# Patient Record
Sex: Male | Born: 1970 | Race: White | Hispanic: No | Marital: Married | Smoking: Current every day smoker
Health system: Southern US, Community
[De-identification: ages and names within clinical notes are randomized; demographics above are authoritative.]

## PROBLEM LIST (undated history)

## (undated) DIAGNOSIS — L309 Dermatitis, unspecified: Secondary | ICD-10-CM

## (undated) DIAGNOSIS — T1491XA Suicide attempt, initial encounter: Secondary | ICD-10-CM

## (undated) DIAGNOSIS — K859 Acute pancreatitis without necrosis or infection, unspecified: Secondary | ICD-10-CM

## (undated) DIAGNOSIS — S82201B Unspecified fracture of shaft of right tibia, initial encounter for open fracture type I or II: Secondary | ICD-10-CM

## (undated) DIAGNOSIS — Z720 Tobacco use: Secondary | ICD-10-CM

## (undated) DIAGNOSIS — Y249XXA Unspecified firearm discharge, undetermined intent, initial encounter: Secondary | ICD-10-CM

## (undated) DIAGNOSIS — Z89519 Acquired absence of unspecified leg below knee: Secondary | ICD-10-CM

## (undated) DIAGNOSIS — F419 Anxiety disorder, unspecified: Secondary | ICD-10-CM

## (undated) DIAGNOSIS — E119 Type 2 diabetes mellitus without complications: Secondary | ICD-10-CM

## (undated) DIAGNOSIS — W3400XA Accidental discharge from unspecified firearms or gun, initial encounter: Secondary | ICD-10-CM

## (undated) DIAGNOSIS — I10 Essential (primary) hypertension: Secondary | ICD-10-CM

## (undated) DIAGNOSIS — S81041A Puncture wound with foreign body, right knee, initial encounter: Secondary | ICD-10-CM

## (undated) DIAGNOSIS — T79A29A Traumatic compartment syndrome of unspecified lower extremity, initial encounter: Secondary | ICD-10-CM

## (undated) HISTORY — PX: BACK SURGERY: SHX140

## (undated) HISTORY — PX: BELOW KNEE LEG AMPUTATION: SUR23

## (undated) HISTORY — PX: OTHER SURGICAL HISTORY: SHX169

---

## 2005-10-11 HISTORY — PX: SPINAL CORD STIMULATOR INSERTION: SHX5378

## 2014-03-30 ENCOUNTER — Emergency Department (HOSPITAL_COMMUNITY)
Admission: EM | Admit: 2014-03-30 | Discharge: 2014-03-30 | Disposition: A | Discharge status: Home / Self Care | Payer: Medicare Other | Attending: Emergency Medicine | Admitting: Emergency Medicine

## 2014-03-30 ENCOUNTER — Encounter (HOSPITAL_COMMUNITY): Payer: Self-pay | Admitting: Emergency Medicine

## 2014-03-30 ENCOUNTER — Emergency Department (HOSPITAL_COMMUNITY): Payer: Medicare Other

## 2014-03-30 DIAGNOSIS — E119 Type 2 diabetes mellitus without complications: Secondary | ICD-10-CM | POA: Insufficient documentation

## 2014-03-30 DIAGNOSIS — F17200 Nicotine dependence, unspecified, uncomplicated: Secondary | ICD-10-CM | POA: Diagnosis not present

## 2014-03-30 DIAGNOSIS — R599 Enlarged lymph nodes, unspecified: Secondary | ICD-10-CM | POA: Diagnosis not present

## 2014-03-30 DIAGNOSIS — Z79899 Other long term (current) drug therapy: Secondary | ICD-10-CM | POA: Insufficient documentation

## 2014-03-30 DIAGNOSIS — I10 Essential (primary) hypertension: Secondary | ICD-10-CM | POA: Diagnosis not present

## 2014-03-30 DIAGNOSIS — Z87828 Personal history of other (healed) physical injury and trauma: Secondary | ICD-10-CM | POA: Diagnosis not present

## 2014-03-30 DIAGNOSIS — M545 Low back pain, unspecified: Secondary | ICD-10-CM | POA: Diagnosis present

## 2014-03-30 DIAGNOSIS — M5430 Sciatica, unspecified side: Secondary | ICD-10-CM | POA: Insufficient documentation

## 2014-03-30 HISTORY — DX: Type 2 diabetes mellitus without complications: E11.9

## 2014-03-30 HISTORY — DX: Acquired absence of unspecified leg below knee: Z89.519

## 2014-03-30 HISTORY — DX: Unspecified firearm discharge, undetermined intent, initial encounter: Y24.9XXA

## 2014-03-30 HISTORY — DX: Accidental discharge from unspecified firearms or gun, initial encounter: W34.00XA

## 2014-03-30 HISTORY — DX: Essential (primary) hypertension: I10

## 2014-03-30 LAB — I-STAT CHEM 8, ED
BUN: 21 mg/dL (ref 6–23)
Calcium, Ion: 1.22 mmol/L (ref 1.12–1.23)
Chloride: 103 mEq/L (ref 96–112)
Creatinine, Ser: 0.9 mg/dL (ref 0.50–1.35)
Glucose, Bld: 156 mg/dL — ABNORMAL HIGH (ref 70–99)
HCT: 43 % (ref 39.0–52.0)
HEMOGLOBIN: 14.6 g/dL (ref 13.0–17.0)
Potassium: 3.5 mEq/L — ABNORMAL LOW (ref 3.7–5.3)
SODIUM: 140 meq/L (ref 137–147)
TCO2: 19 mmol/L (ref 0–100)

## 2014-03-30 LAB — URINALYSIS, ROUTINE W REFLEX MICROSCOPIC
Bilirubin Urine: NEGATIVE
Glucose, UA: NEGATIVE mg/dL
Hgb urine dipstick: NEGATIVE
Ketones, ur: NEGATIVE mg/dL
Leukocytes, UA: NEGATIVE
Nitrite: NEGATIVE
PH: 5.5 (ref 5.0–8.0)
Protein, ur: NEGATIVE mg/dL
Specific Gravity, Urine: 1.027 (ref 1.005–1.030)
UROBILINOGEN UA: 0.2 mg/dL (ref 0.0–1.0)

## 2014-03-30 MED ORDER — ONDANSETRON HCL 4 MG/2ML IJ SOLN
4.0000 mg | Freq: Once | INTRAMUSCULAR | Status: AC
  Administered 2014-03-30: 4 mg via INTRAVENOUS
  Filled 2014-03-30: qty 2

## 2014-03-30 MED ORDER — MORPHINE SULFATE 4 MG/ML IJ SOLN
6.0000 mg | Freq: Once | INTRAMUSCULAR | Status: AC
  Administered 2014-03-30: 6 mg via INTRAVENOUS
  Filled 2014-03-30: qty 2

## 2014-03-30 MED ORDER — HYDROCODONE-ACETAMINOPHEN 5-325 MG PO TABS: 1.0000 | ORAL_TABLET | ORAL | Status: DC | PRN

## 2014-03-30 MED ORDER — SODIUM CHLORIDE 0.9 % IV BOLUS (SEPSIS)
1000.0000 mL | Freq: Once | INTRAVENOUS | Status: AC
  Administered 2014-03-30: 1000 mL via INTRAVENOUS

## 2014-03-30 NOTE — ED Notes (Signed)
Dr. Mesner at bedside   

## 2014-03-30 NOTE — Discharge Instructions (Signed)
°Emergency Department Resource Guide °1) Find a Doctor and Pay Out of Pocket °Although you won't have to find out who is covered by your insurance plan, it is a good idea to ask around and get recommendations. You will then need to call the office and see if the doctor you have chosen will accept you as a new patient and what types of options they offer for patients who are self-pay. Some doctors offer discounts or will set up payment plans for their patients who do not have insurance, but you will need to ask so you aren't surprised when you get to your appointment. ° °2) Contact Your Local Health Department °Not all health departments have doctors that can see patients for sick visits, but many do, so it is worth a call to see if yours does. If you don't know where your local health department is, you can check in your phone book. The CDC also has a tool to help you locate your state's health department, and many state websites also have listings of all of their local health departments. ° °3) Find a Walk-in Clinic °If your illness is not likely to be very severe or complicated, you may want to try a walk in clinic. These are popping up all over the country in pharmacies, drugstores, and shopping centers. They're usually staffed by nurse practitioners or physician assistants that have been trained to treat common illnesses and complaints. They're usually fairly quick and inexpensive. However, if you have serious medical issues or chronic medical problems, these are probably not your best option. ° °No Primary Care Doctor: °- Call [facility] at  832-8000 - they can help you locate a primary care doctor that  accepts your insurance, provides certain services, etc. °- Physician Referral Service- 1-800-533-3463 ° °Chronic Pain Problems: °Organization         Address  Phone   Notes  ° [facility]  (336) 297-2271 Patients need to be referred by their primary care doctor.  ° °Medication  Assistance: °Organization         Address  Phone   Notes  °[place] [facility] [address] °[place], [place] [address] (336) 641-8030 --Must be a resident of [place] °-- Must have NO insurance coverage whatsoever (no Medicaid/ Medicare, etc.) °-- The pt. MUST have a primary care doctor that directs their care regularly and follows them in the community °  °[facility]  (866) 331-1348   °[employer]  (888) 892-1162   ° °Agencies that provide inexpensive medical care: °Organization         Address  Phone   Notes  °[facility]  (336) 832-8035   °[facility]    (336) 832-7272   °[facility] [address] °[place], [place] [address] (336) 832-4777   °[facility] [address], °[place] (336) 271-4999   °[facility]    (336) 373-0678   °[facility]    (336) 272-1050   °[facility] ° [address], [place] Phone:  (336) 832-4444, Fax:  (336) 832-4440 Hours of Operation:  9 am - 6 pm, M-F.  Also accepts Medicaid/Medicare and self-pay.  °[facility] ° [address], [place] Phone: (336) 832-3150, Fax: (336) 832-3151. Hours of Operation:  8:30 am - 5:30 pm, M-F.  Also accepts Medicaid and self-pay.  °[facility] [place] [address], [place] Phone: (336) 878-6027   °[facility] [address], [place], [place] (336)723-1848, Ext. 123 Mondays & Thursdays: 7-9 AM.  First 15 patients are seen on a first come, first serve basis. °  ° °Medicaid-accepting [place] Providers: ° °Organization         Address  Phone   Notes  °[facility] [address], [place] (336) 641-2100 Also accepts self-pay patients.  °[facility] [address], [place] ° (336) 856-9996   °[facility] [address] [place] [address], [place]  (336) 288-8857   °[facility] [address] [place] [address], [place] (336) 299-7000   °Veita Bland [address], [place]  ° (336) 373-1557 Only accepts [place] Access Medicaid patients after they have their name applied to their card.  ° °Self-Pay (no insurance) in [place]: ° °Organization         Address  Phone   Notes  °Sickle Cell Patients, [facility] [address], [place] (336) 832-1970   °[facility] [address], [place] (336) 832-4400   °[facility] ° [address], [place] (336) 992-4800   °[facility]/Dr. Osei-Bonsu ° [address] [place] [address], [place] or [address], [place] (336) 841-8500 Phone number for both [place] and [place] locations is the same.  °[facility] [address], [place] (336) 299-0000   °[facility] [address] [place] [address], [place] or [address] (336) 852-7530 °(336) 878-2260   °[facility] [address], [place] (336) 350-1642, phone; (336) 294-5005, fax Sees patients 1st and 3rd Saturday of every month.  Must not qualify for public or private insurance (i.e. Medicaid, Medicare, [place] Health Choice, Veterans' Benefits) • Household income should be no more than 200% of the poverty level •The clinic cannot treat you if you are pregnant or think you are pregnant • Sexually transmitted diseases are not treated at the clinic.  ° ° °Dental Care: °Organization         Address  Phone  Notes  °[place] [facility] [address], [place] (336) 641-6152 Accepts children up to age 21 who are enrolled in Medicaid or [place] Health Choice; pregnant women with a Medicaid card; and children who have applied for Medicaid or [place] Health Choice, but were declined, whose parents can pay a reduced fee at time of service.  °[place]  [facility] [place]  [address], [place] (336) 641-7733 Accepts children up to age 21 who are enrolled in Medicaid or [place] Health Choice; pregnant women with a Medicaid card; and children who have applied for Medicaid or [place] Health Choice, but were declined, whose parents can pay a reduced fee at time of service.  °[facility] ° [address],  (336) 641-4533 Patients are seen by appointment only. Walk-ins are not accepted. [facility] will see patients 18 years of age and older. °Monday - Tuesday (8am-5pm) °Most Wednesdays (8:30-5pm) °$30 per visit, cash only  °[facility] ° [address], [place] (336) 641-4533 Patients are seen by appointment only. Walk-ins are not accepted. [facility] will see patients 18 years of age and older. °One   Wednesday Evening (Monthly: Volunteer Based).  $30 per visit, cash only  °[facility]  (919) 537-3737 for adults; Children under age 4, call Graduate Pediatric Dentistry at (919) [phone]. Children aged 4-14, please call (919) [phone] to request a pediatric application. ° Dental services are provided in all areas of dental care including fillings, crowns and bridges, complete and partial dentures, implants, gum treatment, root canals, and extractions. Preventive care is also provided. Treatment is provided to both adults and children. °Patients are selected via a lottery and there is often a waiting list. °  °[facility] [address], °[place] ° (336) 763-8833 [email or web address] °  °[facility] [address], [place], [place] (336)723-1848, Ext. 123 Second and Fourth Thursday of each month, opens at 6:30 AM; Clinic ends at 9 AM.  Patients are seen on a first-come first-served basis, and a limited number are seen during each clinic.  ° °[facility] ° [address], [place], [place] (336) 723-7904    Eligibility Requirements °You must have lived in [place], [place], or [place] counties for at least the last three months. °  You cannot be eligible for state or federal sponsored healthcare insurance, including Veterans Administration, Medicaid, or Medicare. °  You generally cannot be eligible for healthcare insurance through your employer.  °  How to apply: °Eligibility screenings are held every Tuesday and Wednesday afternoon from 1:00 pm until 4:00 pm. You do not need an appointment for the interview!  °[facility] [address], [place], [place] 336-631-2330   °[place] [facility]  336-342-8273   °[place]  336-703-3100   ° [facility]  336-570-6415   ° °Behavioral Health Resources in the Community: °Intensive Outpatient Programs °Organization         Address  Phone  Notes  °[facility] [address], [place], [place] 336-878-6098   °[facility] [address], [place], [place] 336-832-9800   °[facility] [address], [place], [place] ° 336-882-2125   °[facility] [address],  °[place], [place] 1-800-853-5163 or 336-641-4981   °Substance Abuse Resources °Organization         Address  Phone  Notes  °[facility]  336-882-2125   °[facility]  336-784-9470   °[facility]  336-285-9073   °[facility]  336-845-3988   °Residential & Outpatient Substance Abuse Program  1-800-659-3381   °Psychological Services °Organization         Address  Phone  Notes  °[facility]  336- 832-9600   °[facility]  336- 378-7881   °[facility] [address], [place] 1-800-853-5163 or 336-641-4981   ° °Mobile Crisis Teams °Organization         Address  Phone  Notes  °[facility], Mobile Crisis Care Unit  1-877-626-1772   °Assertive °[facility] ° [address]  [place], [place] 336-834-9664   °Sharon DeEsch [address] °[place] 336-554-5454   ° °Self-Help/Support Groups °Organization         Address  Phone             Notes  °[facility]  - variety of support groups  336- 373-1402 Call for more information  °Narcotics Anonymous (NA), [facility] [address], °[place]  2 meetings at this location  ° °  Residential Treatment Programs °Organization         Address  Phone  Notes  °[facility] [address],    °[place]  1-866-801-8205   °[facility] ° [address], [place], [place] 704-293-8524   °[facility] [address], [place] 336-845-3988 Admissions: 8am-3pm M-F  °[facility] [address]    °[place], [place] 336-841-1104   °[facility] [address], [place], [place] 336-379-7146   °[facility] [address]  °[place], [place] 336-285-9073   °[facility] Intensive Outpatient [address], [place], [place] 336-852-3033   °[facility] [address]  °[place], [place] 1-877-615-2722 or 336-784-9470   °[facility] [address] [place], [place] 336-227-7417 Accepts Medicaid  °[facility] [address]  ° [place] 1-800-659-3381 Substance Abuse/Addiction Treatment  ° °[place] Behavioral Health Resources °Organization         Address  Phone  Notes  °[facility]  (888) 581-9988   °Julie Brannon, PhD [address] [place], [place]   (336) 349-5553 or (336) 951-0000   °[facility]   [address] °[place], [place] (336) 349-4454   °[facility] [address], [place], [place] (336) 342-8316 Insurance/Medicaid/sponsorship through [facility]  °[facility] [address]                                    [place], [place] (336) 342-8316 Therapy/tele-psych/case    °[facility] [address]  ° [place], [place] (336) 349-2233    °Dr. Arfeen  (336) 349-4544   °[facility] [place]  [employer] [place] [facility] 1) [address], [place] °2) [address], [place] °3)  [address], [place] (336) 349-3220 °(336) 342-7768 ° °(336) 342-8140   °[place] [facility] (336) 342-1394 or (336) 342-3537 (After Hours)    ° ° °

## 2014-03-30 NOTE — ED Notes (Signed)
Pt reports non-radiating left lower back pain. States hx of back problems but states "this feels different. I am worried it could be my kidney." Pt denies urinary symptoms. Pt denies anything back pain worse/better. Took Norco 5-325 at 1100 today with no relief. Denies N/V/D, fever or chills. NAD.
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
## 2014-03-31 NOTE — ED Provider Notes (Signed)
I saw and evaluated the patient, reviewed the resident's note and I agree with the findings and plan.   EKG Interpretation None      Acute onset L flank pain - no fevers or vomiting. CT scan negative for stone. Stable for discharge.  Dagmar HaitWilliam Blair Walden, MD 03/31/14 [id number]

## 2014-07-18 ENCOUNTER — Ambulatory Visit: Payer: Self-pay | Admitting: Internal Medicine

## 2014-07-18 LAB — CBC CANCER CENTER
BASOS ABS: 0.1 x10 3/mm (ref 0.0–0.1)
Basophil %: 0.8 %
Eosinophil #: 0.1 x10 3/mm (ref 0.0–0.7)
Eosinophil %: 1.2 %
HCT: 47.9 % (ref 40.0–52.0)
HGB: 16.4 g/dL (ref 13.0–18.0)
LYMPHS PCT: 25.5 %
Lymphocyte #: 2.1 x10 3/mm (ref 1.0–3.6)
MCH: 31.2 pg (ref 26.0–34.0)
MCHC: 34.2 g/dL (ref 32.0–36.0)
MCV: 91 fL (ref 80–100)
MONO ABS: 0.7 x10 3/mm (ref 0.2–1.0)
Monocyte %: 8.3 %
NEUTROS PCT: 64.2 %
Neutrophil #: 5.4 x10 3/mm (ref 1.4–6.5)
PLATELETS: 228 x10 3/mm (ref 150–440)
RBC: 5.25 10*6/uL (ref 4.40–5.90)
RDW: 13.4 % (ref 11.5–14.5)
WBC: 8.4 x10 3/mm (ref 3.8–10.6)

## 2014-07-18 LAB — IRON AND TIBC
IRON SATURATION: 22 %
Iron Bind.Cap.(Total): 403 ug/dL (ref 250–450)
Iron: 87 ug/dL (ref 65–175)
UNBOUND IRON-BIND. CAP.: 316 ug/dL

## 2014-07-18 LAB — FERRITIN: Ferritin (ARMC): 45 ng/mL (ref 8–388)

## 2014-08-11 ENCOUNTER — Ambulatory Visit: Payer: Self-pay | Admitting: Internal Medicine

## 2014-08-28 ENCOUNTER — Ambulatory Visit: Payer: Self-pay | Admitting: Pain Medicine

## 2014-09-12 ENCOUNTER — Ambulatory Visit: Payer: Self-pay | Admitting: Pain Medicine

## 2014-09-12 LAB — HEPATIC FUNCTION PANEL A (ARMC)
ALK PHOS: 46 U/L
Albumin: 4.2 g/dL (ref 3.4–5.0)
Bilirubin, Direct: <0.1 mg/dL (ref 0.0–0.2)
Bilirubin,Total: 0.5 mg/dL (ref 0.2–1.0)
SGOT(AST): 35 U/L (ref 15–37)
SGPT (ALT): 63 U/L
TOTAL PROTEIN: 7.9 g/dL (ref 6.4–8.2)

## 2014-09-12 LAB — BASIC METABOLIC PANEL
Anion Gap: 9 (ref 7–16)
BUN: 16 mg/dL (ref 7–18)
CO2: 26 mmol/L (ref 21–32)
Calcium, Total: 9.6 mg/dL (ref 8.5–10.1)
Chloride: 103 mmol/L (ref 98–107)
Creatinine: 0.89 mg/dL (ref 0.60–1.30)
EGFR (African American): >60
GLUCOSE: 175 mg/dL — AB (ref 65–99)
OSMOLALITY: 281 (ref 275–301)
Potassium: 3.7 mmol/L (ref 3.5–5.1)
SODIUM: 138 mmol/L (ref 136–145)

## 2014-09-12 LAB — SEDIMENTATION RATE: Erythrocyte Sed Rate: 6 mm/hr (ref 0–15)

## 2014-09-12 LAB — MAGNESIUM: Magnesium: 1.4 mg/dL — ABNORMAL LOW

## 2014-10-21 ENCOUNTER — Encounter (HOSPITAL_COMMUNITY): Payer: Self-pay | Admitting: Emergency Medicine

## 2014-10-21 ENCOUNTER — Emergency Department (HOSPITAL_COMMUNITY)
Admission: EM | Admit: 2014-10-21 | Discharge: 2014-10-21 | Disposition: A | Discharge status: Home / Self Care | Payer: Medicare Other | Attending: Emergency Medicine | Admitting: Emergency Medicine

## 2014-10-21 ENCOUNTER — Emergency Department (HOSPITAL_COMMUNITY): Payer: Medicare Other

## 2014-10-21 DIAGNOSIS — Z79899 Other long term (current) drug therapy: Secondary | ICD-10-CM | POA: Insufficient documentation

## 2014-10-21 DIAGNOSIS — Z87828 Personal history of other (healed) physical injury and trauma: Secondary | ICD-10-CM | POA: Insufficient documentation

## 2014-10-21 DIAGNOSIS — S8992XA Unspecified injury of left lower leg, initial encounter: Secondary | ICD-10-CM | POA: Insufficient documentation

## 2014-10-21 DIAGNOSIS — Y92410 Unspecified street and highway as the place of occurrence of the external cause: Secondary | ICD-10-CM | POA: Insufficient documentation

## 2014-10-21 DIAGNOSIS — X58XXXA Exposure to other specified factors, initial encounter: Secondary | ICD-10-CM | POA: Insufficient documentation

## 2014-10-21 DIAGNOSIS — Z89512 Acquired absence of left leg below knee: Secondary | ICD-10-CM | POA: Insufficient documentation

## 2014-10-21 DIAGNOSIS — Z720 Tobacco use: Secondary | ICD-10-CM | POA: Insufficient documentation

## 2014-10-21 DIAGNOSIS — I10 Essential (primary) hypertension: Secondary | ICD-10-CM | POA: Insufficient documentation

## 2014-10-21 DIAGNOSIS — E119 Type 2 diabetes mellitus without complications: Secondary | ICD-10-CM | POA: Insufficient documentation

## 2014-10-21 DIAGNOSIS — Y998 Other external cause status: Secondary | ICD-10-CM | POA: Insufficient documentation

## 2014-10-21 DIAGNOSIS — Y9389 Activity, other specified: Secondary | ICD-10-CM | POA: Insufficient documentation

## 2014-10-21 DIAGNOSIS — M25562 Pain in left knee: Secondary | ICD-10-CM

## 2014-10-21 MED ORDER — HYDROCODONE-ACETAMINOPHEN 5-325 MG PO TABS: 1.0000 | ORAL_TABLET | ORAL | Status: DC | PRN

## 2014-10-21 MED ORDER — KETOROLAC TROMETHAMINE 30 MG/ML IJ SOLN
30.0000 mg | Freq: Once | INTRAMUSCULAR | Status: AC
  Administered 2014-10-21: 30 mg via INTRAMUSCULAR
  Filled 2014-10-21: qty 1

## 2014-10-21 MED ORDER — OXYCODONE-ACETAMINOPHEN 5-325 MG PO TABS
2.0000 | ORAL_TABLET | Freq: Once | ORAL | Status: AC
  Administered 2014-10-21: 2 via ORAL
  Filled 2014-10-21: qty 2
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
## 2014-11-17 ENCOUNTER — Emergency Department (HOSPITAL_COMMUNITY): Payer: Federal, State, Local not specified - PPO

## 2014-11-17 ENCOUNTER — Inpatient Hospital Stay (HOSPITAL_COMMUNITY)
Admission: EM | Admit: 2014-11-17 | Discharge: 2014-12-02 | DRG: 907 | Disposition: A | Discharge status: Rehabilitation Facility | Payer: Federal, State, Local not specified - PPO | Attending: Orthopedic Surgery | Admitting: Orthopedic Surgery

## 2014-11-17 ENCOUNTER — Encounter (HOSPITAL_COMMUNITY): Payer: Self-pay | Admitting: Adult Health

## 2014-11-17 DIAGNOSIS — S81041A Puncture wound with foreign body, right knee, initial encounter: Secondary | ICD-10-CM

## 2014-11-17 DIAGNOSIS — S82201A Unspecified fracture of shaft of right tibia, initial encounter for closed fracture: Secondary | ICD-10-CM | POA: Diagnosis present

## 2014-11-17 DIAGNOSIS — T79A21A Traumatic compartment syndrome of right lower extremity, initial encounter: Principal | ICD-10-CM

## 2014-11-17 DIAGNOSIS — Z8781 Personal history of (healed) traumatic fracture: Secondary | ICD-10-CM

## 2014-11-17 DIAGNOSIS — F17210 Nicotine dependence, cigarettes, uncomplicated: Secondary | ICD-10-CM | POA: Diagnosis present

## 2014-11-17 DIAGNOSIS — F329 Major depressive disorder, single episode, unspecified: Secondary | ICD-10-CM | POA: Diagnosis present

## 2014-11-17 DIAGNOSIS — E119 Type 2 diabetes mellitus without complications: Secondary | ICD-10-CM | POA: Diagnosis present

## 2014-11-17 DIAGNOSIS — R339 Retention of urine, unspecified: Secondary | ICD-10-CM | POA: Diagnosis present

## 2014-11-17 DIAGNOSIS — X749XXA Intentional self-harm by unspecified firearm discharge, initial encounter: Secondary | ICD-10-CM

## 2014-11-17 DIAGNOSIS — S82201B Unspecified fracture of shaft of right tibia, initial encounter for open fracture type I or II: Secondary | ICD-10-CM

## 2014-11-17 DIAGNOSIS — E1140 Type 2 diabetes mellitus with diabetic neuropathy, unspecified: Secondary | ICD-10-CM | POA: Diagnosis present

## 2014-11-17 DIAGNOSIS — S52502A Unspecified fracture of the lower end of left radius, initial encounter for closed fracture: Secondary | ICD-10-CM

## 2014-11-17 DIAGNOSIS — Z89512 Acquired absence of left leg below knee: Secondary | ICD-10-CM

## 2014-11-17 DIAGNOSIS — S82143A Displaced bicondylar fracture of unspecified tibia, initial encounter for closed fracture: Secondary | ICD-10-CM

## 2014-11-17 DIAGNOSIS — T1491XA Suicide attempt, initial encounter: Secondary | ICD-10-CM | POA: Diagnosis present

## 2014-11-17 DIAGNOSIS — S82201C Unspecified fracture of shaft of right tibia, initial encounter for open fracture type IIIA, IIIB, or IIIC: Secondary | ICD-10-CM | POA: Diagnosis present

## 2014-11-17 DIAGNOSIS — IMO0002 Reserved for concepts with insufficient information to code with codable children: Secondary | ICD-10-CM | POA: Diagnosis present

## 2014-11-17 DIAGNOSIS — Z79899 Other long term (current) drug therapy: Secondary | ICD-10-CM

## 2014-11-17 DIAGNOSIS — I10 Essential (primary) hypertension: Secondary | ICD-10-CM | POA: Diagnosis present

## 2014-11-17 DIAGNOSIS — E1142 Type 2 diabetes mellitus with diabetic polyneuropathy: Secondary | ICD-10-CM | POA: Diagnosis present

## 2014-11-17 DIAGNOSIS — Z9889 Other specified postprocedural states: Secondary | ICD-10-CM

## 2014-11-17 DIAGNOSIS — S52509A Unspecified fracture of the lower end of unspecified radius, initial encounter for closed fracture: Secondary | ICD-10-CM | POA: Insufficient documentation

## 2014-11-17 DIAGNOSIS — D62 Acute posthemorrhagic anemia: Secondary | ICD-10-CM | POA: Diagnosis not present

## 2014-11-17 DIAGNOSIS — T148XXA Other injury of unspecified body region, initial encounter: Secondary | ICD-10-CM

## 2014-11-17 DIAGNOSIS — G894 Chronic pain syndrome: Secondary | ICD-10-CM | POA: Diagnosis present

## 2014-11-17 DIAGNOSIS — M2500 Hemarthrosis, unspecified joint: Secondary | ICD-10-CM | POA: Diagnosis present

## 2014-11-17 DIAGNOSIS — T79A29A Traumatic compartment syndrome of unspecified lower extremity, initial encounter: Secondary | ICD-10-CM | POA: Diagnosis present

## 2014-11-17 DIAGNOSIS — E1165 Type 2 diabetes mellitus with hyperglycemia: Secondary | ICD-10-CM | POA: Diagnosis present

## 2014-11-17 DIAGNOSIS — Z419 Encounter for procedure for purposes other than remedying health state, unspecified: Secondary | ICD-10-CM

## 2014-11-17 HISTORY — DX: Traumatic compartment syndrome of unspecified lower extremity, initial encounter: T79.A29A

## 2014-11-17 HISTORY — DX: Puncture wound with foreign body, right knee, initial encounter: S81.041A

## 2014-11-17 HISTORY — DX: Unspecified fracture of shaft of right tibia, initial encounter for open fracture type I or II: S82.201B

## 2014-11-17 LAB — CBC WITH DIFFERENTIAL/PLATELET
Basophils Absolute: 0.1 10*3/uL (ref 0.0–0.1)
Basophils Relative: 1 % (ref 0–1)
EOS ABS: 0.1 10*3/uL (ref 0.0–0.7)
Eosinophils Relative: 1 % (ref 0–5)
HCT: 43 % (ref 39.0–52.0)
HEMOGLOBIN: 15.6 g/dL (ref 13.0–17.0)
Lymphocytes Relative: 29 % (ref 12–46)
Lymphs Abs: 2.8 10*3/uL (ref 0.7–4.0)
MCH: 31.9 pg (ref 26.0–34.0)
MCHC: 36.3 g/dL — ABNORMAL HIGH (ref 30.0–36.0)
MCV: 87.9 fL (ref 78.0–100.0)
MONO ABS: 0.7 10*3/uL (ref 0.1–1.0)
Monocytes Relative: 7 % (ref 3–12)
NEUTROS ABS: 6 10*3/uL (ref 1.7–7.7)
NEUTROS PCT: 62 % (ref 43–77)
Platelets: 265 10*3/uL (ref 150–400)
RBC: 4.89 MIL/uL (ref 4.22–5.81)
RDW: 13.2 % (ref 11.5–15.5)
WBC: 9.7 10*3/uL (ref 4.0–10.5)

## 2014-11-17 LAB — BASIC METABOLIC PANEL
Anion gap: 10 (ref 5–15)
BUN: 17 mg/dL (ref 6–23)
CALCIUM: 9.8 mg/dL (ref 8.4–10.5)
CO2: 30 mmol/L (ref 19–32)
Chloride: 101 mmol/L (ref 96–112)
Creatinine, Ser: 1.19 mg/dL (ref 0.50–1.35)
GFR calc Af Amer: 84 mL/min — ABNORMAL LOW (ref >90)
GFR calc non Af Amer: 72 mL/min — ABNORMAL LOW (ref >90)
Glucose, Bld: 164 mg/dL — ABNORMAL HIGH (ref 70–99)
Potassium: 3.8 mmol/L (ref 3.5–5.1)
Sodium: 141 mmol/L (ref 135–145)

## 2014-11-17 LAB — ETHANOL

## 2014-11-17 LAB — SALICYLATE LEVEL: Salicylate Lvl: <4 mg/dL (ref 2.8–20.0)

## 2014-11-17 LAB — ACETAMINOPHEN LEVEL: Acetaminophen (Tylenol), Serum: <10 ug/mL — ABNORMAL LOW (ref 10–30)

## 2014-11-17 MED ORDER — MORPHINE SULFATE 4 MG/ML IJ SOLN
8.0000 mg | Freq: Once | INTRAMUSCULAR | Status: AC
  Administered 2014-11-17: 8 mg via INTRAVENOUS
  Filled 2014-11-17: qty 2

## 2014-11-17 MED ORDER — SODIUM CHLORIDE 0.9 % IV BOLUS (SEPSIS)
1000.0000 mL | Freq: Once | INTRAVENOUS | Status: AC
  Administered 2014-11-17: 1000 mL via INTRAVENOUS

## 2014-11-17 MED ORDER — CEFAZOLIN SODIUM 1-5 GM-% IV SOLN
1.0000 g | Freq: Once | INTRAVENOUS | Status: AC
  Administered 2014-11-17: 1 g via INTRAVENOUS
  Filled 2014-11-17: qty 50
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
## 2014-11-17 NOTE — ED Notes (Signed)
Ortho Tech called for short arm splint.

## 2014-11-18 ENCOUNTER — Encounter (HOSPITAL_COMMUNITY)
Admission: EM | Disposition: A | Discharge status: Rehabilitation Facility | Payer: Self-pay | Source: Home / Self Care | Attending: Orthopedic Surgery

## 2014-11-18 ENCOUNTER — Inpatient Hospital Stay (HOSPITAL_COMMUNITY): Payer: Federal, State, Local not specified - PPO

## 2014-11-18 ENCOUNTER — Emergency Department (HOSPITAL_COMMUNITY): Payer: Federal, State, Local not specified - PPO | Admitting: Anesthesiology

## 2014-11-18 ENCOUNTER — Encounter (HOSPITAL_COMMUNITY): Payer: Self-pay | Admitting: Orthopedic Surgery

## 2014-11-18 DIAGNOSIS — D62 Acute posthemorrhagic anemia: Secondary | ICD-10-CM | POA: Diagnosis not present

## 2014-11-18 DIAGNOSIS — S52502A Unspecified fracture of the lower end of left radius, initial encounter for closed fracture: Secondary | ICD-10-CM | POA: Diagnosis present

## 2014-11-18 DIAGNOSIS — S52501S Unspecified fracture of the lower end of right radius, sequela: Secondary | ICD-10-CM | POA: Diagnosis not present

## 2014-11-18 DIAGNOSIS — R339 Retention of urine, unspecified: Secondary | ICD-10-CM | POA: Diagnosis present

## 2014-11-18 DIAGNOSIS — Z89512 Acquired absence of left leg below knee: Secondary | ICD-10-CM | POA: Diagnosis not present

## 2014-11-18 DIAGNOSIS — T79A21A Traumatic compartment syndrome of right lower extremity, initial encounter: Secondary | ICD-10-CM | POA: Diagnosis present

## 2014-11-18 DIAGNOSIS — S82191S Other fracture of upper end of right tibia, sequela: Secondary | ICD-10-CM | POA: Diagnosis not present

## 2014-11-18 DIAGNOSIS — M2500 Hemarthrosis, unspecified joint: Secondary | ICD-10-CM | POA: Diagnosis present

## 2014-11-18 DIAGNOSIS — S82201A Unspecified fracture of shaft of right tibia, initial encounter for closed fracture: Secondary | ICD-10-CM | POA: Diagnosis present

## 2014-11-18 DIAGNOSIS — X749XXA Intentional self-harm by unspecified firearm discharge, initial encounter: Secondary | ICD-10-CM | POA: Diagnosis not present

## 2014-11-18 DIAGNOSIS — S81041A Puncture wound with foreign body, right knee, initial encounter: Secondary | ICD-10-CM

## 2014-11-18 DIAGNOSIS — S52509S Unspecified fracture of the lower end of unspecified radius, sequela: Secondary | ICD-10-CM | POA: Diagnosis not present

## 2014-11-18 DIAGNOSIS — T79A21S Traumatic compartment syndrome of right lower extremity, sequela: Secondary | ICD-10-CM | POA: Diagnosis not present

## 2014-11-18 DIAGNOSIS — E119 Type 2 diabetes mellitus without complications: Secondary | ICD-10-CM | POA: Diagnosis present

## 2014-11-18 DIAGNOSIS — Z79899 Other long term (current) drug therapy: Secondary | ICD-10-CM | POA: Diagnosis not present

## 2014-11-18 DIAGNOSIS — T79A29A Traumatic compartment syndrome of unspecified lower extremity, initial encounter: Secondary | ICD-10-CM

## 2014-11-18 DIAGNOSIS — R45851 Suicidal ideations: Secondary | ICD-10-CM | POA: Diagnosis not present

## 2014-11-18 DIAGNOSIS — I10 Essential (primary) hypertension: Secondary | ICD-10-CM | POA: Diagnosis present

## 2014-11-18 DIAGNOSIS — S82141S Displaced bicondylar fracture of right tibia, sequela: Secondary | ICD-10-CM | POA: Diagnosis not present

## 2014-11-18 DIAGNOSIS — G894 Chronic pain syndrome: Secondary | ICD-10-CM | POA: Diagnosis present

## 2014-11-18 DIAGNOSIS — F17210 Nicotine dependence, cigarettes, uncomplicated: Secondary | ICD-10-CM | POA: Diagnosis present

## 2014-11-18 DIAGNOSIS — E1142 Type 2 diabetes mellitus with diabetic polyneuropathy: Secondary | ICD-10-CM | POA: Diagnosis present

## 2014-11-18 DIAGNOSIS — S82201C Unspecified fracture of shaft of right tibia, initial encounter for open fracture type IIIA, IIIB, or IIIC: Secondary | ICD-10-CM | POA: Diagnosis present

## 2014-11-18 DIAGNOSIS — S52502S Unspecified fracture of the lower end of left radius, sequela: Secondary | ICD-10-CM | POA: Diagnosis not present

## 2014-11-18 DIAGNOSIS — E1165 Type 2 diabetes mellitus with hyperglycemia: Secondary | ICD-10-CM | POA: Diagnosis present

## 2014-11-18 DIAGNOSIS — S82201B Unspecified fracture of shaft of right tibia, initial encounter for open fracture type I or II: Secondary | ICD-10-CM

## 2014-11-18 DIAGNOSIS — F329 Major depressive disorder, single episode, unspecified: Secondary | ICD-10-CM | POA: Diagnosis present

## 2014-11-18 HISTORY — PX: EXTERNAL FIXATION LEG: SHX1549

## 2014-11-18 HISTORY — DX: Puncture wound with foreign body, right knee, initial encounter: S81.041A

## 2014-11-18 HISTORY — DX: Unspecified fracture of shaft of right tibia, initial encounter for open fracture type I or II: S82.201B

## 2014-11-18 HISTORY — DX: Traumatic compartment syndrome of unspecified lower extremity, initial encounter: T79.A29A

## 2014-11-18 HISTORY — PX: FASCIOTOMY: SHX132

## 2014-11-18 LAB — GLUCOSE, CAPILLARY
GLUCOSE-CAPILLARY: 167 mg/dL — AB (ref 70–99)
Glucose-Capillary: 211 mg/dL — ABNORMAL HIGH (ref 70–99)

## 2014-11-18 LAB — BASIC METABOLIC PANEL
ANION GAP: 9 (ref 5–15)
BUN: 19 mg/dL (ref 6–23)
CALCIUM: 8.2 mg/dL — AB (ref 8.4–10.5)
CO2: 24 mmol/L (ref 19–32)
CREATININE: 1.14 mg/dL (ref 0.50–1.35)
Chloride: 103 mmol/L (ref 96–112)
GFR calc Af Amer: 88 mL/min — ABNORMAL LOW (ref >90)
GFR calc non Af Amer: 76 mL/min — ABNORMAL LOW (ref >90)
Glucose, Bld: 233 mg/dL — ABNORMAL HIGH (ref 70–99)
Potassium: 3.7 mmol/L (ref 3.5–5.1)
SODIUM: 136 mmol/L (ref 135–145)

## 2014-11-18 LAB — SURGICAL PCR SCREEN
MRSA, PCR: NEGATIVE
Staphylococcus aureus: NEGATIVE

## 2014-11-18 SURGERY — FASCIOTOMY, UPPER EXTREMITY
Anesthesia: General | Site: Leg Lower | Laterality: Right

## 2014-11-18 MED ORDER — DIPHENHYDRAMINE HCL 12.5 MG/5ML PO ELIX
12.5000 mg | ORAL_SOLUTION | ORAL | Status: DC | PRN
  Administered 2014-11-24: 25 mg via ORAL
  Filled 2014-11-18: qty 10

## 2014-11-18 MED ORDER — METHOCARBAMOL 1000 MG/10ML IJ SOLN
500.0000 mg | Freq: Four times a day (QID) | INTRAVENOUS | Status: DC | PRN
  Filled 2014-11-18: qty 5

## 2014-11-18 MED ORDER — MIDAZOLAM HCL 2 MG/2ML IJ SOLN
INTRAMUSCULAR | Status: AC
  Filled 2014-11-18: qty 2

## 2014-11-18 MED ORDER — ONDANSETRON HCL 4 MG/2ML IJ SOLN
4.0000 mg | Freq: Four times a day (QID) | INTRAMUSCULAR | Status: DC | PRN
  Administered 2014-11-22: 4 mg via INTRAVENOUS
  Filled 2014-11-18: qty 2

## 2014-11-18 MED ORDER — POTASSIUM CHLORIDE IN NACL 20-0.45 MEQ/L-% IV SOLN
INTRAVENOUS | Status: DC
  Administered 2014-11-18 – 2014-11-26 (×8): via INTRAVENOUS
  Filled 2014-11-18 (×20): qty 1000

## 2014-11-18 MED ORDER — PROPOFOL 10 MG/ML IV BOLUS
INTRAVENOUS | Status: DC | PRN
  Administered 2014-11-18: 150 mg via INTRAVENOUS

## 2014-11-18 MED ORDER — SODIUM CHLORIDE 0.9 % IV SOLN
INTRAVENOUS | Status: DC | PRN
  Administered 2014-11-18: 01:00:00 via INTRAVENOUS

## 2014-11-18 MED ORDER — OXYCODONE-ACETAMINOPHEN 5-325 MG PO TABS
1.0000 | ORAL_TABLET | ORAL | Status: DC | PRN
  Administered 2014-11-19 – 2014-12-02 (×56): 2 via ORAL
  Filled 2014-11-18 (×58): qty 2

## 2014-11-18 MED ORDER — MEPERIDINE HCL 25 MG/ML IJ SOLN
INTRAMUSCULAR | Status: AC
  Filled 2014-11-18: qty 1

## 2014-11-18 MED ORDER — BISACODYL 10 MG RE SUPP
10.0000 mg | Freq: Every day | RECTAL | Status: DC | PRN
  Administered 2014-11-22 – 2014-11-24 (×2): 10 mg via RECTAL
  Filled 2014-11-18 (×6): qty 1

## 2014-11-18 MED ORDER — SODIUM CHLORIDE 0.9 % IR SOLN
Status: DC | PRN
  Administered 2014-11-18: 9000 mL
  Administered 2014-11-18: 1000 mL

## 2014-11-18 MED ORDER — METOCLOPRAMIDE HCL 10 MG PO TABS: 5.0000 mg | ORAL_TABLET | Freq: Three times a day (TID) | ORAL | Status: DC | PRN

## 2014-11-18 MED ORDER — CEFAZOLIN SODIUM-DEXTROSE 2-3 GM-% IV SOLR
2.0000 g | INTRAVENOUS | Status: AC
  Administered 2014-11-18: 2 g via INTRAVENOUS

## 2014-11-18 MED ORDER — FENTANYL CITRATE 0.05 MG/ML IJ SOLN
INTRAMUSCULAR | Status: DC | PRN
  Administered 2014-11-18: 100 ug via INTRAVENOUS

## 2014-11-18 MED ORDER — HYDROMORPHONE HCL 1 MG/ML IJ SOLN
0.5000 mg | INTRAMUSCULAR | Status: DC | PRN
  Administered 2014-11-18 – 2014-11-21 (×23): 1 mg via INTRAVENOUS
  Administered 2014-11-21: 0.5 mg via INTRAVENOUS
  Administered 2014-11-21 – 2014-12-02 (×46): 1 mg via INTRAVENOUS
  Filled 2014-11-18 (×73): qty 1

## 2014-11-18 MED ORDER — DOCUSATE SODIUM 100 MG PO CAPS
100.0000 mg | ORAL_CAPSULE | Freq: Two times a day (BID) | ORAL | Status: DC
  Administered 2014-11-18 – 2014-12-02 (×25): 100 mg via ORAL
  Filled 2014-11-18 (×30): qty 1

## 2014-11-18 MED ORDER — VANCOMYCIN HCL IN DEXTROSE 1-5 GM/200ML-% IV SOLN
1000.0000 mg | Freq: Two times a day (BID) | INTRAVENOUS | Status: AC
  Administered 2014-11-18 – 2014-11-21 (×6): 1000 mg via INTRAVENOUS
  Filled 2014-11-18 (×7): qty 200

## 2014-11-18 MED ORDER — ONDANSETRON HCL 4 MG PO TABS: 4.0000 mg | ORAL_TABLET | Freq: Four times a day (QID) | ORAL | Status: DC | PRN

## 2014-11-18 MED ORDER — HYDROMORPHONE HCL 1 MG/ML IJ SOLN
0.2500 mg | INTRAMUSCULAR | Status: DC | PRN
  Administered 2014-11-18 (×3): 0.5 mg via INTRAVENOUS

## 2014-11-18 MED ORDER — METOCLOPRAMIDE HCL 5 MG/ML IJ SOLN
5.0000 mg | Freq: Three times a day (TID) | INTRAMUSCULAR | Status: DC | PRN
  Administered 2014-11-22: 10 mg via INTRAVENOUS
  Filled 2014-11-18: qty 2

## 2014-11-18 MED ORDER — HYDROMORPHONE HCL 1 MG/ML IJ SOLN
INTRAMUSCULAR | Status: AC
  Filled 2014-11-18: qty 1

## 2014-11-18 MED ORDER — FENTANYL CITRATE 0.05 MG/ML IJ SOLN
100.0000 ug | Freq: Once | INTRAMUSCULAR | Status: AC
  Administered 2014-11-18: 100 ug via INTRAVENOUS
  Filled 2014-11-18: qty 2

## 2014-11-18 MED ORDER — LIDOCAINE HCL (CARDIAC) 20 MG/ML IV SOLN
INTRAVENOUS | Status: AC
  Filled 2014-11-18: qty 5

## 2014-11-18 MED ORDER — MEPERIDINE HCL 25 MG/ML IJ SOLN
6.2500 mg | INTRAMUSCULAR | Status: DC | PRN
  Administered 2014-11-18: 12.5 mg via INTRAVENOUS

## 2014-11-18 MED ORDER — INSULIN ASPART 100 UNIT/ML ~~LOC~~ SOLN
0.0000 [IU] | Freq: Three times a day (TID) | SUBCUTANEOUS | Status: DC
  Administered 2014-11-18 – 2014-11-19 (×4): 5 [IU] via SUBCUTANEOUS
  Administered 2014-11-19 – 2014-11-20 (×2): 8 [IU] via SUBCUTANEOUS
  Administered 2014-11-20: 100 [IU] via SUBCUTANEOUS
  Administered 2014-11-20: 5 [IU] via SUBCUTANEOUS
  Administered 2014-11-21: 3 [IU] via SUBCUTANEOUS
  Administered 2014-11-21 – 2014-11-24 (×9): 5 [IU] via SUBCUTANEOUS
  Administered 2014-11-24: 8 [IU] via SUBCUTANEOUS
  Administered 2014-11-25 (×2): 5 [IU] via SUBCUTANEOUS
  Administered 2014-11-25: 3 [IU] via SUBCUTANEOUS
  Administered 2014-11-26: 5 [IU] via SUBCUTANEOUS
  Administered 2014-11-26: 3 [IU] via SUBCUTANEOUS
  Administered 2014-11-27 (×2): 8 [IU] via SUBCUTANEOUS

## 2014-11-18 MED ORDER — FENTANYL CITRATE 0.05 MG/ML IJ SOLN
INTRAMUSCULAR | Status: AC
  Filled 2014-11-18: qty 5

## 2014-11-18 MED ORDER — ALPRAZOLAM 0.5 MG PO TABS
0.5000 mg | ORAL_TABLET | Freq: Three times a day (TID) | ORAL | Status: DC | PRN
  Administered 2014-11-18 – 2014-12-02 (×33): 0.5 mg via ORAL
  Filled 2014-11-18 (×34): qty 1

## 2014-11-18 MED ORDER — METHOCARBAMOL 500 MG PO TABS
ORAL_TABLET | ORAL | Status: AC
  Filled 2014-11-18: qty 1

## 2014-11-18 MED ORDER — LACTATED RINGERS IV SOLN
INTRAVENOUS | Status: DC | PRN
  Administered 2014-11-18 (×3): via INTRAVENOUS

## 2014-11-18 MED ORDER — POLYETHYLENE GLYCOL 3350 17 G PO PACK
17.0000 g | PACK | Freq: Every day | ORAL | Status: DC | PRN
  Administered 2014-11-21 – 2014-12-02 (×7): 17 g via ORAL
  Filled 2014-11-18 (×8): qty 1

## 2014-11-18 MED ORDER — LIDOCAINE HCL (CARDIAC) 20 MG/ML IV SOLN
INTRAVENOUS | Status: DC | PRN
  Administered 2014-11-18: 50 mg via INTRAVENOUS

## 2014-11-18 MED ORDER — PROPOFOL 10 MG/ML IV BOLUS
INTRAVENOUS | Status: AC
  Filled 2014-11-18: qty 20

## 2014-11-18 MED ORDER — VANCOMYCIN HCL IN DEXTROSE 1-5 GM/200ML-% IV SOLN
1000.0000 mg | Freq: Once | INTRAVENOUS | Status: AC
  Administered 2014-11-18: 1000 mg via INTRAVENOUS

## 2014-11-18 MED ORDER — PHENYLEPHRINE HCL 10 MG/ML IJ SOLN
INTRAMUSCULAR | Status: DC | PRN
  Administered 2014-11-18 (×5): 80 ug via INTRAVENOUS

## 2014-11-18 MED ORDER — SUCCINYLCHOLINE CHLORIDE 20 MG/ML IJ SOLN
INTRAMUSCULAR | Status: DC | PRN
  Administered 2014-11-18: 120 mg via INTRAVENOUS

## 2014-11-18 MED ORDER — METHOCARBAMOL 500 MG PO TABS
500.0000 mg | ORAL_TABLET | Freq: Four times a day (QID) | ORAL | Status: DC | PRN
  Administered 2014-11-18 – 2014-12-02 (×41): 500 mg via ORAL
  Filled 2014-11-18 (×38): qty 1

## 2014-11-18 MED ORDER — LISINOPRIL 40 MG PO TABS
40.0000 mg | ORAL_TABLET | Freq: Every day | ORAL | Status: DC
  Administered 2014-11-18 – 2014-12-02 (×12): 40 mg via ORAL
  Filled 2014-11-18 (×15): qty 1

## 2014-11-18 MED ORDER — PROMETHAZINE HCL 25 MG/ML IJ SOLN: 6.2500 mg | INTRAMUSCULAR | Status: DC | PRN

## 2014-11-18 MED ORDER — OXYCODONE HCL 5 MG PO TABS
5.0000 mg | ORAL_TABLET | ORAL | Status: DC | PRN
  Administered 2014-11-18 – 2014-12-02 (×68): 10 mg via ORAL
  Filled 2014-11-18 (×69): qty 2

## 2014-11-18 MED ORDER — SENNA 8.6 MG PO TABS
1.0000 | ORAL_TABLET | Freq: Two times a day (BID) | ORAL | Status: DC
  Administered 2014-11-18 – 2014-12-02 (×26): 8.6 mg via ORAL
  Filled 2014-11-18 (×30): qty 1

## 2014-11-18 MED ORDER — FUROSEMIDE 40 MG PO TABS
40.0000 mg | ORAL_TABLET | Freq: Every day | ORAL | Status: DC
  Administered 2014-11-18 – 2014-12-02 (×14): 40 mg via ORAL
  Filled 2014-11-18 (×16): qty 1

## 2014-11-18 MED ORDER — CEFAZOLIN SODIUM-DEXTROSE 2-3 GM-% IV SOLR
2.0000 g | Freq: Three times a day (TID) | INTRAVENOUS | Status: AC
  Administered 2014-11-18 – 2014-11-20 (×9): 2 g via INTRAVENOUS
  Filled 2014-11-18 (×10): qty 50

## 2014-11-18 SURGICAL SUPPLY — 84 items
BANDAGE ELASTIC 4 VELCRO ST LF (GAUZE/BANDAGES/DRESSINGS) ×4 IMPLANT
BANDAGE ELASTIC 6 VELCRO ST LF (GAUZE/BANDAGES/DRESSINGS) IMPLANT
BANDAGE ESMARK 6X9 LF (GAUZE/BANDAGES/DRESSINGS) IMPLANT
BLADE SURG 10 STRL SS (BLADE) IMPLANT
BNDG COHESIVE 4X5 TAN STRL (GAUZE/BANDAGES/DRESSINGS) IMPLANT
BNDG COHESIVE 6X5 TAN STRL LF (GAUZE/BANDAGES/DRESSINGS) ×2 IMPLANT
BNDG ESMARK 6X9 LF (GAUZE/BANDAGES/DRESSINGS)
BNDG GAUZE ELAST 4 BULKY (GAUZE/BANDAGES/DRESSINGS) ×4 IMPLANT
BOOTCOVER CLEANROOM LRG (PROTECTIVE WEAR) ×2 IMPLANT
CANISTER WOUND CARE 500ML ATS (WOUND CARE) ×2 IMPLANT
CLAMP LG MULTI PIN (Clamp) ×4 IMPLANT
CLAMP ROD ATTACHMENT (Clamp) ×4 IMPLANT
CLAMP TUBE TO TUBE (Trauma) ×2 IMPLANT
CLEANER TIP ELECTROSURG 2X2 (MISCELLANEOUS) IMPLANT
CONNECTOR Y ATS VAC SYSTEM (MISCELLANEOUS) ×2 IMPLANT
COVER SURGICAL LIGHT HANDLE (MISCELLANEOUS) ×2 IMPLANT
CUFF TOURNIQUET SINGLE 18IN (TOURNIQUET CUFF) IMPLANT
CUFF TOURNIQUET SINGLE 24IN (TOURNIQUET CUFF) IMPLANT
CUFF TOURNIQUET SINGLE 34IN LL (TOURNIQUET CUFF) ×2 IMPLANT
DRAPE C-ARM 42X72 X-RAY (DRAPES) ×2 IMPLANT
DRAPE C-ARMOR (DRAPES) ×2 IMPLANT
DRAPE INCISE IOBAN 66X45 STRL (DRAPES) ×2 IMPLANT
DRAPE U-SHAPE 47X51 STRL (DRAPES) ×2 IMPLANT
DRSG ADAPTIC 3X8 NADH LF (GAUZE/BANDAGES/DRESSINGS) IMPLANT
DRSG MEPITEL 4X7.2 (GAUZE/BANDAGES/DRESSINGS) ×2 IMPLANT
DRSG VAC ATS LRG SENSATRAC (GAUZE/BANDAGES/DRESSINGS) ×2 IMPLANT
DRSG VAC ATS MED SENSATRAC (GAUZE/BANDAGES/DRESSINGS) IMPLANT
DURAPREP 26ML APPLICATOR (WOUND CARE) IMPLANT
ELECT REM PT RETURN 9FT ADLT (ELECTROSURGICAL) ×2
ELECTRODE REM PT RTRN 9FT ADLT (ELECTROSURGICAL) ×1 IMPLANT
EVACUATOR 1/8 PVC DRAIN (DRAIN) IMPLANT
GAUZE SPONGE 4X4 12PLY STRL (GAUZE/BANDAGES/DRESSINGS) ×2 IMPLANT
GAUZE XEROFORM 1X8 LF (GAUZE/BANDAGES/DRESSINGS) IMPLANT
GAUZE XEROFORM 5X9 LF (GAUZE/BANDAGES/DRESSINGS) ×2 IMPLANT
GLOVE BIOGEL PI IND STRL 7.5 (GLOVE) ×2 IMPLANT
GLOVE BIOGEL PI IND STRL 8 (GLOVE) ×2 IMPLANT
GLOVE BIOGEL PI INDICATOR 7.5 (GLOVE) ×2
GLOVE BIOGEL PI INDICATOR 8 (GLOVE) ×2
GLOVE BIOGEL PI ORTHO PRO SZ8 (GLOVE) ×1
GLOVE ORTHO TXT STRL SZ7.5 (GLOVE) ×4 IMPLANT
GLOVE PI ORTHO PRO STRL SZ8 (GLOVE) ×1 IMPLANT
GLOVE SURG ORTHO 8.0 STRL STRW (GLOVE) ×2 IMPLANT
GOWN STRL REUS W/ TWL LRG LVL3 (GOWN DISPOSABLE) ×1 IMPLANT
GOWN STRL REUS W/ TWL XL LVL3 (GOWN DISPOSABLE) IMPLANT
GOWN STRL REUS W/TWL 2XL LVL3 (GOWN DISPOSABLE) ×4 IMPLANT
GOWN STRL REUS W/TWL LRG LVL3 (GOWN DISPOSABLE) ×1
GOWN STRL REUS W/TWL XL LVL3 (GOWN DISPOSABLE)
HANDPIECE INTERPULSE COAX TIP (DISPOSABLE)
KIT BASIN OR (CUSTOM PROCEDURE TRAY) ×2 IMPLANT
KIT ROOM TURNOVER OR (KITS) ×2 IMPLANT
MANIFOLD NEPTUNE II (INSTRUMENTS) ×2 IMPLANT
NEEDLE 22X1 1/2 (OR ONLY) (NEEDLE) IMPLANT
NS IRRIG 1000ML POUR BTL (IV SOLUTION) ×2 IMPLANT
PACK ORTHO EXTREMITY (CUSTOM PROCEDURE TRAY) ×2 IMPLANT
PAD ARMBOARD 7.5X6 YLW CONV (MISCELLANEOUS) ×4 IMPLANT
PAD NEG PRESSURE SENSATRAC (MISCELLANEOUS) ×2 IMPLANT
PADDING CAST COTTON 6X4 STRL (CAST SUPPLIES) IMPLANT
ROD CARBON FIBER 600MM (Rod) ×2 IMPLANT
ROD CRBN FBR LRG EX-FX 11X250 (Rod) ×2 IMPLANT
ROD CRBN FBR LRG EX-FX 11X300 (Rod) ×2 IMPLANT
SCREW SCHNZ SD 5.0 60 THRD/150 (Screw) ×2 IMPLANT
SCREW SHANZ 5.0X175MM (Screw) ×4 IMPLANT
SCRW SCHANZ SD 5.0 60 THRD/150 (Screw) ×4 IMPLANT
SET HNDPC FAN SPRY TIP SCT (DISPOSABLE) IMPLANT
SPONGE GAUZE 4X4 12PLY STER LF (GAUZE/BANDAGES/DRESSINGS) ×2 IMPLANT
SPONGE LAP 18X18 X RAY DECT (DISPOSABLE) ×4 IMPLANT
SPONGE SCRUB IODOPHOR (GAUZE/BANDAGES/DRESSINGS) ×2 IMPLANT
STAPLER VISISTAT 35W (STAPLE) IMPLANT
STOCKINETTE IMPERVIOUS 9X36 MD (GAUZE/BANDAGES/DRESSINGS) IMPLANT
STOCKINETTE IMPERVIOUS LG (DRAPES) IMPLANT
SUCTION FRAZIER TIP 10 FR DISP (SUCTIONS) IMPLANT
SUT ETHILON 3 0 FSL (SUTURE) ×2 IMPLANT
SUT ETHILON 3 0 PS 1 (SUTURE) IMPLANT
SUT VIC AB 0 CT1 27 (SUTURE)
SUT VIC AB 0 CT1 27XBRD ANBCTR (SUTURE) IMPLANT
SUT VIC AB 2-0 CT1 27 (SUTURE)
SUT VIC AB 2-0 CT1 TAPERPNT 27 (SUTURE) IMPLANT
SYR CONTROL 10ML LL (SYRINGE) IMPLANT
TOWEL OR 17X24 6PK STRL BLUE (TOWEL DISPOSABLE) ×2 IMPLANT
TOWEL OR 17X26 10 PK STRL BLUE (TOWEL DISPOSABLE) ×2 IMPLANT
TUBE CONNECTING 12X1/4 (SUCTIONS) ×2 IMPLANT
UNDERPAD 30X30 INCONTINENT (UNDERPADS AND DIAPERS) ×4 IMPLANT
WATER STERILE IRR 1000ML POUR (IV SOLUTION) IMPLANT
YANKAUER SUCT BULB TIP NO VENT (SUCTIONS) ×2 IMPLANT
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
## 2014-11-18 NOTE — Progress Notes (Signed)
Chaplain responded to spiritual care consult regarding suicidal thoughts. Chaplain introduced self and asked if pt would like a visit; pt said yes, but at another time, as he is in pain. Pt also seemed tired. Pt did express "I just don't want to live to no more." Chaplain will inform unit chaplain for follow up.   Wille GlaserMcCray, Cienna Dumais O, Chaplain 11/18/2014 3:25 PM

## 2014-11-18 NOTE — Transfer of Care (Signed)
Immediate Anesthesia Transfer of Care Note  Patient: [name] [name]  Procedure(s) Performed: Procedure(s): FASCIOTOMY RIGHT LOWER LEG, EXTERNAL FIXATOR APPLIED TO RIGHT LEG, AND PLACEMENT OF WOUND VAC, IRRIGATION AND DEBRIDEMENT OF RIGHT KNEE JOINT, AND IRRIGATION AND DEBRIDEMENT OF OPEN FRACTURE RIGHT LOWER LEG (Right) EXTERNAL FIXATION LEG (Right)  Patient Location: PACU  Anesthesia Type:General  Level of Consciousness: awake and patient cooperative  Airway & Oxygen Therapy: Patient Spontanous Breathing and Patient connected to nasal cannula oxygen  Post-op Assessment: Report given to RN and Post -op Vital signs reviewed and stable  Post vital signs: Reviewed and stable  Last Vitals:  Filed Vitals:   11/18/14 0330  BP:   Pulse: 149  Temp: 36.7 C  Resp: 23    Complications: No apparent anesthesia complications

## 2014-11-18 NOTE — Progress Notes (Signed)
Patient ID: Retta Dionesnthony Kuroda, male   DOB: 01/14/1971, 44 y.o.   MRN: [id number]     Subjective:  Patient reports pain as mild to moderate.  Patient very upset and states that he just wants to die and that he has nothing to live for.  Denies any CP or SOB  Objective:   VITALS:   Filed Vitals:   11/18/14 0431 11/18/14 0500 11/18/14 0516 11/18/14 1257  BP: 151/79 138/82  151/82  Pulse: 115 119  117  Temp: 98.2 F (36.8 C) 99 F (37.2 C)  100.2 F (37.9 C)  TempSrc:  Oral    Resp: 13 24  22   SpO2: 100% 34% 100% 95%    ABD soft Sensation intact distally Dorsiflexion/Plantar flexion intact Incision: dressing C/D/I and moderate drainage Wound vac in place and active on right lower ext Ex Fix in place on right lower ext. Long arm splint in place on the left upper ext   Lab Results  Component Value Date   WBC 9.7 11/17/2014   HGB 15.6 11/17/2014   HCT 43.0 11/17/2014   MCV 87.9 11/17/2014   PLT 265 11/17/2014   BMET    Component Value Date/Time   NA 136 11/18/2014 0655   K 3.7 11/18/2014 0655   CL 103 11/18/2014 0655   CO2 24 11/18/2014 0655   GLUCOSE 233* 11/18/2014 0655   BUN 19 11/18/2014 0655   CREATININE 1.14 11/18/2014 0655   CALCIUM 8.2* 11/18/2014 0655   GFRNONAA 76* 11/18/2014 0655   GFRAA 88* 11/18/2014 0655     Assessment/Plan: Day of Surgery   Principal Problem:   Compartment syndrome, traumatic, lower extremity Active Problems:   Open right tibial fracture   Penetrating foreign body of right knee   Right tibial fracture   Advance diet  Continue Ex Fix Continue wound vac nwb right lower ext nwb left upper ext Continue splint on left upper ext   Haskel KhanDOUGLAS PARRY, BRANDON 11/18/2014, 3:01 PM  Discussed and agree with above  Teryl LucyJoshua Daquavion Catala, MD Cell [phone]

## 2014-11-18 NOTE — Anesthesia Preprocedure Evaluation (Addendum)
Anesthesia Evaluation  Patient identified by MRN, date of birth, ID band Patient awake    Reviewed: Allergy & Precautions, NPO status , Patient's Chart, lab work & pertinent test results  Airway Mallampati: II  TM Distance: >3 FB Neck ROM: Full    Dental  (+) Teeth Intact, Dental Advisory Given   Pulmonary Current Smoker,          Cardiovascular hypertension, Pt. on medications     Neuro/Psych negative neurological ROS  negative psych ROS   GI/Hepatic negative GI ROS, Neg liver ROS,   Endo/Other  diabetes, Well Controlled, Type 2, Oral Hypoglycemic Agents  Renal/GU negative Renal ROS     Musculoskeletal negative musculoskeletal ROS (+)   Abdominal   Peds  Hematology negative hematology ROS (+)   Anesthesia Other Findings   Reproductive/Obstetrics negative OB ROS                           Anesthesia Physical Anesthesia Plan  ASA: III and emergent  Anesthesia Plan: General   Post-op Pain Management:    Induction: Intravenous, Rapid sequence and Cricoid pressure planned  Airway Management Planned: Oral ETT  Additional Equipment: None  Intra-op Plan:   Post-operative Plan: Extubation in OR  Informed Consent:   Dental advisory given  Plan Discussed with: CRNA  Anesthesia Plan Comments:        Anesthesia Quick Evaluation

## 2014-11-18 NOTE — Anesthesia Procedure Notes (Signed)
Procedure Name: Intubation Date/Time: 11/18/2014 1:11 AM Performed by: Molli HazardGORDON, Saida Lonon M Pre-anesthesia Checklist: Patient identified, Emergency Drugs available, Suction available and Patient being monitored Patient Re-evaluated:Patient Re-evaluated prior to inductionOxygen Delivery Method: Circle system utilized Preoxygenation: Pre-oxygenation with 100% oxygen Intubation Type: IV induction, Rapid sequence and Cricoid Pressure applied Laryngoscope Size: Glidescope Tube type: Oral Tube size: 7.5 mm Number of attempts: 2 Airway Equipment and Method: Stylet and Video-laryngoscopy Placement Confirmation: ETT inserted through vocal cords under direct vision,  positive ETCO2 and breath sounds checked- equal and bilateral Secured at: 24 cm Tube secured with: Tape Dental Injury: Teeth and Oropharynx as per pre-operative assessment  Comments: DL x 1 with Miller 2. Unable to visualize cords. Mask ventilate. DL x 1 with Glidescope. Visualized cords and intubated without difficulty.
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
## 2014-11-18 NOTE — Anesthesia Postprocedure Evaluation (Signed)
  Anesthesia Post-op Note  Patient: [name] [name]  Procedure(s) Performed: Procedure(s): FASCIOTOMY RIGHT LOWER LEG, EXTERNAL FIXATOR APPLIED TO RIGHT LEG, AND PLACEMENT OF WOUND VAC, IRRIGATION AND DEBRIDEMENT OF RIGHT KNEE JOINT, AND IRRIGATION AND DEBRIDEMENT OF OPEN FRACTURE RIGHT LOWER LEG (Right) EXTERNAL FIXATION LEG (Right)  Patient Location: PACU  Anesthesia Type:General  Level of Consciousness: awake and alert   Airway and Oxygen Therapy: Patient Spontanous Breathing  Post-op Pain: none  Post-op Assessment: Post-op Vital signs reviewed  Post-op Vital Signs: Reviewed  Last Vitals:  Filed Vitals:   11/18/14 1257  BP: 151/82  Pulse: 117  Temp: 37.9 C  Resp: 22    Complications: No apparent anesthesia complications

## 2014-11-18 NOTE — Progress Notes (Signed)
Utilization review completed. Franchesca Veneziano, RN, BSN. 
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
## 2014-11-19 ENCOUNTER — Encounter (HOSPITAL_COMMUNITY): Payer: Self-pay | Admitting: Orthopedic Surgery

## 2014-11-19 DIAGNOSIS — T1491XA Suicide attempt, initial encounter: Secondary | ICD-10-CM | POA: Diagnosis present

## 2014-11-19 DIAGNOSIS — R45851 Suicidal ideations: Secondary | ICD-10-CM

## 2014-11-19 DIAGNOSIS — F329 Major depressive disorder, single episode, unspecified: Secondary | ICD-10-CM

## 2014-11-19 DIAGNOSIS — X749XXA Intentional self-harm by unspecified firearm discharge, initial encounter: Secondary | ICD-10-CM

## 2014-11-19 LAB — GLUCOSE, CAPILLARY
GLUCOSE-CAPILLARY: 195 mg/dL — AB (ref 70–99)
GLUCOSE-CAPILLARY: 264 mg/dL — AB (ref 70–99)
GLUCOSE-CAPILLARY: 206 mg/dL — AB (ref 70–99)
Glucose-Capillary: 254 mg/dL — ABNORMAL HIGH (ref 70–99)
Glucose-Capillary: 319 mg/dL — ABNORMAL HIGH (ref 70–99)

## 2014-11-19 LAB — BASIC METABOLIC PANEL
Anion gap: 11 (ref 5–15)
BUN: 9 mg/dL (ref 6–23)
CO2: 22 mmol/L (ref 19–32)
Calcium: 8.1 mg/dL — ABNORMAL LOW (ref 8.4–10.5)
Chloride: 100 mmol/L (ref 96–112)
Creatinine, Ser: 0.87 mg/dL (ref 0.50–1.35)
GFR calc Af Amer: >90 mL/min (ref >90)
GFR calc non Af Amer: >90 mL/min (ref >90)
Glucose, Bld: 219 mg/dL — ABNORMAL HIGH (ref 70–99)
Potassium: 3.4 mmol/L — ABNORMAL LOW (ref 3.5–5.1)
SODIUM: 133 mmol/L — AB (ref 135–145)

## 2014-11-19 MED ORDER — ENOXAPARIN SODIUM 40 MG/0.4ML ~~LOC~~ SOLN
40.0000 mg | SUBCUTANEOUS | Status: DC
  Administered 2014-11-19 – 2014-11-20 (×2): 40 mg via SUBCUTANEOUS
  Filled 2014-11-19 (×4): qty 0.4
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
## 2014-11-19 NOTE — Progress Notes (Addendum)
Pt. resting quietly in bed. States to this nurse "I tried to kill myself. I didn't want to be with my wife anymore. I just couldn't take it." This nurse asked, "Are you still having those feeling of wanting to harm yourself". Pt. Stated "Yes. When I leave here, I think I'm going to go to mental health and then to an assisted living. I feel like I can't take care of myself."  NT sitter at bedside.

## 2014-11-19 NOTE — Progress Notes (Addendum)
     Subjective:  Patient reports pain as marked.  The pain regimen and we have him on right now is adequate, but borderline.  He indicates that he is still suicidal, and does not feel that he can go back home.  Objective:   VITALS:   Filed Vitals:   11/18/14 0516 11/18/14 1257 11/18/14 1946 11/19/14 0410  BP:  151/82 119/59 138/75  Pulse:  117 114 121  Temp:  100.2 F (37.9 C) 99.7 F (37.6 C) 99.8 F (37.7 C)  TempSrc:   Oral Oral  Resp:  22 17 18   SpO2: 100% 95% 95% 94%    Left hand has substantial soft tissue swelling, but finger flexion and extension is intact. He says sensation is intact although feels a little bit numb.  Right leg has intact external fixator and wound VAC intact. Sensation is grossly intact at the toes, and EHL and FHL are functioning weakly.  Lab Results  Component Value Date   WBC 9.7 11/17/2014   HGB 15.6 11/17/2014   HCT 43.0 11/17/2014   MCV 87.9 11/17/2014   PLT 265 11/17/2014   BMET    Component Value Date/Time   NA 133* 11/19/2014 0540   K 3.4* 11/19/2014 0540   CL 100 11/19/2014 0540   CO2 22 11/19/2014 0540   GLUCOSE 219* 11/19/2014 0540   BUN 9 11/19/2014 0540   CREATININE 0.87 11/19/2014 0540   CALCIUM 8.1* 11/19/2014 0540   GFRNONAA >90 11/19/2014 0540   GFRAA >90 11/19/2014 0540     Assessment/Plan: 1 Day Post-Op   Principal Problem:   Compartment syndrome, traumatic, lower extremity Active Problems:   Open right tibial fracture   Penetrating foreign body of right knee   Right tibial fracture  He also has depression and ongoing suicidal ideation.  I have ordered a psychiatry consult, as they will need to be involved on the acute basis and probably long-term as well.  I do not exactly know his disposition yet, he is going to need further surgical intervention with delayed primary closure possibly later this week of either one or more of the fasciotomy wounds. The medial side was really the most swollen,  particularly the posterior compartment.  He will also need definitive fixation for his tibia at some point as well. For now we are simply allowing the swelling to come down, and he will likely be in-house through until the end of the week, and we'll see what placement options are available from psychiatry standpoint.  From a DVT standpoint, he is somewhat challenged, because we cannot put a sequential compression device on the left leg because of this has been amputated, and the right leg has an external fixator with a tibia fracture, and the left upper extremity has a fracture of the wrist, and he needs his right upper extremity to function. We could consider chemoprophylaxis, however we have been holding off given the fact that he has open fasciotomy wounds, and severe swelling from his trauma, that have compromised tissue integrity.  Keiva Dina P 11/19/2014, 10:37 AM   Teryl LucyJoshua Jonaven Hilgers, MD Cell [phone]    ADDENDUM:  Discussed with our truamatologist, lovenox seems ok in fasciotomy wounds 48 hrs post op.  Will add to regimen.
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
## 2014-11-20 LAB — BASIC METABOLIC PANEL
Anion gap: 10 (ref 5–15)
BUN: 16 mg/dL (ref 6–23)
CO2: 26 mmol/L (ref 19–32)
Calcium: 7.9 mg/dL — ABNORMAL LOW (ref 8.4–10.5)
Chloride: 98 mmol/L (ref 96–112)
Creatinine, Ser: 0.98 mg/dL (ref 0.50–1.35)
Glucose, Bld: 277 mg/dL — ABNORMAL HIGH (ref 70–99)
Potassium: 3.4 mmol/L — ABNORMAL LOW (ref 3.5–5.1)
SODIUM: 134 mmol/L — AB (ref 135–145)

## 2014-11-20 LAB — GLUCOSE, CAPILLARY
GLUCOSE-CAPILLARY: 264 mg/dL — AB (ref 70–99)
GLUCOSE-CAPILLARY: 233 mg/dL — AB (ref 70–99)
Glucose-Capillary: 271 mg/dL — ABNORMAL HIGH (ref 70–99)
Glucose-Capillary: 257 mg/dL — ABNORMAL HIGH (ref 70–99)
Glucose-Capillary: 210 mg/dL — ABNORMAL HIGH (ref 70–99)
Glucose-Capillary: 232 mg/dL — ABNORMAL HIGH (ref 70–99)

## 2014-11-20 MED ORDER — OXYCODONE HCL 5 MG PO TABS
ORAL_TABLET | ORAL | Status: AC
  Filled 2014-11-20: qty 10

## 2014-11-20 MED ORDER — METHOCARBAMOL 500 MG PO TABS
ORAL_TABLET | ORAL | Status: AC
  Filled 2014-11-20: qty 1

## 2014-11-20 MED ORDER — METFORMIN HCL 500 MG PO TABS
1000.0000 mg | ORAL_TABLET | Freq: Every day | ORAL | Status: DC
  Administered 2014-11-20 – 2014-12-02 (×11): 1000 mg via ORAL
  Filled 2014-11-20 (×13): qty 2
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
## 2014-11-20 NOTE — Progress Notes (Signed)
Chaplain responded to referral from chaplain. Pt reports no spiritual needs at this time. Chaplain informed pt of chaplain services should he need them. Page chaplain as needed.    11/20/14 1000  Clinical Encounter Type  Visited With Patient  Visit Type Initial;Spiritual support  Referral From Chaplain  StameyMayer Masker, Damichael Hofman F, Chaplain 11/20/2014 10:08 AM
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
## 2014-11-20 NOTE — Progress Notes (Signed)
Inpatient Diabetes Program Recommendations  AACE/ADA: New Consensus Statement on Inpatient Glycemic Control (2013)  Target Ranges:  Prepandial:   less than 140 mg/dL      Peak postprandial:   less than 180 mg/dL (1-2 hours)      Critically ill patients:  140 - 180 mg/dL   Inpatient Diabetes Program Recommendations HgbA1C: order to assess prehospital glucose control Consider adding Lantus 10 units.   Thank you  Piedad ClimesGina Jasamine Pottinger BSN, RN,CDE Inpatient Diabetes Coordinator [phone] (team pager)
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
## 2014-11-20 NOTE — Progress Notes (Signed)
Inpatient Diabetes Program Recommendations  AACE/ADA: New Consensus Statement on Inpatient Glycemic Control (2013)  Target Ranges:  Prepandial:   less than 140 mg/dL      Peak postprandial:   less than 180 mg/dL (1-2 hours)      Critically ill patients:  140 - 180 mg/dL   Reason for Visit: Hyperglycemia  Diabetes history: DM2 Outpatient Diabetes medications: metformin 1000 mg bid Current orders for Inpatient glycemic control: metformin 1000 mg bid, Novolog moderate tidwc  Pt states he controlled blood sugars well PTA. Has meter and checks sugars 2-3 times/day. States HgbA1C usually runs 6.3 - 6.4%.  Weight is stable.  Results for [name] [name], [name] [name] (MRN [id number]) as of 11/20/2014 12:44  Ref. Range 11/19/2014 11:38 11/19/2014 17:02 11/19/2014 22:20 11/20/2014 06:44 11/20/2014 12:09  Glucose-Capillary Latest Range: 70-99 mg/dL 657206 (H) 846254 (H) 962319 (H) 271 (H) 257 (H)   Blood sugars uncontrolled in 200-300s. Needs basal insulin along with meal coverage for improved glycemic control.  Inpatient Diabetes Program Recommendations HgbA1C: order to assess prehospital glucose control  Consider Lantus 15 units QHS Consider Novolog 4 units tidwc for meal coverage insulin. Add HS correction.  Note: Will continue to follow. Thank you. Ailene Ardshonda Kaliopi Blyden, RD, LDN, CDE Inpatient Diabetes Coordinator [phone]

## 2014-11-21 ENCOUNTER — Encounter (HOSPITAL_COMMUNITY)
Admission: EM | Disposition: A | Discharge status: Rehabilitation Facility | Payer: Self-pay | Source: Home / Self Care | Attending: Orthopedic Surgery

## 2014-11-21 ENCOUNTER — Inpatient Hospital Stay (HOSPITAL_COMMUNITY): Payer: Federal, State, Local not specified - PPO | Admitting: Anesthesiology

## 2014-11-21 ENCOUNTER — Inpatient Hospital Stay (HOSPITAL_COMMUNITY): Payer: Federal, State, Local not specified - PPO

## 2014-11-21 HISTORY — PX: APPLICATION OF WOUND VAC: SHX5189

## 2014-11-21 HISTORY — PX: SECONDARY CLOSURE OF WOUND: SHX6208

## 2014-11-21 HISTORY — PX: ORIF WRIST FRACTURE: SHX2133

## 2014-11-21 HISTORY — PX: I & D EXTREMITY: SHX5045

## 2014-11-21 LAB — GLUCOSE, CAPILLARY
GLUCOSE-CAPILLARY: 178 mg/dL — AB (ref 70–99)
GLUCOSE-CAPILLARY: 306 mg/dL — AB (ref 70–99)
Glucose-Capillary: 194 mg/dL — ABNORMAL HIGH (ref 70–99)
Glucose-Capillary: 212 mg/dL — ABNORMAL HIGH (ref 70–99)
Glucose-Capillary: 165 mg/dL — ABNORMAL HIGH (ref 70–99)

## 2014-11-21 SURGERY — IRRIGATION AND DEBRIDEMENT EXTREMITY
Anesthesia: General | Site: Leg Lower | Laterality: Right

## 2014-11-21 MED ORDER — ONDANSETRON HCL 4 MG/2ML IJ SOLN: 4.0000 mg | Freq: Once | INTRAMUSCULAR | Status: DC | PRN

## 2014-11-21 MED ORDER — DEXTROSE-NACL 5-0.45 % IV SOLN: INTRAVENOUS | Status: AC

## 2014-11-21 MED ORDER — ROCURONIUM BROMIDE 100 MG/10ML IV SOLN
INTRAVENOUS | Status: DC | PRN
  Administered 2014-11-21: 50 mg via INTRAVENOUS

## 2014-11-21 MED ORDER — FENTANYL CITRATE 0.05 MG/ML IJ SOLN
INTRAMUSCULAR | Status: AC
  Filled 2014-11-21: qty 5

## 2014-11-21 MED ORDER — ONDANSETRON HCL 4 MG/2ML IJ SOLN
INTRAMUSCULAR | Status: DC | PRN
  Administered 2014-11-21: 4 mg via INTRAVENOUS

## 2014-11-21 MED ORDER — ENOXAPARIN SODIUM 40 MG/0.4ML ~~LOC~~ SOLN
40.0000 mg | SUBCUTANEOUS | Status: DC
  Administered 2014-11-22 – 2014-12-02 (×9): 40 mg via SUBCUTANEOUS
  Filled 2014-11-21 (×9): qty 0.4

## 2014-11-21 MED ORDER — MIDAZOLAM HCL 2 MG/2ML IJ SOLN
INTRAMUSCULAR | Status: AC
  Filled 2014-11-21: qty 2

## 2014-11-21 MED ORDER — MEPERIDINE HCL 25 MG/ML IJ SOLN: 6.2500 mg | INTRAMUSCULAR | Status: DC | PRN

## 2014-11-21 MED ORDER — SODIUM CHLORIDE 0.9 % IR SOLN
Status: DC | PRN
  Administered 2014-11-21: 3000 mL
  Administered 2014-11-21: 1000 mL

## 2014-11-21 MED ORDER — CEFAZOLIN SODIUM-DEXTROSE 2-3 GM-% IV SOLR
INTRAVENOUS | Status: DC | PRN
  Administered 2014-11-21: 2 g via INTRAVENOUS

## 2014-11-21 MED ORDER — HYDROMORPHONE HCL 1 MG/ML IJ SOLN
INTRAMUSCULAR | Status: AC
  Administered 2014-11-21: 0.5 mg via INTRAVENOUS
  Filled 2014-11-21: qty 1

## 2014-11-21 MED ORDER — BUPIVACAINE HCL (PF) 0.25 % IJ SOLN
INTRAMUSCULAR | Status: AC
  Filled 2014-11-21: qty 30

## 2014-11-21 MED ORDER — FENTANYL CITRATE 0.05 MG/ML IJ SOLN
INTRAMUSCULAR | Status: AC
  Filled 2014-11-21: qty 2

## 2014-11-21 MED ORDER — NEOSTIGMINE METHYLSULFATE 10 MG/10ML IV SOLN
INTRAVENOUS | Status: DC | PRN
  Administered 2014-11-21: 3 mg via INTRAVENOUS

## 2014-11-21 MED ORDER — HYDROMORPHONE HCL 1 MG/ML IJ SOLN
0.2500 mg | INTRAMUSCULAR | Status: DC | PRN
  Administered 2014-11-21: 0.5 mg via INTRAVENOUS

## 2014-11-21 MED ORDER — LIDOCAINE HCL (CARDIAC) 20 MG/ML IV SOLN
INTRAVENOUS | Status: AC
  Filled 2014-11-21: qty 5

## 2014-11-21 MED ORDER — PROPOFOL 10 MG/ML IV BOLUS
INTRAVENOUS | Status: DC | PRN
Start: 2014-11-21 — End: 2014-11-21
  Administered 2014-11-21: 120 mg via INTRAVENOUS

## 2014-11-21 MED ORDER — ROCURONIUM BROMIDE 50 MG/5ML IV SOLN
INTRAVENOUS | Status: AC
  Filled 2014-11-21: qty 1

## 2014-11-21 MED ORDER — CEFAZOLIN SODIUM-DEXTROSE 2-3 GM-% IV SOLR
2.0000 g | Freq: Four times a day (QID) | INTRAVENOUS | Status: AC
  Administered 2014-11-21 – 2014-11-22 (×3): 2 g via INTRAVENOUS
  Filled 2014-11-21 (×3): qty 50

## 2014-11-21 MED ORDER — LIDOCAINE HCL (CARDIAC) 20 MG/ML IV SOLN
INTRAVENOUS | Status: DC | PRN
Start: 2014-11-21 — End: 2014-11-21
  Administered 2014-11-21: 100 mg via INTRAVENOUS

## 2014-11-21 MED ORDER — LACTATED RINGERS IV SOLN
INTRAVENOUS | Status: DC
  Administered 2014-11-21 – 2014-11-26 (×3): via INTRAVENOUS

## 2014-11-21 MED ORDER — MIDAZOLAM HCL 5 MG/5ML IJ SOLN
INTRAMUSCULAR | Status: DC | PRN
  Administered 2014-11-21: 2 mg via INTRAVENOUS

## 2014-11-21 MED ORDER — PROPOFOL 10 MG/ML IV BOLUS
INTRAVENOUS | Status: AC
  Filled 2014-11-21: qty 20

## 2014-11-21 MED ORDER — GLYCOPYRROLATE 0.2 MG/ML IJ SOLN
INTRAMUSCULAR | Status: DC | PRN
  Administered 2014-11-21: 0.4 mg via INTRAVENOUS

## 2014-11-21 MED ORDER — FENTANYL CITRATE 0.05 MG/ML IJ SOLN
INTRAMUSCULAR | Status: DC | PRN
  Administered 2014-11-21: 100 ug via INTRAVENOUS
  Administered 2014-11-21: 50 ug via INTRAVENOUS
  Administered 2014-11-21: 100 ug via INTRAVENOUS
  Administered 2014-11-21 (×5): 50 ug via INTRAVENOUS

## 2014-11-21 MED ORDER — BUPIVACAINE HCL (PF) 0.25 % IJ SOLN
INTRAMUSCULAR | Status: DC | PRN
  Administered 2014-11-21: 10 mL

## 2014-11-21 SURGICAL SUPPLY — 72 items
BANDAGE ELASTIC 3 VELCRO ST LF (GAUZE/BANDAGES/DRESSINGS) ×3 IMPLANT
BANDAGE ELASTIC 4 VELCRO ST LF (GAUZE/BANDAGES/DRESSINGS) ×6 IMPLANT
BANDAGE ELASTIC 6 VELCRO ST LF (GAUZE/BANDAGES/DRESSINGS) ×3 IMPLANT
BENZOIN TINCTURE PRP APPL 2/3 (GAUZE/BANDAGES/DRESSINGS) ×3 IMPLANT
BLADE SURG 10 STRL SS (BLADE) ×3 IMPLANT
BLADE SURG ROTATE 9660 (MISCELLANEOUS) IMPLANT
BNDG COHESIVE 4X5 TAN STRL (GAUZE/BANDAGES/DRESSINGS) ×3 IMPLANT
BNDG ESMARK 4X9 LF (GAUZE/BANDAGES/DRESSINGS) ×3 IMPLANT
BNDG GAUZE ELAST 4 BULKY (GAUZE/BANDAGES/DRESSINGS) ×9 IMPLANT
BOOTCOVER CLEANROOM LRG (PROTECTIVE WEAR) ×6 IMPLANT
CORDS BIPOLAR (ELECTRODE) ×3 IMPLANT
COVER SURGICAL LIGHT HANDLE (MISCELLANEOUS) ×6 IMPLANT
CUFF TOURNIQUET SINGLE 18IN (TOURNIQUET CUFF) ×3 IMPLANT
CUFF TOURNIQUET SINGLE 24IN (TOURNIQUET CUFF) IMPLANT
CUFF TOURNIQUET SINGLE 34IN LL (TOURNIQUET CUFF) IMPLANT
DECANTER SPIKE VIAL GLASS SM (MISCELLANEOUS) IMPLANT
DRAPE INCISE IOBAN 66X45 STRL (DRAPES) ×3 IMPLANT
DRAPE OEC MINIVIEW 54X84 (DRAPES) IMPLANT
DRAPE U-SHAPE 47X51 STRL (DRAPES) ×3 IMPLANT
DRSG VAC ATS LRG SENSATRAC (GAUZE/BANDAGES/DRESSINGS) ×3 IMPLANT
DURAPREP 26ML APPLICATOR (WOUND CARE) ×3 IMPLANT
ELECT REM PT RETURN 9FT ADLT (ELECTROSURGICAL)
ELECTRODE REM PT RTRN 9FT ADLT (ELECTROSURGICAL) IMPLANT
EVACUATOR 1/8 PVC DRAIN (DRAIN) IMPLANT
FACESHIELD WRAPAROUND (MASK) ×9 IMPLANT
GAUZE SPONGE 4X4 12PLY STRL (GAUZE/BANDAGES/DRESSINGS) ×6 IMPLANT
GAUZE XEROFORM 1X8 LF (GAUZE/BANDAGES/DRESSINGS) ×3 IMPLANT
GAUZE XEROFORM 5X9 LF (GAUZE/BANDAGES/DRESSINGS) ×3 IMPLANT
GLOVE BIO SURGEON STRL SZ7.5 (GLOVE) ×15 IMPLANT
GLOVE BIO SURGEON STRL SZ8 (GLOVE) ×3 IMPLANT
GLOVE BIOGEL PI IND STRL 8 (GLOVE) ×6 IMPLANT
GLOVE BIOGEL PI INDICATOR 8 (GLOVE) ×3
GLOVE ORTHO TXT STRL SZ7.5 (GLOVE) ×6 IMPLANT
GOWN STRL REUS W/ TWL LRG LVL3 (GOWN DISPOSABLE) ×6 IMPLANT
GOWN STRL REUS W/ TWL XL LVL3 (GOWN DISPOSABLE) IMPLANT
GOWN STRL REUS W/TWL 2XL LVL3 (GOWN DISPOSABLE) ×6 IMPLANT
GOWN STRL REUS W/TWL LRG LVL3 (GOWN DISPOSABLE) ×3
GOWN STRL REUS W/TWL XL LVL3 (GOWN DISPOSABLE)
HANDPIECE INTERPULSE COAX TIP (DISPOSABLE)
KIT BASIN OR (CUSTOM PROCEDURE TRAY) ×3 IMPLANT
KIT ROOM TURNOVER OR (KITS) ×3 IMPLANT
MANIFOLD NEPTUNE II (INSTRUMENTS) ×3 IMPLANT
NEEDLE HYPO 25GX1X1/2 BEV (NEEDLE) ×3 IMPLANT
NS IRRIG 1000ML POUR BTL (IV SOLUTION) ×6 IMPLANT
PACK ORTHO EXTREMITY (CUSTOM PROCEDURE TRAY) ×3 IMPLANT
PAD ARMBOARD 7.5X6 YLW CONV (MISCELLANEOUS) ×6 IMPLANT
PAD CAST 4YDX4 CTTN HI CHSV (CAST SUPPLIES) ×2 IMPLANT
PAD NEG PRESSURE SENSATRAC (MISCELLANEOUS) ×3 IMPLANT
PADDING CAST ABS 4INX4YD NS (CAST SUPPLIES) ×1
PADDING CAST ABS COTTON 4X4 ST (CAST SUPPLIES) ×2 IMPLANT
PADDING CAST COTTON 4X4 STRL (CAST SUPPLIES) ×1
PENCIL BUTTON HOLSTER BLD 10FT (ELECTRODE) IMPLANT
SET HNDPC FAN SPRY TIP SCT (DISPOSABLE) IMPLANT
SPLINT PLASTER CAST XFAST 5X30 (CAST SUPPLIES) ×2 IMPLANT
SPLINT PLASTER XFAST SET 5X30 (CAST SUPPLIES) ×1
SPONGE LAP 18X18 X RAY DECT (DISPOSABLE) ×3 IMPLANT
SPONGE LAP 4X18 X RAY DECT (DISPOSABLE) ×3 IMPLANT
STOCKINETTE IMPERVIOUS 9X36 MD (GAUZE/BANDAGES/DRESSINGS) ×3 IMPLANT
STRIP CLOSURE SKIN 1/2X4 (GAUZE/BANDAGES/DRESSINGS) ×3 IMPLANT
SUCTION FRAZIER TIP 10 FR DISP (SUCTIONS) ×3 IMPLANT
SUT ETHILON 3 0 PS 1 (SUTURE) ×12 IMPLANT
SUT MNCRL AB 4-0 PS2 18 (SUTURE) ×6 IMPLANT
SUT MON AB 2-0 CT1 36 (SUTURE) ×6 IMPLANT
SYR CONTROL 10ML LL (SYRINGE) ×3 IMPLANT
TOWEL OR 17X24 6PK STRL BLUE (TOWEL DISPOSABLE) ×3 IMPLANT
TOWEL OR 17X26 10 PK STRL BLUE (TOWEL DISPOSABLE) ×3 IMPLANT
TOWEL OR NON WOVEN STRL DISP B (DISPOSABLE) ×3 IMPLANT
TUBE ANAEROBIC SPECIMEN COL (MISCELLANEOUS) IMPLANT
TUBE CONNECTING 12X1/4 (SUCTIONS) ×3 IMPLANT
UNDERPAD 30X30 INCONTINENT (UNDERPADS AND DIAPERS) ×3 IMPLANT
WATER STERILE IRR 1000ML POUR (IV SOLUTION) ×6 IMPLANT
YANKAUER SUCT BULB TIP NO VENT (SUCTIONS) ×3 IMPLANT
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
## 2014-11-21 NOTE — Anesthesia Procedure Notes (Signed)
Procedure Name: Intubation Date/Time: 11/21/2014 3:47 PM Performed by: Adonis HousekeeperNGELL, Eliott Amparan M Pre-anesthesia Checklist: Patient identified, Emergency Drugs available, Suction available and Patient being monitored Patient Re-evaluated:Patient Re-evaluated prior to inductionOxygen Delivery Method: Circle system utilized Preoxygenation: Pre-oxygenation with 100% oxygen Intubation Type: IV induction Ventilation: Oral airway inserted - appropriate to patient size Laryngoscope size: dl x 1 grade 4 view with mac 4 by CRNA.  dl video glidescope with grade 1 view and tube placed easily  Tube type: Oral Tube size: 7.5 mm Number of attempts: 2 Airway Equipment and Method: Stylet and Video-laryngoscopy Placement Confirmation: ETT inserted through vocal cords under direct vision,  positive ETCO2 and breath sounds checked- equal and bilateral Secured at: 23 cm Tube secured with: Tape Dental Injury: Teeth and Oropharynx as per pre-operative assessment  Difficulty Due To: Difficult Airway- due to immobile epiglottis
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
## 2014-11-21 NOTE — Transfer of Care (Signed)
Immediate Anesthesia Transfer of Care Note  Patient: [name] [name]  Procedure(s) Performed: Procedure(s): IRRIGATION AND DEBRIDEMENT EXTREMITY (Right) SECONDARY CLOSURE OF WOUND (Right) OPEN REDUCTION INTERNAL FIXATION (ORIF) LEFT DISTAL RADIAL FRACTURE (Left) APPLICATION OF WOUND VAC (Right)  Patient Location: PACU  Anesthesia Type:General  Level of Consciousness: awake, alert , oriented and patient cooperative  Airway & Oxygen Therapy: Patient Spontanous Breathing and Patient connected to nasal cannula oxygen  Post-op Assessment: Report given to RN and Post -op Vital signs reviewed and stable  Post vital signs: Reviewed and stable  Last Vitals:  Filed Vitals:   11/21/14 1314  BP: 120/69  Pulse: 94  Temp: 36.8 C  Resp: 18    Complications: No apparent anesthesia complications

## 2014-11-21 NOTE — Progress Notes (Signed)
PT Cancellation Note  Patient Details Name: [name] [name] MRN: [id number] DOB: 03/28/1971   Cancelled Treatment:    Reason Eval/Treat Not Completed: Other (comment).  Pt reports he is too painful and too preoccupied with having surgery today.  He also states his mouth is dry, his toes are tingling, and he hasn't eaten.  PT to check back tomorrow post-op to try to encourage increased mobility. If MD can continue to emphasize the importance of working with PT and OOB mobility as well this would help.  He can do it physically, but is very self limiting.  PT does their best to ensure that he is maximally pre medicated.    Thanks,    Rollene Rotundaebecca B. Mesha Schamberger, PT, DPT [phone]   11/21/2014, 11:11 AM
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
## 2014-11-21 NOTE — Progress Notes (Signed)
On my pre-op exam today he had global sensation decreased but intact sensation to his R foot. 2+ pulse.    Kayliegh Boyers, D
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
## 2014-11-21 NOTE — Anesthesia Postprocedure Evaluation (Signed)
  Anesthesia Post-op Note  Patient: [name] [name]  Procedure(s) Performed: Procedure(s): IRRIGATION AND DEBRIDEMENT EXTREMITY (Right) SECONDARY CLOSURE OF WOUND (Right) OPEN REDUCTION INTERNAL FIXATION (ORIF) LEFT DISTAL RADIAL FRACTURE (Left) APPLICATION OF WOUND VAC (Right)  Patient Location: PACU  Anesthesia Type:General  Level of Consciousness: awake and alert   Airway and Oxygen Therapy: Patient Spontanous Breathing  Post-op Pain: mild  Post-op Assessment: Post-op Vital signs reviewed, Patient's Cardiovascular Status Stable and Respiratory Function Stable  Post-op Vital Signs: Reviewed  Filed Vitals:   11/21/14 1745  BP: 127/58  Pulse: 107  Temp:   Resp: 16    Complications: No apparent anesthesia complications

## 2014-11-21 NOTE — Anesthesia Preprocedure Evaluation (Signed)
Anesthesia Evaluation  Patient identified by MRN, date of birth, ID band Patient awake    Reviewed: Allergy & Precautions, NPO status , Patient's Chart, lab work & pertinent test results  Airway Mallampati: I  TM Distance: >3 FB Neck ROM: Full    Dental   Pulmonary Current Smoker,          Cardiovascular hypertension, Pt. on medications     Neuro/Psych    GI/Hepatic   Endo/Other  diabetes, Type 2, Oral Hypoglycemic Agents  Renal/GU      Musculoskeletal   Abdominal   Peds  Hematology   Anesthesia Other Findings   Reproductive/Obstetrics                             Anesthesia Physical Anesthesia Plan  ASA: II  Anesthesia Plan: General   Post-op Pain Management:    Induction: Intravenous  Airway Management Planned: Oral ETT  Additional Equipment:   Intra-op Plan:   Post-operative Plan: Extubation in OR  Informed Consent: I have reviewed the patients History and Physical, chart, labs and discussed the procedure including the risks, benefits and alternatives for the proposed anesthesia with the patient or authorized representative who has indicated his/her understanding and acceptance.     Plan Discussed with: CRNA and Surgeon  Anesthesia Plan Comments:         Anesthesia Quick Evaluation

## 2014-11-22 ENCOUNTER — Encounter (HOSPITAL_COMMUNITY): Payer: Self-pay | Admitting: Orthopedic Surgery

## 2014-11-22 LAB — GLUCOSE, CAPILLARY
Glucose-Capillary: 209 mg/dL — ABNORMAL HIGH (ref 70–99)
Glucose-Capillary: 243 mg/dL — ABNORMAL HIGH (ref 70–99)
Glucose-Capillary: 201 mg/dL — ABNORMAL HIGH (ref 70–99)
Glucose-Capillary: 237 mg/dL — ABNORMAL HIGH (ref 70–99)

## 2014-11-22 NOTE — Progress Notes (Addendum)
Results for [name] [name], [name] [name] (MRN [id number]) as of 11/22/2014 14:47  Ref. Range 11/21/2014 21:29 11/22/2014 06:44 11/22/2014 12:01  Glucose-Capillary Latest Range: 70-99 mg/dL 409306 (H) 811209 (H) 914243 (H)   For improved healing and better outcomes, please consider the following:  Add Lantus 15 units QHS Add Novolog 4 units tidwc for meal coverage insulin. Check HgbA1C to assess pre-hospital glycemic control.  Will continue to follow. Thank you. Ailene Ardshonda Keil Pickering, RD, LDN, CDE Inpatient Diabetes Coordinator [phone]
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
## 2014-11-22 NOTE — Progress Notes (Signed)
Patient ID: [name] [name], male   DOB: 11/16/1970, 44 y.o.   MRN: [id number]     Subjective:  Patient reports pain as mild.  Patient denies any CP or SOB.    Objective:   VITALS:   Filed Vitals:   11/21/14 2020 11/21/14 2022 11/22/14 0117 11/22/14 0442  BP: 154/58  145/79 128/70  Pulse: 115  107 99  Temp: 101.6 F (38.7 C) 98.8 F (37.1 C) 98.8 F (37.1 C) 98.9 F (37.2 C)  TempSrc: Oral Axillary Oral Oral  Resp: 18  18 16   SpO2: 97%  94% 97%    ABD soft Sensation intact distally Dorsiflexion/Plantar flexion intact Incision: dressing C/D/I and no drainage   Lab Results  Component Value Date   WBC 9.7 11/17/2014   HGB 15.6 11/17/2014   HCT 43.0 11/17/2014   MCV 87.9 11/17/2014   PLT 265 11/17/2014     Assessment/Plan: 1 Day Post-Op   Principal Problem:   Suicide attempt Active Problems:   Compartment syndrome, traumatic, lower extremity   Open right tibial fracture   Penetrating foreign body of right knee   Right tibial fracture   Advance diet  Continue non weight bearing on the right lower ext Continue wound vac on right lower ext Plan for IM nailing and possible wound closure on right lower ext. Tuesday next week. Continue short arm Splint left upper ext. Patient needs to continue Ice and elevation Sling left upper ext.    Haskel KhanDOUGLAS Owenn Rothermel 11/22/2014, 2:33 PM   Margarita Ranaimothy Murphy MD [phone]

## 2014-11-22 NOTE — Progress Notes (Signed)
Orthopedic Tech Progress Note Patient Details:  [name] [name] 03/20/1971 [id number]  Ortho Devices Type of Ortho Device: Arm sling Ortho Device/Splint Location: LUE Ortho Device/Splint Interventions: Ordered, Application   Jennye MoccasinHughes, Teal Craig 11/22/2014, 3:38 PM
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
## 2014-11-23 LAB — URINALYSIS, ROUTINE W REFLEX MICROSCOPIC
Bilirubin Urine: NEGATIVE
Glucose, UA: 250 mg/dL — AB
Hgb urine dipstick: NEGATIVE
KETONES UR: NEGATIVE mg/dL
LEUKOCYTES UA: NEGATIVE
Nitrite: NEGATIVE
Protein, ur: NEGATIVE mg/dL
SPECIFIC GRAVITY, URINE: 1.012 (ref 1.005–1.030)
UROBILINOGEN UA: 0.2 mg/dL (ref 0.0–1.0)
pH: 6.5 (ref 5.0–8.0)

## 2014-11-23 LAB — GLUCOSE, CAPILLARY
GLUCOSE-CAPILLARY: 226 mg/dL — AB (ref 70–99)
Glucose-Capillary: 222 mg/dL — ABNORMAL HIGH (ref 70–99)
Glucose-Capillary: 209 mg/dL — ABNORMAL HIGH (ref 70–99)
Glucose-Capillary: 191 mg/dL — ABNORMAL HIGH (ref 70–99)

## 2014-11-23 MED ORDER — ALUM & MAG HYDROXIDE-SIMETH 200-200-20 MG/5ML PO SUSP
30.0000 mL | ORAL | Status: DC | PRN
  Administered 2014-11-23 (×2): 30 mL via ORAL
  Filled 2014-11-23 (×2): qty 30

## 2014-11-23 MED ORDER — PANTOPRAZOLE SODIUM 40 MG PO TBEC
40.0000 mg | DELAYED_RELEASE_TABLET | Freq: Every day | ORAL | Status: DC
  Administered 2014-11-23 – 2014-12-02 (×8): 40 mg via ORAL
  Filled 2014-11-23 (×8): qty 1

## 2014-11-23 NOTE — Clinical Social Work Note (Signed)
Clinical Social Work Department BRIEF PSYCHOSOCIAL ASSESSMENT 11/30/2014  Patient:  [name] [name], [name] [name]     Account Number:  [id number]     Admit date:  11/17/2014  Clinical Social Worker:  Hubert Azure  Date/Time:  11/24/2014 09:12 AM  Referred by:  Physician  Date Referred:  11/24/2014  Other Referral:   Interview type:  Patient Other interview type:   CSW spoke with patient's wife [name] [name] by phone.    PSYCHOSOCIAL DATA Living Status:  WIFE Admitted from facility:   Level of care:   Primary support name:  [name] [name] [phone]) Primary support relationship to patient:  SPOUSE Degree of support available:   Good    CURRENT CONCERNS Current Concerns  Post-Acute Placement   Other Concerns:    SOCIAL WORK ASSESSMENT / PLAN CSW met with patient. CSW introduced self and explained role. CSW explained SNF placement process and discussed d/c plan with patient. Patient states he lives with wife. 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 Per patient, Medicare provides hospital benefit only. Patient states he has been to [facility] in [place], [place] and would prefer to return. Patient states he has been to facility before and received great service. Patient expressed concern with not being familiar with [facility]. CSW acknowledged patient's concerns and educated patient on the benefits of SNF and informed patient quality care is available at [facility]. CSW encouraged patient to visit Medicare.gov website to view ratings of facilities and visit facility website. Patient states his wife is an Therapist, sports and will visit facilities and ask questions before they make a decision. Patient states his wife will provide insurance information.    CSW contacted patient's wife who provided insurance information. Per wife, she has only been married to patient for a short time, and is not familiar with prior SNF placement. Patient's wife is agreeable  to [facility] referral as well as patient request. CSW informed wife community SNF list has been provided to patient.   Assessment/plan status:  Other - See comment Other assessment/ plan:   CSW to submit PASARR and complete FL2 for SNF placement.   Information/referral to community resources:   CSW provided patient with [facility] list.    PATIENT'S/FAMILY'S RESPONSE TO PLAN OF CARE: Patient agreeable to SNF placement. Patient understands benefits of SNF and wants to "heal quickly from all this."   [name] [name], [place] Weekend Clinical Social Worker [phone]

## 2014-11-23 NOTE — Progress Notes (Signed)
Pt requested to have suppository this am.  Once suppository removed, pt requested to rest before administrating.  Pt continues to refuse medication.  Medication returned to Pyxis at this time.

## 2014-11-23 NOTE — Progress Notes (Signed)
Dr. Margarita Ranaimothy Murphy aware of pt's BM being yellow and mucus-like yesterday.  No new orders at this time.

## 2014-11-23 NOTE — Progress Notes (Signed)
Patient was unable to void on his own during night shift.  We tried warm compressions and running water but no success.  Patient was I/O cath during day shift 11/22/14 at 1845.  At 0245 am on 2/13 16, we did a bladder scan, showed 667 mL of urine.  At 0317 we removed 900 mL of urine from bladder.
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
## 2014-11-23 NOTE — Progress Notes (Signed)
Patient ID: Retta Dionesnthony Lorensen, male   DOB: 03/24/1971, 44 y.o.   MRN: [id number]     Subjective:  Patient reports pain as mild.  Patient denies any CP or SOB.    Objective:   VITALS:   Filed Vitals:   11/22/14 2121 11/23/14 0700 11/23/14 0953 11/23/14 1343  BP: 129/69 130/68 141/76 132/72  Pulse: 105 102 102 101  Temp: 99.1 F (37.3 C) 99.5 F (37.5 C)  98.3 F (36.8 C)  TempSrc:  Oral  Oral  Resp: 18 18  18   SpO2: 98% 97%  98%    ABD soft Sensation intact distally Dorsiflexion/Plantar flexion intact Incision: dressing C/D/I and no drainage LUE: NVI distally, splint intact  Lab Results  Component Value Date   WBC 9.7 11/17/2014   HGB 15.6 11/17/2014   HCT 43.0 11/17/2014   MCV 87.9 11/17/2014   PLT 265 11/17/2014     Assessment/Plan: 2 Days Post-Op   Principal Problem:   Suicide attempt Active Problems:   Compartment syndrome, traumatic, lower extremity   Open right tibial fracture   Penetrating foreign body of right knee   Right tibial fracture   Advance diet  Continue non weight bearing on the right lower ext Continue wound vac on right lower ext Plan for IM nailing and possible wound closure on right lower ext. Tuesday next week. Continue short arm Splint left upper ext. Patient needs to continue Ice and elevation Sling left upper ext.    Margarita RanaMURPHY, Halsey Persaud, D 11/23/2014, 3:09 PM   Margarita Ranaimothy Jamieson Hetland MD [phone]

## 2014-11-24 LAB — GLUCOSE, CAPILLARY
GLUCOSE-CAPILLARY: 254 mg/dL — AB (ref 70–99)
GLUCOSE-CAPILLARY: 242 mg/dL — AB (ref 70–99)
GLUCOSE-CAPILLARY: 232 mg/dL — AB (ref 70–99)
Glucose-Capillary: 206 mg/dL — ABNORMAL HIGH (ref 70–99)

## 2014-11-24 NOTE — Progress Notes (Signed)
    Subjective:  Patient reports pain as well controlled. He is out of bed and resting comfortably in a chair this morning.  He is concerned that he has not been able to have a BM yesterday or this morning.  His nurse has given him 2 suppositories to try to help.  He still has the foley catheter in to help with voiding.  Objective:   VITALS:   Filed Vitals:   11/23/14 0953 11/23/14 1343 11/23/14 1949 11/24/14 0417  BP: 141/76 132/72 137/85 134/63  Pulse: 102 101 95 101  Temp:  98.3 F (36.8 C) 98.3 F (36.8 C) 100.1 F (37.8 C)  TempSrc:  Oral Oral Oral  Resp:  18 18 18  SpO2:  98% 100% 100%    Physical Exam Abdomen is soft and nontender Foley catheter in place and draining urine  Dressing: C/D/I  Compartments soft  SILT M/R/U, 2+rad puls, +EPL/FPL/IO  SILT DP/SP/S/S/T, 2+DP, +TA/GS/EHL  LABS  Results for orders placed or performed during the hospital encounter of 11/17/14 (from the past 24 hour(s))  Glucose, capillary     Status: Abnormal   Collection Time: 11/23/14  8:08 AM  Result Value Ref Range   Glucose-Capillary 222 (H) 70 - 99 mg/dL  Urinalysis, Routine w reflex microscopic     Status: Abnormal   Collection Time: 11/23/14  9:49 AM  Result Value Ref Range   Color, Urine YELLOW YELLOW   APPearance CLEAR CLEAR   Specific Gravity, Urine 1.012 1.005 - 1.030   pH 6.5 5.0 - 8.0   Glucose, UA 250 (A) NEGATIVE mg/dL   Hgb urine dipstick NEGATIVE NEGATIVE   Bilirubin Urine NEGATIVE NEGATIVE   Ketones, ur NEGATIVE NEGATIVE mg/dL   Protein, ur NEGATIVE NEGATIVE mg/dL   Urobilinogen, UA 0.2 0.0 - 1.0 mg/dL   Nitrite NEGATIVE NEGATIVE   Leukocytes, UA NEGATIVE NEGATIVE  Glucose, capillary     Status: Abnormal   Collection Time: 11/23/14 12:30 PM  Result Value Ref Range   Glucose-Capillary 226 (H) 70 - 99 mg/dL  Glucose, capillary     Status: Abnormal   Collection Time: 11/23/14  5:23 PM  Result Value Ref Range   Glucose-Capillary 209 (H) 70 - 99 mg/dL  Glucose,  capillary     Status: Abnormal   Collection Time: 11/23/14  9:50 PM  Result Value Ref Range   Glucose-Capillary 191 (H) 70 - 99 mg/dL  Glucose, capillary     Status: Abnormal   Collection Time: 11/24/14  5:54 AM  Result Value Ref Range   Glucose-Capillary 254 (H) 70 - 99 mg/dL     Assessment/Plan: Principal Problem:   Suicide attempt Active Problems:   Compartment syndrome, traumatic, lower extremity   Open right tibial fracture   Penetrating foreign body of right knee   Right tibial fracture   PLAN: Weight Bearing: NWB RLE Encouraged the patient to try to transition to oral pain medication with IV dilaudid more for break through pain.  He is agreeable to trying this transition.  Will pull the foley catheter today.  If the patient continues to have difficulty with urination will need to consult urology.  Will place an order for an enema to help the patient move his bowels if the suppositories don't help today.    Kida Digiulio, D 11/24/2014, 7:41 AM   Denver Bentson, MD Cell [phone]  

## 2014-11-25 LAB — GLUCOSE, CAPILLARY
GLUCOSE-CAPILLARY: 187 mg/dL — AB (ref 70–99)
Glucose-Capillary: 216 mg/dL — ABNORMAL HIGH (ref 70–99)
Glucose-Capillary: 224 mg/dL — ABNORMAL HIGH (ref 70–99)
Glucose-Capillary: 261 mg/dL — ABNORMAL HIGH (ref 70–99)

## 2014-11-25 MED ORDER — FLEET ENEMA 7-19 GM/118ML RE ENEM
1.0000 | ENEMA | Freq: Once | RECTAL | Status: AC
  Administered 2014-11-25: 1 via RECTAL
  Filled 2014-11-25: qty 1
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
## 2014-11-25 NOTE — Progress Notes (Signed)
Rehab Admissions Coordinator Note:  Patient was screened by Dione Mccombie L for appropriateness for an Inpatient Acute Rehab Consult.  At this time, we are recommending Inpatient Rehab consult.  Hall Birchard L 11/25/2014, 9:01 AM  I can be reached at [phone].
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
## 2014-11-25 NOTE — Progress Notes (Signed)
     Subjective:  Patient reports pain as moderate.  The patient had the foley catheter removed yesterday and was able to urinate without difficulty.  He still complains of not being able to have a BM.  An enema was ordered yesterday but the patient stated he would like to wait longer before having it.    Objective:   VITALS:   Filed Vitals:   11/24/14 0956 11/24/14 1939 11/25/14 0614 11/25/14 1352  BP: 141/67 140/71 143/76 120/70  Pulse:  101 92 95  Temp:  98.6 F (37 C) 97.9 F (36.6 C) 98.3 F (36.8 C)  TempSrc:  Oral Oral Oral  Resp:  18 18 20  SpO2:  98% 100% 99%    Neurologically intact ABD soft Neurovascular intact Sensation intact distally Intact pulses distally Incision: dressing C/D/I  Lab Results  Component Value Date   WBC 9.7 11/17/2014   HGB 15.6 11/17/2014   HCT 43.0 11/17/2014   MCV 87.9 11/17/2014   PLT 265 11/17/2014   BMET    Component Value Date/Time   NA 134* 11/20/2014 0615   K 3.4* 11/20/2014 0615   CL 98 11/20/2014 0615   CO2 26 11/20/2014 0615   GLUCOSE 277* 11/20/2014 0615   BUN 16 11/20/2014 0615   CREATININE 0.98 11/20/2014 0615   CALCIUM 7.9* 11/20/2014 0615   GFRNONAA >90 11/20/2014 0615   GFRAA >90 11/20/2014 0615     Assessment/Plan: 4 Days Post-Op   Principal Problem:   Suicide attempt Active Problems:   Compartment syndrome, traumatic, lower extremity   Open right tibial fracture   Penetrating foreign body of right knee   Right tibial fracture   Up with therapy   Plan is to take back to the OR tomorrow IM nail right tibia and possible wound closure. Patient will likely need SNF after D/C.     Niles Ess Marie 11/25/2014, 4:24 PM   Saja Bartolini PA-C [phone]    

## 2014-11-26 ENCOUNTER — Inpatient Hospital Stay (HOSPITAL_COMMUNITY): Payer: Federal, State, Local not specified - PPO | Admitting: Anesthesiology

## 2014-11-26 ENCOUNTER — Encounter (HOSPITAL_COMMUNITY)
Admission: EM | Disposition: A | Discharge status: Rehabilitation Facility | Payer: Self-pay | Source: Home / Self Care | Attending: Orthopedic Surgery

## 2014-11-26 ENCOUNTER — Inpatient Hospital Stay (HOSPITAL_COMMUNITY): Payer: Federal, State, Local not specified - PPO

## 2014-11-26 HISTORY — PX: SECONDARY CLOSURE OF WOUND: SHX6208

## 2014-11-26 HISTORY — PX: APPLICATION OF WOUND VAC: SHX5189

## 2014-11-26 HISTORY — PX: TIBIA IM NAIL INSERTION: SHX2516

## 2014-11-26 LAB — GLUCOSE, CAPILLARY
GLUCOSE-CAPILLARY: 189 mg/dL — AB (ref 70–99)
GLUCOSE-CAPILLARY: 202 mg/dL — AB (ref 70–99)
Glucose-Capillary: 211 mg/dL — ABNORMAL HIGH (ref 70–99)
Glucose-Capillary: 285 mg/dL — ABNORMAL HIGH (ref 70–99)

## 2014-11-26 SURGERY — SECONDARY CLOSURE OF WOUND
Anesthesia: General | Laterality: Right

## 2014-11-26 SURGERY — INSERTION, INTRAMEDULLARY ROD, TIBIA
Anesthesia: General | Site: Leg Lower | Laterality: Right

## 2014-11-26 MED ORDER — SUCCINYLCHOLINE CHLORIDE 20 MG/ML IJ SOLN
INTRAMUSCULAR | Status: DC | PRN
  Administered 2014-11-26: 100 mg via INTRAVENOUS

## 2014-11-26 MED ORDER — ROCURONIUM BROMIDE 100 MG/10ML IV SOLN
INTRAVENOUS | Status: DC | PRN
  Administered 2014-11-26: 20 mg via INTRAVENOUS

## 2014-11-26 MED ORDER — CEFAZOLIN SODIUM-DEXTROSE 2-3 GM-% IV SOLR
INTRAVENOUS | Status: AC
  Filled 2014-11-26: qty 50

## 2014-11-26 MED ORDER — LIDOCAINE HCL (CARDIAC) 20 MG/ML IV SOLN
INTRAVENOUS | Status: DC | PRN
  Administered 2014-11-26: 100 mg via INTRAVENOUS

## 2014-11-26 MED ORDER — HYDROMORPHONE HCL 1 MG/ML IJ SOLN
INTRAMUSCULAR | Status: DC | PRN
  Administered 2014-11-26: 1 mg via INTRAVENOUS

## 2014-11-26 MED ORDER — MIDAZOLAM HCL 2 MG/2ML IJ SOLN
INTRAMUSCULAR | Status: AC
  Filled 2014-11-26: qty 2

## 2014-11-26 MED ORDER — FENTANYL CITRATE 0.05 MG/ML IJ SOLN
INTRAMUSCULAR | Status: DC | PRN
  Administered 2014-11-26: 100 ug via INTRAVENOUS
  Administered 2014-11-26: 50 ug via INTRAVENOUS
  Administered 2014-11-26: 100 ug via INTRAVENOUS
  Administered 2014-11-26 (×7): 50 ug via INTRAVENOUS
  Administered 2014-11-26: 25 ug via INTRAVENOUS
  Administered 2014-11-26: 50 ug via INTRAVENOUS
  Administered 2014-11-26: 25 ug via INTRAVENOUS
  Administered 2014-11-26: 50 ug via INTRAVENOUS

## 2014-11-26 MED ORDER — PROPOFOL 10 MG/ML IV BOLUS
INTRAVENOUS | Status: DC | PRN
  Administered 2014-11-26: 200 mg via INTRAVENOUS

## 2014-11-26 MED ORDER — FENTANYL CITRATE 0.05 MG/ML IJ SOLN
INTRAMUSCULAR | Status: AC
  Filled 2014-11-26: qty 5

## 2014-11-26 MED ORDER — ONDANSETRON HCL 4 MG/2ML IJ SOLN: 4.0000 mg | Freq: Once | INTRAMUSCULAR | Status: DC | PRN

## 2014-11-26 MED ORDER — PROPOFOL 10 MG/ML IV BOLUS
INTRAVENOUS | Status: AC
  Filled 2014-11-26: qty 20

## 2014-11-26 MED ORDER — LACTATED RINGERS IV SOLN
INTRAVENOUS | Status: DC
  Administered 2014-11-26 – 2014-11-28 (×2): via INTRAVENOUS

## 2014-11-26 MED ORDER — ROCURONIUM BROMIDE 50 MG/5ML IV SOLN
INTRAVENOUS | Status: AC
  Filled 2014-11-26: qty 1

## 2014-11-26 MED ORDER — BACITRACIN-NEOMYCIN-POLYMYXIN 400-5-5000 EX OINT
TOPICAL_OINTMENT | CUTANEOUS | Status: AC
  Filled 2014-11-26: qty 1

## 2014-11-26 MED ORDER — 0.9 % SODIUM CHLORIDE (POUR BTL) OPTIME
TOPICAL | Status: DC | PRN
  Administered 2014-11-26: 1000 mL

## 2014-11-26 MED ORDER — CEFAZOLIN SODIUM-DEXTROSE 2-3 GM-% IV SOLR
INTRAVENOUS | Status: DC | PRN
  Administered 2014-11-26: 2 g via INTRAVENOUS

## 2014-11-26 MED ORDER — HYDROMORPHONE HCL 1 MG/ML IJ SOLN
INTRAMUSCULAR | Status: AC
  Filled 2014-11-26: qty 1

## 2014-11-26 MED ORDER — CEFAZOLIN SODIUM-DEXTROSE 2-3 GM-% IV SOLR
2.0000 g | Freq: Four times a day (QID) | INTRAVENOUS | Status: AC
  Administered 2014-11-26 – 2014-11-27 (×3): 2 g via INTRAVENOUS
  Filled 2014-11-26 (×3): qty 50

## 2014-11-26 MED ORDER — ARTIFICIAL TEARS OP OINT
TOPICAL_OINTMENT | OPHTHALMIC | Status: AC
  Filled 2014-11-26: qty 3.5

## 2014-11-26 MED ORDER — FENTANYL CITRATE 0.05 MG/ML IJ SOLN
25.0000 ug | INTRAMUSCULAR | Status: DC | PRN
  Administered 2014-11-26 (×2): 50 ug via INTRAVENOUS

## 2014-11-26 MED ORDER — MIDAZOLAM HCL 5 MG/5ML IJ SOLN
INTRAMUSCULAR | Status: DC | PRN
  Administered 2014-11-26: 2 mg via INTRAVENOUS

## 2014-11-26 MED ORDER — DEXTROSE-NACL 5-0.45 % IV SOLN
INTRAVENOUS | Status: DC
  Administered 2014-11-26 – 2014-11-27 (×2): via INTRAVENOUS

## 2014-11-26 MED ORDER — ONDANSETRON HCL 4 MG/2ML IJ SOLN
INTRAMUSCULAR | Status: AC
  Filled 2014-11-26: qty 2

## 2014-11-26 MED ORDER — NEOSTIGMINE METHYLSULFATE 10 MG/10ML IV SOLN
INTRAVENOUS | Status: DC | PRN
  Administered 2014-11-26: 3 mg via INTRAVENOUS

## 2014-11-26 MED ORDER — FENTANYL CITRATE 0.05 MG/ML IJ SOLN
INTRAMUSCULAR | Status: AC
  Filled 2014-11-26: qty 2

## 2014-11-26 MED ORDER — GLYCOPYRROLATE 0.2 MG/ML IJ SOLN
INTRAMUSCULAR | Status: DC | PRN
  Administered 2014-11-26: 0.4 mg via INTRAVENOUS
  Administered 2014-11-26: 0.2 mg via INTRAVENOUS

## 2014-11-26 MED ORDER — ONDANSETRON HCL 4 MG/2ML IJ SOLN
INTRAMUSCULAR | Status: DC | PRN
  Administered 2014-11-26: 4 mg via INTRAVENOUS

## 2014-11-26 SURGICAL SUPPLY — 83 items
6X15 ACE ×2 IMPLANT
BANDAGE ELASTIC 4 VELCRO ST LF (GAUZE/BANDAGES/DRESSINGS) ×4 IMPLANT
BANDAGE ELASTIC 6 VELCRO ST LF (GAUZE/BANDAGES/DRESSINGS) ×4 IMPLANT
BIT DRILL AO GAMMA 4.2X180 (BIT) ×2 IMPLANT
BIT DRILL AO GAMMA 4.2X340 (BIT) ×2 IMPLANT
BIT DRILL CANN 2.7 (BIT) ×1
BIT DRILL SRG 2.7XCANN AO CPLG (BIT) ×1 IMPLANT
BIT DRL SRG 2.7XCANN AO CPLNG (BIT) ×1
BLADE SURG 10 STRL SS (BLADE) IMPLANT
BNDG COHESIVE 3X5 TAN STRL LF (GAUZE/BANDAGES/DRESSINGS) ×2 IMPLANT
BNDG COHESIVE 4X5 TAN STRL (GAUZE/BANDAGES/DRESSINGS) ×2 IMPLANT
BNDG GAUZE ELAST 4 BULKY (GAUZE/BANDAGES/DRESSINGS) ×2 IMPLANT
CANISTER WOUND CARE 500ML ATS (WOUND CARE) ×2 IMPLANT
CHLORAPREP W/TINT 26ML (MISCELLANEOUS) IMPLANT
CONNECTOR Y ATS VAC SYSTEM (MISCELLANEOUS) ×2 IMPLANT
COVER MAYO STAND STRL (DRAPES) ×2 IMPLANT
COVER SURGICAL LIGHT HANDLE (MISCELLANEOUS) ×2 IMPLANT
CUFF TOURNIQUET SINGLE 34IN LL (TOURNIQUET CUFF) IMPLANT
CUFF TOURNIQUET SINGLE 44IN (TOURNIQUET CUFF) IMPLANT
DRAPE C-ARM 42X72 X-RAY (DRAPES) ×2 IMPLANT
DRAPE C-ARMOR (DRAPES) IMPLANT
DRAPE IMP U-DRAPE 54X76 (DRAPES) ×2 IMPLANT
DRAPE INCISE IOBAN 66X45 STRL (DRAPES) ×2 IMPLANT
DRAPE PROXIMA HALF (DRAPES) ×2 IMPLANT
DRESSING OPSITE X SMALL 2X3 (GAUZE/BANDAGES/DRESSINGS) ×2 IMPLANT
DRSG ADAPTIC 3X8 NADH LF (GAUZE/BANDAGES/DRESSINGS) ×2 IMPLANT
DRSG TEGADERM 4X4.75 (GAUZE/BANDAGES/DRESSINGS) ×4 IMPLANT
DRSG VAC ATS MED SENSATRAC (GAUZE/BANDAGES/DRESSINGS) ×2 IMPLANT
ELECT CAUTERY BLADE 6.4 (BLADE) ×2 IMPLANT
ELECT REM PT RETURN 9FT ADLT (ELECTROSURGICAL) ×2
ELECTRODE REM PT RTRN 9FT ADLT (ELECTROSURGICAL) ×1 IMPLANT
GAUZE SPONGE 4X4 12PLY STRL (GAUZE/BANDAGES/DRESSINGS) ×2 IMPLANT
GLOVE BIO SURGEON STRL SZ 6.5 (GLOVE) ×2 IMPLANT
GLOVE BIO SURGEON STRL SZ7.5 (GLOVE) ×4 IMPLANT
GLOVE BIO SURGEON STRL SZ8.5 (GLOVE) ×2 IMPLANT
GLOVE BIOGEL PI IND STRL 8 (GLOVE) ×2 IMPLANT
GLOVE BIOGEL PI INDICATOR 8 (GLOVE) ×2
GLOVE BIOGEL PI ORTHO PRO SZ8 (GLOVE) ×1
GLOVE PI ORTHO PRO STRL SZ8 (GLOVE) ×1 IMPLANT
GOWN STRL REUS W/ TWL LRG LVL3 (GOWN DISPOSABLE) ×3 IMPLANT
GOWN STRL REUS W/TWL LRG LVL3 (GOWN DISPOSABLE) ×3
GUIDEWIRE GAMMA (WIRE) ×2 IMPLANT
GUIDEWIRE GAMMA 800 (WIRE) ×2 IMPLANT
K-WIRE ORTHOPEDIC 1.4X150L (WIRE) ×6
KIT BASIN OR (CUSTOM PROCEDURE TRAY) ×2 IMPLANT
KIT ROOM TURNOVER OR (KITS) ×2 IMPLANT
KWIRE ORTHOPEDIC 1.4X150L (WIRE) ×3 IMPLANT
MANIFOLD NEPTUNE II (INSTRUMENTS) ×2 IMPLANT
NAIL IM TIBIAL T2 10X34.5 (Nail) ×1 IMPLANT
NAIL TIBIAL 10X34.5 (Nail) ×2 IMPLANT
NS IRRIG 1000ML POUR BTL (IV SOLUTION) ×2 IMPLANT
PACK ORTHO EXTREMITY (CUSTOM PROCEDURE TRAY) ×2 IMPLANT
PACK UNIVERSAL I (CUSTOM PROCEDURE TRAY) ×2 IMPLANT
PAD ARMBOARD 7.5X6 YLW CONV (MISCELLANEOUS) ×4 IMPLANT
PAD CAST 4YDX4 CTTN HI CHSV (CAST SUPPLIES) ×1 IMPLANT
PAD NEG PRESSURE SENSATRAC (MISCELLANEOUS) ×2 IMPLANT
PADDING CAST COTTON 4X4 STRL (CAST SUPPLIES) ×1
REAMER SHAFT BIXCUT (INSTRUMENTS) ×2 IMPLANT
SCREW CANN PT RVRS CUT FLT 55X (Screw) ×2 IMPLANT
SCREW CANNULATED 4.0X55MM (Screw) ×2 IMPLANT
SCREW CANNULATED 4.0X70MM (Screw) ×2 IMPLANT
SCREW GAMMA (Screw) ×2 IMPLANT
SCREW LOCK FULL THREAD 05X57.5 (Screw) ×2 IMPLANT
SCREW LOCKING FULL THREAD 5X52 (Screw) ×2 IMPLANT
SCREW LOCKING T2 F/T  5X42.5MM (Screw) ×1 IMPLANT
SCREW LOCKING T2 F/T 5X42.5MM (Screw) ×1 IMPLANT
SCREW LOCKING THREADED 5X47.5 (Screw) ×2 IMPLANT
SPONGE GAUZE 4X4 12PLY STER LF (GAUZE/BANDAGES/DRESSINGS) ×2 IMPLANT
SPONGE LAP 18X18 X RAY DECT (DISPOSABLE) IMPLANT
STAPLER VISISTAT 35W (STAPLE) IMPLANT
SUT ETHILON 3 0 FSL (SUTURE) ×14 IMPLANT
SUT MNCRL AB 3-0 PS2 18 (SUTURE) ×2 IMPLANT
SUT MNCRL AB 4-0 PS2 18 (SUTURE) IMPLANT
SUT MON AB 2-0 CT1 27 (SUTURE) ×2 IMPLANT
SUT VIC AB 0 CT1 27 (SUTURE) ×3
SUT VIC AB 0 CT1 27XBRD ANBCTR (SUTURE) ×3 IMPLANT
SYR BULB IRRIGATION 50ML (SYRINGE) ×2 IMPLANT
TOWEL OR 17X24 6PK STRL BLUE (TOWEL DISPOSABLE) ×2 IMPLANT
TOWEL OR 17X26 10 PK STRL BLUE (TOWEL DISPOSABLE) ×2 IMPLANT
TOWEL OR NON WOVEN STRL DISP B (DISPOSABLE) ×2 IMPLANT
TUBE CONNECTING 12X1/4 (SUCTIONS) ×2 IMPLANT
WATER STERILE IRR 1000ML POUR (IV SOLUTION) ×2 IMPLANT
YANKAUER SUCT BULB TIP NO VENT (SUCTIONS) ×2 IMPLANT

## 2014-11-26 NOTE — Progress Notes (Signed)
PT Cancellation Note  Patient Details Name: [name] [name] MRN: [id number] DOB: 05/25/1971   Cancelled Treatment:    Reason Eval/Treat Not Completed: Other (comment) (planned for surgery today. will re-eval after. )    [name] [name], [name] [name], [place]  [phone] 11/26/2014, 9:21 AM

## 2014-11-26 NOTE — Transfer of Care (Signed)
Immediate Anesthesia Transfer of Care Note  Patient: [name] [name]  Procedure(s) Performed: Procedure(s): INTRAMEDULLARY (IM) NAIL TIBIAL, HARDWARE REMOVAL, AND TIBIAL PLATEAU (Right) SECONDARY CLOSURE OF WOUND (Right) APPLICATION OF WOUND VAC (Right)  Patient Location: PACU  Anesthesia Type:General  Level of Consciousness: awake, alert  and oriented  Airway & Oxygen Therapy: Patient Spontanous Breathing and Patient connected to nasal cannula oxygen  Post-op Assessment: Report given to RN and Post -op Vital signs reviewed and stable  Post vital signs: Reviewed and stable  Last Vitals:  Filed Vitals:   11/26/14 1527  BP: 144/73  Pulse: 103  Temp: 36.7 C  Resp: 31    Complications: No apparent anesthesia complications
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
## 2014-11-26 NOTE — Progress Notes (Signed)
Inpatient Diabetes Program Recommendations  AACE/ADA: New Consensus Statement on Inpatient Glycemic Control (2013)  Target Ranges:  Prepandial:   less than 140 mg/dL      Peak postprandial:   less than 180 mg/dL (1-2 hours)      Critically ill patients:  140 - 180 mg/dL    Results for Retta DionesCESTARE, Abhiram (MRN [id number]) as of 11/26/2014 09:38  Ref. Range 11/25/2014 06:13 11/25/2014 12:40 11/25/2014 16:29 11/25/2014 21:23 11/26/2014 06:23  Glucose-Capillary Latest Range: 70-99 mg/dL 409216 (H) 811224 (H) 914187 (H) 261 (H) 189 (H)   Patient's glucose is trending in the 200's mg/dl. For better post op healing and outcomes, please consider the following:  Inpatient Diabetes Program Recommendations Insulin - Meal Coverage: Noted patient is in surgery this am. MD: Please consider ordering Novolog 4 units TID with meals for meal coverage in addition to correction scale when the patient is consuming 50% or more of meals. HgbA1C: Please consider ordering an A1c to assess blood glucose over the past 2-3 months.  Thanks,  Christena DeemShannon Chaden Doom RN, MSN, [facility] Inpatient Diabetes Coordinator Team Pager [phone]

## 2014-11-26 NOTE — H&P (View-Only) (Signed)
     Subjective:  Patient reports pain as moderate.  The patient had the foley catheter removed yesterday and was able to urinate without difficulty.  He still complains of not being able to have a BM.  An enema was ordered yesterday but the patient stated he would like to wait longer before having it.    Objective:   VITALS:   Filed Vitals:   11/24/14 0956 11/24/14 1939 11/25/14 0614 11/25/14 1352  BP: 141/67 140/71 143/76 120/70  Pulse:  101 92 95  Temp:  98.6 F (37 C) 97.9 F (36.6 C) 98.3 F (36.8 C)  TempSrc:  Oral Oral Oral  Resp:  18 18 20   SpO2:  98% 100% 99%    Neurologically intact ABD soft Neurovascular intact Sensation intact distally Intact pulses distally Incision: dressing C/D/I  Lab Results  Component Value Date   WBC 9.7 11/17/2014   HGB 15.6 11/17/2014   HCT 43.0 11/17/2014   MCV 87.9 11/17/2014   PLT 265 11/17/2014   BMET    Component Value Date/Time   NA 134* 11/20/2014 0615   K 3.4* 11/20/2014 0615   CL 98 11/20/2014 0615   CO2 26 11/20/2014 0615   GLUCOSE 277* 11/20/2014 0615   BUN 16 11/20/2014 0615   CREATININE 0.98 11/20/2014 0615   CALCIUM 7.9* 11/20/2014 0615   GFRNONAA >90 11/20/2014 0615   GFRAA >90 11/20/2014 0615     Assessment/Plan: 4 Days Post-Op   Principal Problem:   Suicide attempt Active Problems:   Compartment syndrome, traumatic, lower extremity   Open right tibial fracture   Penetrating foreign body of right knee   Right tibial fracture   Up with therapy   Plan is to take back to the OR tomorrow IM nail right tibia and possible wound closure. Patient will likely need SNF after D/C.     Dereonna Lensing Hilda LiasMarie 11/25/2014, 4:24 PM   Janalee DaneBrittney Ceejay Kegley PA-C [phone]

## 2014-11-26 NOTE — Anesthesia Postprocedure Evaluation (Signed)
  Anesthesia Post-op Note  Patient: [name] [name]  Procedure(s) Performed: Procedure(s): INTRAMEDULLARY (IM) NAIL TIBIAL, HARDWARE REMOVAL, AND TIBIAL PLATEAU (Right) SECONDARY CLOSURE OF WOUND (Right) APPLICATION OF WOUND VAC (Right)  Patient Location: PACU  Anesthesia Type:General  Level of Consciousness: awake, alert , oriented and patient cooperative  Airway and Oxygen Therapy: Patient Spontanous Breathing and Patient connected to nasal cannula oxygen  Post-op Pain: mild, tolerable  Post-op Assessment: Post-op Vital signs reviewed, Patient's Cardiovascular Status Stable, Respiratory Function Stable, Patent Airway, No signs of Nausea or vomiting and Pain level controlled  Post-op Vital Signs: Reviewed and stable  Last Vitals:  Filed Vitals:   11/26/14 1600  BP: 154/62  Pulse: 95  Temp:   Resp: 10    Complications: No apparent anesthesia complications

## 2014-11-26 NOTE — Anesthesia Procedure Notes (Signed)
Procedure Name: Intubation Date/Time: 11/26/2014 12:24 PM Performed by: Margaree MackintoshYACOUB, Allysson Rinehimer B Pre-anesthesia Checklist: Patient identified, Emergency Drugs available, Suction available, Patient being monitored and Timeout performed Patient Re-evaluated:Patient Re-evaluated prior to inductionOxygen Delivery Method: Circle system utilized Preoxygenation: Pre-oxygenation with 100% oxygen Intubation Type: IV induction Ventilation: Oral airway inserted - appropriate to patient size and Mask ventilation without difficulty Grade View: Grade I Tube size: 7.5 mm Number of attempts: 1 Airway Equipment and Method: Video-laryngoscopy Placement Confirmation: positive ETCO2,  ETT inserted through vocal cords under direct vision and breath sounds checked- equal and bilateral Secured at: 21 cm Tube secured with: Tape Dental Injury: Teeth and Oropharynx as per pre-operative assessment
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
## 2014-11-27 DIAGNOSIS — E1165 Type 2 diabetes mellitus with hyperglycemia: Secondary | ICD-10-CM | POA: Diagnosis present

## 2014-11-27 DIAGNOSIS — IMO0002 Reserved for concepts with insufficient information to code with codable children: Secondary | ICD-10-CM | POA: Diagnosis present

## 2014-11-27 DIAGNOSIS — E1140 Type 2 diabetes mellitus with diabetic neuropathy, unspecified: Secondary | ICD-10-CM | POA: Diagnosis present

## 2014-11-27 DIAGNOSIS — I10 Essential (primary) hypertension: Secondary | ICD-10-CM | POA: Diagnosis present

## 2014-11-27 LAB — GLUCOSE, CAPILLARY
GLUCOSE-CAPILLARY: 277 mg/dL — AB (ref 70–99)
GLUCOSE-CAPILLARY: 341 mg/dL — AB (ref 70–99)
Glucose-Capillary: 258 mg/dL — ABNORMAL HIGH (ref 70–99)
Glucose-Capillary: 191 mg/dL — ABNORMAL HIGH (ref 70–99)

## 2014-11-27 MED ORDER — INSULIN ASPART 100 UNIT/ML ~~LOC~~ SOLN
0.0000 [IU] | Freq: Three times a day (TID) | SUBCUTANEOUS | Status: DC
  Administered 2014-11-27: 3 [IU] via SUBCUTANEOUS
  Administered 2014-11-28: 8 [IU] via SUBCUTANEOUS
  Administered 2014-11-28 (×2): 3 [IU] via SUBCUTANEOUS
  Administered 2014-11-29 – 2014-11-30 (×4): 5 [IU] via SUBCUTANEOUS
  Administered 2014-11-30 (×2): 11 [IU] via SUBCUTANEOUS
  Administered 2014-12-01: 2 [IU] via SUBCUTANEOUS
  Administered 2014-12-01 (×2): 3 [IU] via SUBCUTANEOUS
  Administered 2014-12-02: 5 [IU] via SUBCUTANEOUS
  Administered 2014-12-02: 3 [IU] via SUBCUTANEOUS

## 2014-11-27 MED ORDER — TAMSULOSIN HCL 0.4 MG PO CAPS
0.4000 mg | ORAL_CAPSULE | Freq: Every day | ORAL | Status: DC
  Administered 2014-11-27 – 2014-12-02 (×6): 0.4 mg via ORAL
  Filled 2014-11-27 (×6): qty 1

## 2014-11-27 MED ORDER — INSULIN ASPART 100 UNIT/ML ~~LOC~~ SOLN
0.0000 [IU] | Freq: Every day | SUBCUTANEOUS | Status: DC
  Administered 2014-11-27: 4 [IU] via SUBCUTANEOUS
  Administered 2014-11-28: 3 [IU] via SUBCUTANEOUS
  Administered 2014-11-29 – 2014-11-30 (×2): 2 [IU] via SUBCUTANEOUS

## 2014-11-27 NOTE — Clinical Social Work Psych Note (Signed)
Psych CSW was consulted to [employer] sitter if psychiatrically stable.  Psych CSW contacted psychiatry and was advise that safety/suicide sitter has been discharged.  Disposition: SNF once medically stable  Vickii PennaGina Argie Applegate, LCSWA [phone]  Psychiatric & Orthopedics (5N 1-16) Clinical Social Worker

## 2014-11-27 NOTE — Progress Notes (Signed)
Inpatient Diabetes Program Recommendations  AACE/ADA: New Consensus Statement on Inpatient Glycemic Control (2013)  Target Ranges:  Prepandial:   less than 140 mg/dL      Peak postprandial:   less than 180 mg/dL (1-2 hours)      Critically ill patients:  140 - 180 mg/dL   Results for [name] [name], Goebel (MRN [id number]) as of 11/27/2014 10:46  Ref. Range 11/26/2014 06:23 11/26/2014 11:09 11/26/2014 15:47 11/26/2014 21:53 11/27/2014 06:41  Glucose-Capillary Latest Range: 70-99 mg/dL 440189 (H) 102202 (H) 725211 (H) 285 (H) 258 (H)    Diabetes history: DM 2 Outpatient Diabetes medications: Metformin 1,000 mg BID Current orders for Inpatient glycemic control: metformin 1,000 mg Daily, Novolog 0-15 units TID  Inpatient Diabetes Program Recommendations  Insulin - Basal: Patient's fasting glucose this am was 258 mg/dl. Please consider ordering Levemir 10 units Q24hrs for adequate glucose control and post op outcomes. Correction (SSI): Please considering Novolog 0-5 units QHS for bedtime coverage. HgbA1C: Please consider ordering an A1c to assess blood glucose over the past 2-3 months.  Thanks,  Christena DeemShannon Shraga Custard RN, MSN, [facility] Inpatient Diabetes Coordinator Team Pager [phone]
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
## 2014-11-27 NOTE — Progress Notes (Signed)
OT Cancellation Note  Patient Details Name: [name] [name] MRN: [id number] DOB: 09/13/1971   Cancelled Treatment:    Reason Eval/Treat Not Completed: Patient declined, no reason specified. PT working with patient and stated patient refusing getting OOB at this time secondary to "doctor told me to stay in bed today". Will re-attempt OT re-eval/treat as able.   Hrithik Boschee 11/27/2014, 10:57 AM

## 2014-11-27 NOTE — Progress Notes (Signed)
     Subjective:  Patient reports pain as moderate.  He was unable to urinate post-op yesterday, so a foley was placed last night.    Objective:   VITALS:   Filed Vitals:   11/26/14 1638 11/26/14 2200 11/27/14 0058 11/27/14 0549  BP: 148/67 137/72 156/76 148/72  Pulse: 102 109 103 96  Temp: 98.5 F (36.9 C) 98.3 F (36.8 C) 98.8 F (37.1 C) 98.6 F (37 C)  TempSrc:      Resp: 16 16 16 16   SpO2: 97% 97% 98% 97%    Neurologically intact ABD soft Neurovascular intact Sensation intact distally Intact pulses distally Dorsiflexion/Plantar flexion intact Incision: right leg wound vac draining serosanguinous fluid, incisions dressed and covered with an ace wrap Left arm remains in a short arm splint  Lab Results  Component Value Date   WBC 9.7 11/17/2014   HGB 15.6 11/17/2014   HCT 43.0 11/17/2014   MCV 87.9 11/17/2014   PLT 265 11/17/2014   BMET    Component Value Date/Time   NA 134* 11/20/2014 0615   K 3.4* 11/20/2014 0615   CL 98 11/20/2014 0615   CO2 26 11/20/2014 0615   GLUCOSE 277* 11/20/2014 0615   BUN 16 11/20/2014 0615   CREATININE 0.98 11/20/2014 0615   CALCIUM 7.9* 11/20/2014 0615   GFRNONAA >90 11/20/2014 0615   GFRAA >90 11/20/2014 0615     Assessment/Plan: 1 Day Post-Op   Principal Problem:   Suicide attempt Active Problems:   Compartment syndrome, traumatic, lower extremity   Open right tibial fracture   Penetrating foreign body of right knee   Right tibial fracture   Up with therapy Continue foley due to acute urinary retention- will plan to keep foley till post-op Friday.  Will start flomax today to help with urinary retention Ok to weight bear through the left elbow, no WB on the right leg The goal is to d/c to rehab to improve strength and mobility    [name] [name] 11/27/2014, 8:57 AM   Janalee DaneBrittney Tyrhonda Georgiades PA-C [phone]

## 2014-11-28 ENCOUNTER — Encounter (HOSPITAL_COMMUNITY): Payer: Self-pay | Admitting: Orthopedic Surgery

## 2014-11-28 ENCOUNTER — Encounter (HOSPITAL_COMMUNITY)
Admission: EM | Disposition: A | Discharge status: Rehabilitation Facility | Payer: Self-pay | Source: Home / Self Care | Attending: Orthopedic Surgery

## 2014-11-28 ENCOUNTER — Inpatient Hospital Stay (HOSPITAL_COMMUNITY): Payer: Federal, State, Local not specified - PPO | Admitting: Anesthesiology

## 2014-11-28 DIAGNOSIS — T79A21A Traumatic compartment syndrome of right lower extremity, initial encounter: Principal | ICD-10-CM

## 2014-11-28 DIAGNOSIS — E1165 Type 2 diabetes mellitus with hyperglycemia: Secondary | ICD-10-CM

## 2014-11-28 DIAGNOSIS — S52509A Unspecified fracture of the lower end of unspecified radius, initial encounter for closed fracture: Secondary | ICD-10-CM | POA: Insufficient documentation

## 2014-11-28 DIAGNOSIS — S52502A Unspecified fracture of the lower end of left radius, initial encounter for closed fracture: Secondary | ICD-10-CM

## 2014-11-28 HISTORY — PX: I & D EXTREMITY: SHX5045

## 2014-11-28 LAB — GLUCOSE, CAPILLARY
GLUCOSE-CAPILLARY: 183 mg/dL — AB (ref 70–99)
Glucose-Capillary: 258 mg/dL — ABNORMAL HIGH (ref 70–99)
Glucose-Capillary: 171 mg/dL — ABNORMAL HIGH (ref 70–99)
Glucose-Capillary: 160 mg/dL — ABNORMAL HIGH (ref 70–99)
Glucose-Capillary: 158 mg/dL — ABNORMAL HIGH (ref 70–99)
Glucose-Capillary: 270 mg/dL — ABNORMAL HIGH (ref 70–99)

## 2014-11-28 LAB — BASIC METABOLIC PANEL
ANION GAP: 5 (ref 5–15)
BUN: 12 mg/dL (ref 6–23)
CALCIUM: 8.8 mg/dL (ref 8.4–10.5)
CO2: 30 mmol/L (ref 19–32)
CREATININE: 0.76 mg/dL (ref 0.50–1.35)
Chloride: 104 mmol/L (ref 96–112)
GFR calc non Af Amer: >90 mL/min (ref >90)
Glucose, Bld: 241 mg/dL — ABNORMAL HIGH (ref 70–99)
Potassium: 4 mmol/L (ref 3.5–5.1)
Sodium: 139 mmol/L (ref 135–145)

## 2014-11-28 LAB — HEMOGLOBIN A1C
Hgb A1c MFr Bld: 7.4 % — ABNORMAL HIGH (ref 4.8–5.6)
Mean Plasma Glucose: 166 mg/dL

## 2014-11-28 SURGERY — IRRIGATION AND DEBRIDEMENT EXTREMITY
Anesthesia: General | Site: Leg Lower | Laterality: Right

## 2014-11-28 MED ORDER — CEFAZOLIN SODIUM-DEXTROSE 2-3 GM-% IV SOLR
INTRAVENOUS | Status: DC | PRN
  Administered 2014-11-28: 2 g via INTRAVENOUS

## 2014-11-28 MED ORDER — MIDAZOLAM HCL 5 MG/5ML IJ SOLN
INTRAMUSCULAR | Status: DC | PRN
  Administered 2014-11-28: 2 mg via INTRAVENOUS

## 2014-11-28 MED ORDER — INSULIN GLARGINE 100 UNIT/ML ~~LOC~~ SOLN
10.0000 [IU] | Freq: Every day | SUBCUTANEOUS | Status: DC
  Administered 2014-11-28 – 2014-11-29 (×2): 10 [IU] via SUBCUTANEOUS
  Filled 2014-11-28 (×2): qty 0.1

## 2014-11-28 MED ORDER — SUCCINYLCHOLINE CHLORIDE 20 MG/ML IJ SOLN
INTRAMUSCULAR | Status: DC | PRN
  Administered 2014-11-28: 140 mg via INTRAVENOUS

## 2014-11-28 MED ORDER — HYDROMORPHONE HCL 1 MG/ML IJ SOLN
INTRAMUSCULAR | Status: AC
  Filled 2014-11-28: qty 1

## 2014-11-28 MED ORDER — PROPOFOL 10 MG/ML IV BOLUS
INTRAVENOUS | Status: DC | PRN
  Administered 2014-11-28: 200 mg via INTRAVENOUS

## 2014-11-28 MED ORDER — HYDROMORPHONE HCL 1 MG/ML IJ SOLN
0.2500 mg | INTRAMUSCULAR | Status: DC | PRN
  Administered 2014-11-28 (×4): 0.5 mg via INTRAVENOUS

## 2014-11-28 MED ORDER — ONDANSETRON HCL 4 MG/2ML IJ SOLN
INTRAMUSCULAR | Status: DC | PRN
  Administered 2014-11-28: 4 mg via INTRAVENOUS

## 2014-11-28 MED ORDER — DEXTROSE-NACL 5-0.45 % IV SOLN
INTRAVENOUS | Status: DC
  Administered 2014-11-28: 21:00:00 via INTRAVENOUS

## 2014-11-28 MED ORDER — CEFAZOLIN SODIUM-DEXTROSE 2-3 GM-% IV SOLR
2.0000 g | Freq: Four times a day (QID) | INTRAVENOUS | Status: AC
  Administered 2014-11-28 – 2014-11-29 (×3): 2 g via INTRAVENOUS
  Filled 2014-11-28 (×3): qty 50

## 2014-11-28 MED ORDER — FENTANYL CITRATE 0.05 MG/ML IJ SOLN
INTRAMUSCULAR | Status: AC
  Filled 2014-11-28: qty 5

## 2014-11-28 MED ORDER — LIDOCAINE HCL (CARDIAC) 20 MG/ML IV SOLN
INTRAVENOUS | Status: DC | PRN
  Administered 2014-11-28: 100 mg via INTRAVENOUS

## 2014-11-28 MED ORDER — OXYCODONE HCL 5 MG/5ML PO SOLN: 5.0000 mg | Freq: Once | ORAL | Status: DC | PRN

## 2014-11-28 MED ORDER — ONDANSETRON HCL 4 MG/2ML IJ SOLN: 4.0000 mg | Freq: Once | INTRAMUSCULAR | Status: DC | PRN

## 2014-11-28 MED ORDER — OXYCODONE HCL 5 MG PO TABS: 5.0000 mg | ORAL_TABLET | Freq: Once | ORAL | Status: DC | PRN

## 2014-11-28 MED ORDER — LACTATED RINGERS IV SOLN
INTRAVENOUS | Status: DC | PRN
  Administered 2014-11-28: 15:00:00 via INTRAVENOUS

## 2014-11-28 MED ORDER — FENTANYL CITRATE 0.05 MG/ML IJ SOLN
INTRAMUSCULAR | Status: DC | PRN
  Administered 2014-11-28: 100 ug via INTRAVENOUS
  Administered 2014-11-28 (×3): 50 ug via INTRAVENOUS

## 2014-11-28 MED ORDER — MIDAZOLAM HCL 2 MG/2ML IJ SOLN
INTRAMUSCULAR | Status: AC
  Filled 2014-11-28: qty 2

## 2014-11-28 MED ORDER — PHENYLEPHRINE HCL 10 MG/ML IJ SOLN
INTRAMUSCULAR | Status: DC | PRN
  Administered 2014-11-28: 80 ug via INTRAVENOUS
  Administered 2014-11-28: 120 ug via INTRAVENOUS
  Administered 2014-11-28: 80 ug via INTRAVENOUS
  Administered 2014-11-28: 120 ug via INTRAVENOUS

## 2014-11-28 MED ORDER — EPHEDRINE SULFATE 50 MG/ML IJ SOLN
INTRAMUSCULAR | Status: DC | PRN
  Administered 2014-11-28: 5 mg via INTRAVENOUS

## 2014-11-28 SURGICAL SUPPLY — 43 items
BANDAGE ELASTIC 4 VELCRO ST LF (GAUZE/BANDAGES/DRESSINGS) ×2 IMPLANT
BANDAGE ELASTIC 6 VELCRO ST LF (GAUZE/BANDAGES/DRESSINGS) ×2 IMPLANT
BLADE SURG 10 STRL SS (BLADE) ×2 IMPLANT
BNDG COHESIVE 4X5 TAN STRL (GAUZE/BANDAGES/DRESSINGS) ×2 IMPLANT
BNDG GAUZE ELAST 4 BULKY (GAUZE/BANDAGES/DRESSINGS) ×2 IMPLANT
BOOTCOVER CLEANROOM LRG (PROTECTIVE WEAR) ×4 IMPLANT
COVER SURGICAL LIGHT HANDLE (MISCELLANEOUS) ×2 IMPLANT
CUFF TOURNIQUET SINGLE 34IN LL (TOURNIQUET CUFF) IMPLANT
DRSG ADAPTIC 3X8 NADH LF (GAUZE/BANDAGES/DRESSINGS) ×6 IMPLANT
DRSG PAD ABDOMINAL 8X10 ST (GAUZE/BANDAGES/DRESSINGS) ×4 IMPLANT
DURAPREP 26ML APPLICATOR (WOUND CARE) ×2 IMPLANT
ELECT REM PT RETURN 9FT ADLT (ELECTROSURGICAL)
ELECTRODE REM PT RTRN 9FT ADLT (ELECTROSURGICAL) IMPLANT
EVACUATOR 1/8 PVC DRAIN (DRAIN) IMPLANT
FACESHIELD WRAPAROUND (MASK) ×6 IMPLANT
GAUZE SPONGE 4X4 12PLY STRL (GAUZE/BANDAGES/DRESSINGS) ×2 IMPLANT
GAUZE XEROFORM 1X8 LF (GAUZE/BANDAGES/DRESSINGS) ×2 IMPLANT
GLOVE BIO SURGEON STRL SZ7.5 (GLOVE) ×4 IMPLANT
GLOVE BIO SURGEON STRL SZ8 (GLOVE) ×2 IMPLANT
GOWN STRL REUS W/ TWL LRG LVL3 (GOWN DISPOSABLE) ×3 IMPLANT
GOWN STRL REUS W/TWL 2XL LVL3 (GOWN DISPOSABLE) ×2 IMPLANT
GOWN STRL REUS W/TWL LRG LVL3 (GOWN DISPOSABLE) ×3
HANDPIECE INTERPULSE COAX TIP (DISPOSABLE)
KIT BASIN OR (CUSTOM PROCEDURE TRAY) ×2 IMPLANT
KIT ROOM TURNOVER OR (KITS) ×2 IMPLANT
MANIFOLD NEPTUNE II (INSTRUMENTS) ×2 IMPLANT
NS IRRIG 1000ML POUR BTL (IV SOLUTION) ×2 IMPLANT
PACK ORTHO EXTREMITY (CUSTOM PROCEDURE TRAY) ×2 IMPLANT
PAD ARMBOARD 7.5X6 YLW CONV (MISCELLANEOUS) ×4 IMPLANT
PENCIL BUTTON HOLSTER BLD 10FT (ELECTRODE) IMPLANT
SET HNDPC FAN SPRY TIP SCT (DISPOSABLE) IMPLANT
SPONGE GAUZE 4X4 12PLY STER LF (GAUZE/BANDAGES/DRESSINGS) ×4 IMPLANT
SPONGE LAP 18X18 X RAY DECT (DISPOSABLE) ×2 IMPLANT
STOCKINETTE IMPERVIOUS 9X36 MD (GAUZE/BANDAGES/DRESSINGS) ×2 IMPLANT
SUT ETHILON 3 0 PS 1 (SUTURE) ×8 IMPLANT
TOWEL OR 17X24 6PK STRL BLUE (TOWEL DISPOSABLE) ×2 IMPLANT
TOWEL OR 17X26 10 PK STRL BLUE (TOWEL DISPOSABLE) ×2 IMPLANT
TOWEL OR NON WOVEN STRL DISP B (DISPOSABLE) ×2 IMPLANT
TUBE ANAEROBIC SPECIMEN COL (MISCELLANEOUS) IMPLANT
TUBE CONNECTING 12X1/4 (SUCTIONS) ×2 IMPLANT
UNDERPAD 30X30 INCONTINENT (UNDERPADS AND DIAPERS) ×2 IMPLANT
WATER STERILE IRR 1000ML POUR (IV SOLUTION) ×2 IMPLANT
YANKAUER SUCT BULB TIP NO VENT (SUCTIONS) ×2 IMPLANT

## 2014-11-28 NOTE — Progress Notes (Signed)
     Subjective:  Patient reports pain as moderate.  He has had no difficultly urinating since the foley was removed yesterday.  The patient was started on flomax yesterday.  Medicine was consulted yesterday to help get better control of blood sugars. Patient has been NPO since midnight and is scheduled for wound closure today.   Objective:   VITALS:   Filed Vitals:   11/27/14 0549 11/27/14 1300 11/27/14 2112 11/28/14 0612  BP: 148/72 150/76 131/63 122/70  Pulse: 96 91 112 90  Temp: 98.6 F (37 C) 98.1 F (36.7 C) 99 F (37.2 C) 98.3 F (36.8 C)  TempSrc:   Oral Oral  Resp: 16 16 16 14   SpO2: 97% 98% 95% 99%    Neurologically intact ABD soft Neurovascular intact Sensation intact distally Intact pulses distally Incision: wound draining into a wound vac   Lab Results  Component Value Date   WBC 9.7 11/17/2014   HGB 15.6 11/17/2014   HCT 43.0 11/17/2014   MCV 87.9 11/17/2014   PLT 265 11/17/2014   BMET    Component Value Date/Time   NA 134* 11/20/2014 0615   K 3.4* 11/20/2014 0615   CL 98 11/20/2014 0615   CO2 26 11/20/2014 0615   GLUCOSE 277* 11/20/2014 0615   BUN 16 11/20/2014 0615   CREATININE 0.98 11/20/2014 0615   CALCIUM 7.9* 11/20/2014 0615   GFRNONAA >90 11/20/2014 0615   GFRAA >90 11/20/2014 0615     Assessment/Plan: 2 Days Post-Op   Active Problems:   Compartment syndrome, traumatic, lower extremity   Open right tibial fracture   Penetrating foreign body of right knee   Right tibial fracture   Suicide attempt   Diabetes mellitus type 2, uncontrolled   Essential hypertension   Up with therapy Plan to take the patient back to the OR today for wound closure Medicine consulted to help get better control of blood sugars, A1C is pending Flomax will be continued to help with voiding   Joden Bonsall Hilda LiasMarie 11/28/2014, 8:25 AM

## 2014-11-28 NOTE — Progress Notes (Signed)
OT Cancellation Note  Patient Details Name: Retta Dionesnthony Tonelli MRN: [id number] DOB: 01/10/1971   Cancelled Treatment:    Reason Eval/Treat Not Completed: Patient declined, no reason specified. Per PT, patient refused to get OOB again today, just doing LE exercises from bed level with PT. Will re-attempt as able.   Arush Gatliff , MS, OTR/L, CLT Pager: [phone]  11/28/2014, 11:53 AM

## 2014-11-28 NOTE — Op Note (Signed)
11/17/2014 - 11/28/2014  4:12 PM  PATIENT:  [name] [name]    PRE-OPERATIVE DIAGNOSIS:  RIGHT LEG FASCIOTOMIES  POST-OPERATIVE DIAGNOSIS:  Same  PROCEDURE:  IRRIGATION AND DEBRIDEMENT WITH WOUND CLOSURE OF RIGHT LEG FASCIOTOMIES  SURGEON:  Sheral ApleyMURPHY, Silvana Holecek, D, MD  ASSISTANT: Janalee DaneBrittney Kelly, PA-C, She was present and scrubbed throughout the case, critical for completion in a timely fashion, and for retraction, instrumentation, and closure.   ANESTHESIA:   gen  PREOPERATIVE INDICATIONS:  [name] [name] is a  44 y.o. male with a diagnosis of RIGHT LEG FASCIOTOMIES who failed conservative measures and elected for surgical management.    The risks benefits and alternatives were discussed with the patient preoperatively including but not limited to the risks of infection, bleeding, nerve injury, cardiopulmonary complications, the need for revision surgery, among others, and the patient was willing to proceed.  OPERATIVE IMPLANTS: none  OPERATIVE FINDINGS: wound closed  BLOOD LOSS: min  COMPLICATIONS: none  TOURNIQUET TIME: none  OPERATIVE PROCEDURE:  Patient was identified in the preoperative holding area and site was marked by me He was transported to the operating theater and placed on the table in supine position taking care to pad all bony prominences. After a preincinduction time out anesthesia was induced. The left lower extremity was prepped and draped in normal sterile fashion and a pre-incision timeout was performed. He received ancef for preoperative antibiotics.   I thoroughly irrigated his medial and lateral fascial incisions I completed the closure on the medial side with a few simple stitches. As very happy with the approximation here was no tension on the skin.  Next I turned my attention to his lateral fascial incision that was more open from his previous surgery. The retention stitches could easily be slacked.  I placed a few additional retention stitches and was happy  with the reapproximation of the skin I placed a few simple stitches and with minimal tension was able able to bring the skin edges together for this complex closure.  Wounds measured 15 cm each.  I then placed sterile dressing he was awoken and taken the PACU in stable condition.  POST OPERATIVE PLAN: NWB RLE. Lovenox for DVT px    This note was generated using a template and dragon dictation system. In light of that, I have reviewed the note and all aspects of it are applicable to this case. Any dictation errors are due to the computerized dictation system.

## 2014-11-28 NOTE — Anesthesia Postprocedure Evaluation (Signed)
  Anesthesia Post-op Note  Patient: [name] [name]  Procedure(s) Performed: Procedure(s): IRRIGATION AND DEBRIDEMENT WITH WOUND CLOSURE OF RIGHT LEG FASCIOTOMIES (Right)  Patient Location: PACU  Anesthesia Type:General  Level of Consciousness: awake and alert   Airway and Oxygen Therapy: Patient Spontanous Breathing  Post-op Pain: none  Post-op Assessment: Post-op Vital signs reviewed  Post-op Vital Signs: Reviewed  Last Vitals:  Filed Vitals:   11/28/14 1810  BP: 134/68  Pulse: 95  Temp: 36.9 C  Resp: 18    Complications: No apparent anesthesia complications

## 2014-11-28 NOTE — Transfer of Care (Signed)
Immediate Anesthesia Transfer of Care Note  Patient: [name] [name]  Procedure(s) Performed: Procedure(s): IRRIGATION AND DEBRIDEMENT WITH WOUND CLOSURE OF RIGHT LEG FASCIOTOMIES (Right)  Patient Location: PACU  Anesthesia Type:General  Level of Consciousness: awake, alert , oriented and patient cooperative  Airway & Oxygen Therapy: Patient Spontanous Breathing and Patient connected to nasal cannula oxygen  Post-op Assessment: Report given to RN, Post -op Vital signs reviewed and stable, Patient moving all extremities and Patient moving all extremities X 4  Post vital signs: Reviewed and stable  Last Vitals:  Filed Vitals:   11/28/14 0612  BP: 122/70  Pulse: 90  Temp: 36.8 C  Resp: 14    Complications: No apparent anesthesia complications

## 2014-11-28 NOTE — H&P (View-Only) (Signed)
    Subjective:  Patient reports pain as well controlled. He is out of bed and resting comfortably in a chair this morning.  He is concerned that he has not been able to have a BM yesterday or this morning.  His nurse has given him 2 suppositories to try to help.  He still has the foley catheter in to help with voiding.  Objective:   VITALS:   Filed Vitals:   11/23/14 0953 11/23/14 1343 11/23/14 1949 11/24/14 0417  BP: 141/76 132/72 137/85 134/63  Pulse: 102 101 95 101  Temp:  98.3 F (36.8 C) 98.3 F (36.8 C) 100.1 F (37.8 C)  TempSrc:  Oral Oral Oral  Resp:  18 18 18   SpO2:  98% 100% 100%    Physical Exam Abdomen is soft and nontender Foley catheter in place and draining urine  Dressing: C/D/I  Compartments soft  SILT M/R/U, 2+rad puls, +EPL/FPL/IO  SILT DP/SP/S/S/T, 2+DP, +TA/GS/EHL  LABS  Results for orders placed or performed during the hospital encounter of 11/17/14 (from the past 24 hour(s))  Glucose, capillary     Status: Abnormal   Collection Time: 11/23/14  8:08 AM  Result Value Ref Range   Glucose-Capillary 222 (H) 70 - 99 mg/dL  Urinalysis, Routine w reflex microscopic     Status: Abnormal   Collection Time: 11/23/14  9:49 AM  Result Value Ref Range   Color, Urine YELLOW YELLOW   APPearance CLEAR CLEAR   Specific Gravity, Urine 1.012 1.005 - 1.030   pH 6.5 5.0 - 8.0   Glucose, UA 250 (A) NEGATIVE mg/dL   Hgb urine dipstick NEGATIVE NEGATIVE   Bilirubin Urine NEGATIVE NEGATIVE   Ketones, ur NEGATIVE NEGATIVE mg/dL   Protein, ur NEGATIVE NEGATIVE mg/dL   Urobilinogen, UA 0.2 0.0 - 1.0 mg/dL   Nitrite NEGATIVE NEGATIVE   Leukocytes, UA NEGATIVE NEGATIVE  Glucose, capillary     Status: Abnormal   Collection Time: 11/23/14 12:30 PM  Result Value Ref Range   Glucose-Capillary 226 (H) 70 - 99 mg/dL  Glucose, capillary     Status: Abnormal   Collection Time: 11/23/14  5:23 PM  Result Value Ref Range   Glucose-Capillary 209 (H) 70 - 99 mg/dL  Glucose,  capillary     Status: Abnormal   Collection Time: 11/23/14  9:50 PM  Result Value Ref Range   Glucose-Capillary 191 (H) 70 - 99 mg/dL  Glucose, capillary     Status: Abnormal   Collection Time: 11/24/14  5:54 AM  Result Value Ref Range   Glucose-Capillary 254 (H) 70 - 99 mg/dL     Assessment/Plan: Principal Problem:   Suicide attempt Active Problems:   Compartment syndrome, traumatic, lower extremity   Open right tibial fracture   Penetrating foreign body of right knee   Right tibial fracture   PLAN: Weight Bearing: NWB RLE Encouraged the patient to try to transition to oral pain medication with IV dilaudid more for break through pain.  He is agreeable to trying this transition.  Will pull the foley catheter today.  If the patient continues to have difficulty with urination will need to consult urology.  Will place an order for an enema to help the patient move his bowels if the suppositories don't help today.    Margarita RanaMURPHY, Maryanne Huneycutt, D 11/24/2014, 7:41 AM   Margarita Ranaimothy Alder Murri, MD Cell [phone]

## 2014-11-28 NOTE — Progress Notes (Addendum)
I met with pt at bedside to discuss a possible inpt rehab admission pending [employer] approval. I spoke with his wife by phone and she intends for pt to return home with her at d/c. His 46 yo stepson can provide 24/7 supervision at d/c as his wife works. Noted pt for surgery today. I will follow up tomorrow with PT and OT postoperatively to pursue inpt rehab admission.  I have notified present SW that pt is [employer] primary, not Medicare. I also emphasized with pt that he needs to fully participate in therapy to consider intense inpt rehab admission . [phone]
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
## 2014-11-28 NOTE — Interval H&P Note (Signed)
History and Physical Interval Note:  11/28/2014 11:20 AM  [name] [name]  has presented today for surgery, with the diagnosis of RIGHT LEG FASCIOTOMIES  The various methods of treatment have been discussed with the patient and family. After consideration of risks, benefits and other options for treatment, the patient has consented to  Procedure(s): IRRIGATION AND DEBRIDEMENT WITH WOUND CLOSURE OF RIGHT LEG FASCIOTOMIES (Right) as a surgical intervention .  The patient's history has been reviewed, patient examined, no change in status, stable for surgery.  I have reviewed the patient's chart and labs.  Questions were answered to the patient's satisfaction.     [name], D

## 2014-11-28 NOTE — Progress Notes (Addendum)
Patient is a consult for blood sugar management.  Headed back to surgery today.  HgbA1C: 7.4. Currently on SSI, metformin and I added lantus for control while healing. Fluids containing D5 have been d/c'd Marlin CanaryJessica Vann

## 2014-11-28 NOTE — Interval H&P Note (Signed)
History and Physical Interval Note:  11/28/2014 11:19 AM  Retta DionesAnthony Zenz  has presented today for surgery, with the diagnosis of RIGHT LEG FASCIOTOMIES  The various methods of treatment have been discussed with the patient and family. After consideration of risks, benefits and other options for treatment, the patient has consented to  Procedure(s): IRRIGATION AND DEBRIDEMENT WITH WOUND CLOSURE OF RIGHT LEG FASCIOTOMIES (Right) as a surgical intervention .  The patient's history has been reviewed, patient examined, no change in status, stable for surgery.  I have reviewed the patient's chart and labs.  Questions were answered to the patient's satisfaction.     Dawnyel Leven, D

## 2014-11-28 NOTE — Anesthesia Procedure Notes (Signed)
Procedure Name: Intubation Date/Time: 11/28/2014 3:36 PM Performed by: Edmonia CaprioAUSTON, Kaitlyn Franko M Pre-anesthesia Checklist: Patient identified, Timeout performed, Suction available, Patient being monitored and Emergency Drugs available Patient Re-evaluated:Patient Re-evaluated prior to inductionOxygen Delivery Method: Circle system utilized Preoxygenation: Pre-oxygenation with 100% oxygen Intubation Type: IV induction Ventilation: Mask ventilation without difficulty Laryngoscope Size: Miller and 2 Grade View: Grade III Tube type: Oral Number of attempts: 1 Airway Equipment and Method: Stylet Placement Confirmation: ETT inserted through vocal cords under direct vision,  breath sounds checked- equal and bilateral and positive ETCO2 Secured at: 22 cm Tube secured with: Tape Dental Injury: Teeth and Oropharynx as per pre-operative assessment
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
## 2014-11-29 LAB — GLUCOSE, CAPILLARY
Glucose-Capillary: 212 mg/dL — ABNORMAL HIGH (ref 70–99)
Glucose-Capillary: 236 mg/dL — ABNORMAL HIGH (ref 70–99)
Glucose-Capillary: 222 mg/dL — ABNORMAL HIGH (ref 70–99)
Glucose-Capillary: 245 mg/dL — ABNORMAL HIGH (ref 70–99)

## 2014-11-29 MED ORDER — OXYCODONE-ACETAMINOPHEN 10-325 MG PO TABS: 1.0000 | ORAL_TABLET | ORAL | Status: DC | PRN

## 2014-11-29 MED ORDER — INSULIN GLARGINE 100 UNIT/ML ~~LOC~~ SOLN
15.0000 [IU] | Freq: Every day | SUBCUTANEOUS | Status: DC
  Administered 2014-11-30: 15 [IU] via SUBCUTANEOUS
  Filled 2014-11-29 (×2): qty 0.15

## 2014-11-29 MED ORDER — ENOXAPARIN SODIUM 40 MG/0.4ML ~~LOC~~ SOLN: 40.0000 mg | SUBCUTANEOUS | Status: DC

## 2014-11-29 MED ORDER — DOCUSATE SODIUM 100 MG PO CAPS: 100.0000 mg | ORAL_CAPSULE | Freq: Every day | ORAL | Status: DC | PRN

## 2014-11-29 MED ORDER — METHOCARBAMOL 500 MG PO TABS: 500.0000 mg | ORAL_TABLET | Freq: Four times a day (QID) | ORAL | Status: DC

## 2014-11-29 NOTE — Progress Notes (Signed)
     Subjective:  Patient reports pain as moderate.  Patient was up in a wheel chair today.    Objective:   VITALS:   Filed Vitals:   11/28/14 1940 11/29/14 0147 11/29/14 0433 11/29/14 1420  BP: 119/60 124/64 115/78 146/83  Pulse: 109 98 102 99  Temp: 98.2 F (36.8 C) 98.3 F (36.8 C) 98.4 F (36.9 C) 98.1 F (36.7 C)  TempSrc:      Resp: 18 18 18 16   SpO2: 97% 98% 98% 96%    Neurologically intact ABD soft Neurovascular intact Sensation intact distally Intact pulses distally Incision: dressing C/D/I   Lab Results  Component Value Date   WBC 9.7 11/17/2014   HGB 15.6 11/17/2014   HCT 43.0 11/17/2014   MCV 87.9 11/17/2014   PLT 265 11/17/2014   BMET    Component Value Date/Time   NA 139 11/28/2014 0644   K 4.0 11/28/2014 0644   CL 104 11/28/2014 0644   CO2 30 11/28/2014 0644   GLUCOSE 241* 11/28/2014 0644   BUN 12 11/28/2014 0644   CREATININE 0.76 11/28/2014 0644   CALCIUM 8.8 11/28/2014 0644   GFRNONAA >90 11/28/2014 0644   GFRAA >90 11/28/2014 0644     Assessment/Plan: 1 Day Post-Op   Active Problems:   Compartment syndrome, traumatic, lower extremity   Open right tibial fracture   Penetrating foreign body of right knee   Right tibial fracture   Suicide attempt   Diabetes mellitus type 2, uncontrolled   Essential hypertension   Distal radius fracture   Up with therapy  NWB in the RLE and L wrist, WBAT in left elbow Plan to discharge to rehab when bed is available Hospitalists are managing DM Continue Lovenox for DVT prophylaxis     Zekiah Coen Marie 11/29/2014, 4:55 PM   Janalee DaneBrittney Kinte Trim PA-C [phone]

## 2014-11-29 NOTE — Consult Note (Signed)
PROGRESS NOTE  [name] [name] JYN:[id number] DOB: 09/04/1971 DOA: 11/17/2014 PCP: Sherrie MustacheJADALI,FAYEGH, MD  Assessment/Plan: DM- titrate lantus, continue SSI Monitor blood sugars -metformin  Code Status: full Family Communication:  Disposition Plan:    Consultants:    Procedures:     HPI/Subjective: No overnight issues s/p surgery  Objective: Filed Vitals:   11/29/14 0433  BP: 115/78  Pulse: 102  Temp: 98.4 F (36.9 C)  Resp: 18    Intake/Output Summary (Last 24 hours) at 11/29/14 1109 Last data filed at 11/29/14 0400  Gross per 24 hour  Intake 2183.33 ml  Output   2100 ml  Net  83.33 ml   There were no vitals filed for this visit.  Exam:   General:  pleasant  Cardiovascular:rrr    Data Reviewed: Basic Metabolic Panel:  Recent Labs Lab 11/28/14 0644  NA 139  K 4.0  CL 104  CO2 30  GLUCOSE 241*  BUN 12  CREATININE 0.76  CALCIUM 8.8   Liver Function Tests: No results for input(s): AST, ALT, ALKPHOS, BILITOT, PROT, ALBUMIN in the last 168 hours. No results for input(s): LIPASE, AMYLASE in the last 168 hours. No results for input(s): AMMONIA in the last 168 hours. CBC: No results for input(s): WBC, NEUTROABS, HGB, HCT, MCV, PLT in the last 168 hours. Cardiac Enzymes: No results for input(s): CKTOTAL, CKMB, CKMBINDEX, TROPONINI in the last 168 hours. BNP (last 3 results) No results for input(s): BNP in the last 8760 hours.  ProBNP (last 3 results) No results for input(s): PROBNP in the last 8760 hours.  CBG:  Recent Labs Lab 11/28/14 1329 11/28/14 1640 11/28/14 1837 11/28/14 2113 11/29/14 0622  GLUCAP 160* 158* 183* 270* 212*    No results found for this or any previous visit (from the past 240 hour(s)).   Studies: No results found.  Scheduled Meds: .  ceFAZolin (ANCEF) IV  2 g Intravenous Q6H  . docusate sodium  100 mg Oral BID  . enoxaparin (LOVENOX) injection  40 mg Subcutaneous Q24H  . furosemide  40 mg Oral Daily  .  insulin aspart  0-15 Units Subcutaneous TID WC  . insulin aspart  0-5 Units Subcutaneous QHS  . [START ON 11/30/2014] insulin glargine  15 Units Subcutaneous Daily  . lisinopril  40 mg Oral Daily  . metFORMIN  1,000 mg Oral Q breakfast  . pantoprazole  40 mg Oral Daily  . senna  1 tablet Oral BID  . tamsulosin  0.4 mg Oral Daily   Continuous Infusions: . dextrose 5 % and 0.45% NaCl 100 mL/hr at 11/28/14 2034  . lactated ringers 50 mL/hr at 11/21/14 1341  . lactated ringers 10 mL/hr at 11/28/14 1351   Antibiotics Given (last 72 hours)    Date/Time Action Medication Dose Rate   11/26/14 1953 Given   ceFAZolin (ANCEF) IVPB 2 g/50 mL premix 2 g 100 mL/hr   11/27/14 0106 Given   ceFAZolin (ANCEF) IVPB 2 g/50 mL premix 2 g 100 mL/hr   11/27/14 0631 Given   ceFAZolin (ANCEF) IVPB 2 g/50 mL premix 2 g 100 mL/hr   11/28/14 2027 Given   ceFAZolin (ANCEF) IVPB 2 g/50 mL premix 2 g 100 mL/hr   11/29/14 0219 Given   ceFAZolin (ANCEF) IVPB 2 g/50 mL premix 2 g 100 mL/hr      Active Problems:   Compartment syndrome, traumatic, lower extremity   Open right tibial fracture   Penetrating foreign body of right knee   Right tibial fracture  Suicide attempt   Diabetes mellitus type 2, uncontrolled   Essential hypertension   Distal radius fracture    Time spent: 25 min    Benjamine Mola JESSICA  [facility] Pager [phone] If 7PM-7AM, please contact night-coverage at [email or web address], password [facility] 11/29/2014, 11:09 AM  LOS: 11 days

## 2014-11-29 NOTE — Progress Notes (Signed)
We are pursuing [employer] approval to admit pt to inpt rehab now that pt is participating with therapy. [facility] I await their decision to determine if we can admit pt to inpt rehab or if SNF will have to be pursued. [phone]

## 2014-11-29 NOTE — Progress Notes (Signed)
Inpatient Diabetes Program Recommendations  AACE/ADA: New Consensus Statement on Inpatient Glycemic Control (2013)  Target Ranges:  Prepandial:   less than 140 mg/dL      Peak postprandial:   less than 180 mg/dL (1-2 hours)      Critically ill patients:  140 - 180 mg/dL    Inpatient Diabetes Program Recommendations Insulin - Basal: Pt given lantus 10 units yesterday-glucose in 200's again this am. Please increase to 15 units and continue to increase until fasting glucose is 140-180 mg/dL Correction (SSI): Please considering Novolog 0-5 units QHS for bedtime coverage. Insulin - Meal Coverage: Please also consider adding low dose novolog meal coverage of 3 units tidwc. HgbA1C: Please consider ordering an A1c to assess blood glucose over the past 2-3 months.  Thank you Lenor CoffinAnn Isaiyah Feldhaus, RN, MSN, CDE  Diabetes Inpatient Program Office: [phone] Pager: [phone] 8:00 am to 5:00 pm
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
## 2014-11-29 NOTE — Discharge Instructions (Signed)
Keep dressing clean and dry till follow up No weight bearing in the right leg and left arm, weight bear as tolerated through the elbow Continue Lovenox for 30 days for DVT proph.

## 2014-11-30 ENCOUNTER — Encounter (HOSPITAL_COMMUNITY): Payer: Self-pay | Admitting: Orthopedic Surgery

## 2014-11-30 LAB — GLUCOSE, CAPILLARY
GLUCOSE-CAPILLARY: 215 mg/dL — AB (ref 70–99)
GLUCOSE-CAPILLARY: 242 mg/dL — AB (ref 70–99)
GLUCOSE-CAPILLARY: 316 mg/dL — AB (ref 70–99)
GLUCOSE-CAPILLARY: 246 mg/dL — AB (ref 70–99)
Glucose-Capillary: 322 mg/dL — ABNORMAL HIGH (ref 70–99)

## 2014-11-30 MED ORDER — INSULIN ASPART 100 UNIT/ML ~~LOC~~ SOLN
5.0000 [IU] | Freq: Three times a day (TID) | SUBCUTANEOUS | Status: DC
  Administered 2014-11-30 – 2014-12-01 (×3): 5 [IU] via SUBCUTANEOUS

## 2014-11-30 NOTE — Progress Notes (Signed)
SPORTS MEDICINE AND JOINT REPLACEMENT  Georgena SpurlingStephen Lucey, MD   Altamese CabalMaurice Syaire Saber, PA-C [address], [place], [place]  [address]                             [phone]   PROGRESS NOTE  Subjective:  negative for Chest Pain  negative for Shortness of Breath  negative for Nausea/Vomiting   negative for Calf Pain  negative for Bowel Movement   Tolerating Diet: yes         Patient reports pain as 5 on 0-10 scale.    Objective: Vital signs in last 24 hours:   Patient Vitals for the past 24 hrs:  BP Temp Pulse Resp SpO2  11/30/14 0557 138/78 mmHg 98.2 F (36.8 C) 98 16 96 %  11/29/14 1942 136/73 mmHg 98.2 F (36.8 C) 96 16 96 %  11/29/14 1420 (!) 146/83 mmHg 98.1 F (36.7 C) 99 16 96 %    @flow {1959:LAST@   Intake/Output from previous day:   02/19 0701 - 02/20 0700 In: 970 [P.O.:970] Out: 1000 [Urine:1000]   Intake/Output this shift:   02/20 0701 - 02/20 1900 In: 240 [P.O.:240] Out: -    Intake/Output      02/19 0701 - 02/20 0700 02/20 0701 - 02/21 0700   P.O. 970 240   I.V.     IV Piggyback     Total Intake 970 240   Urine 1000    Total Output 1000     Net -30 +240        Urine Occurrence 4 x       LABORATORY DATA: No results for input(s): WBC, HGB, HCT, PLT in the last 168 hours.  Recent Labs  11/28/14 0644  NA 139  K 4.0  CL 104  CO2 30  BUN 12  CREATININE 0.76  GLUCOSE 241*  CALCIUM 8.8   No results found for: INR, PROTIME  Examination:  General appearance: alert, cooperative and no distress Extremities: Homans sign is negative, no sign of DVT  Wound Exam: clean, dry, intact   Drainage:  Scant/small amount Serosanguinous exudate  Motor Exam: EHL and FHL Intact  Sensory Exam: Deep Peroneal normal   Assessment:    2 Days Post-Op  Procedure(s) (LRB): IRRIGATION AND DEBRIDEMENT WITH WOUND CLOSURE OF RIGHT LEG FASCIOTOMIES (Right)  ADDITIONAL DIAGNOSIS:  Active Problems:   Compartment syndrome, traumatic, lower extremity   Open  right tibial fracture   Penetrating foreign body of right knee   Right tibial fracture   Suicide attempt   Diabetes mellitus type 2, uncontrolled   Essential hypertension   Distal radius fracture  Acute Blood Loss Anemia   Plan: Physical Therapy as ordered Non Weight Bearing (NWB)    DISCHARGE PLAN: Inpatient Rehab  ?           Nabor Thomann 11/30/2014, 9:56 AM
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
## 2014-12-01 LAB — GLUCOSE, CAPILLARY
GLUCOSE-CAPILLARY: 167 mg/dL — AB (ref 70–99)
GLUCOSE-CAPILLARY: 137 mg/dL — AB (ref 70–99)
GLUCOSE-CAPILLARY: 164 mg/dL — AB (ref 70–99)
Glucose-Capillary: 193 mg/dL — ABNORMAL HIGH (ref 70–99)

## 2014-12-01 MED ORDER — INSULIN GLARGINE 100 UNIT/ML ~~LOC~~ SOLN
20.0000 [IU] | Freq: Every day | SUBCUTANEOUS | Status: DC
  Filled 2014-12-01 (×2): qty 0.2

## 2014-12-01 MED ORDER — INSULIN ASPART 100 UNIT/ML ~~LOC~~ SOLN
10.0000 [IU] | Freq: Three times a day (TID) | SUBCUTANEOUS | Status: DC
  Administered 2014-12-01 – 2014-12-02 (×4): 10 [IU] via SUBCUTANEOUS

## 2014-12-01 MED ORDER — INSULIN ASPART 100 UNIT/ML ~~LOC~~ SOLN: 8.0000 [IU] | Freq: Three times a day (TID) | SUBCUTANEOUS | Status: DC

## 2014-12-01 MED ORDER — INSULIN GLARGINE 100 UNIT/ML ~~LOC~~ SOLN
25.0000 [IU] | Freq: Every day | SUBCUTANEOUS | Status: DC
  Administered 2014-12-01: 25 [IU] via SUBCUTANEOUS
  Filled 2014-12-01 (×3): qty 0.25

## 2014-12-01 NOTE — Progress Notes (Signed)
SPORTS MEDICINE AND JOINT REPLACEMENT  Georgena SpurlingStephen Lucey, MD   Altamese CabalMaurice Davaris Youtsey, PA-C [address], [place], [place]  [id number]                             [phone]   PROGRESS NOTE  Subjective:  negative for Chest Pain  negative for Shortness of Breath  negative for Nausea/Vomiting   negative for Calf Pain  negative for Bowel Movement   Tolerating Diet: yes         Patient reports pain as 6 on 0-10 scale.    Objective: Vital signs in last 24 hours:   Patient Vitals for the past 24 hrs:  BP Temp Temp src Pulse Resp SpO2  12/01/14 0612 106/69 mmHg 98.7 F (37.1 C) Oral 94 18 95 %  11/30/14 2147 137/72 mmHg 97.8 F (36.6 C) Oral 91 18 98 %  11/30/14 1410 118/67 mmHg 98.3 F (36.8 C) Oral (!) 102 18 98 %    @flow {1959:LAST@   Intake/Output from previous day:   02/20 0701 - 02/21 0700 In: 480 [P.O.:480] Out: 950 [Urine:950]   Intake/Output this shift:       Intake/Output      02/20 0701 - 02/21 0700 02/21 0701 - 02/22 0700   P.O. 480    Total Intake 480     Urine 950    Total Output 950     Net -470          Urine Occurrence 3 x       LABORATORY DATA: No results for input(s): WBC, HGB, HCT, PLT in the last 168 hours.  Recent Labs  11/28/14 0644  NA 139  K 4.0  CL 104  CO2 30  BUN 12  CREATININE 0.76  GLUCOSE 241*  CALCIUM 8.8   No results found for: INR, PROTIME  Examination:  General appearance: alert, cooperative and no distress Extremities: Homans sign is negative, no sign of DVT  Wound Exam: clean, dry, intact   Drainage:  None: wound tissue dry  Motor Exam: EHL and FHL Intact  Sensory Exam: Deep Peroneal normal   Assessment:    3 Days Post-Op  Procedure(s) (LRB): IRRIGATION AND DEBRIDEMENT WITH WOUND CLOSURE OF RIGHT LEG FASCIOTOMIES (Right)  ADDITIONAL DIAGNOSIS:  Active Problems:   Compartment syndrome, traumatic, lower extremity   Open right tibial fracture   Penetrating foreign body of right knee   Right tibial  fracture   Suicide attempt   Diabetes mellitus type 2, uncontrolled   Essential hypertension   Distal radius fracture  Acute Blood Loss Anemia   Plan: Awaiting for insurance approval for inpt rehab. SNF placement if not approved for inpt rehab. Will continue to f/u.        Mliss Wedin 12/01/2014, 9:23 AM

## 2014-12-01 NOTE — Consult Note (Signed)
PROGRESS NOTE  [name] [name] YQM:[id number]:[id number] DOB: 01/21/1971 DOA: 11/17/2014 PCP: Sherrie MustacheJADALI,FAYEGH, MD  Assessment/Plan: DM- titrate lantus, continue SSI +novolog-- can be discharged on these meds and further titration in CIR or SNF Monitor blood sugars -metformin  Will sign off- please call with questions [phone]  Code Status: full Family Communication:  Disposition Plan:    Consultants:    Procedures:     HPI/Subjective: No overnight issues  Objective: Filed Vitals:   12/01/14 0612  BP: 106/69  Pulse: 94  Temp: 98.7 F (37.1 C)  Resp: 18    Intake/Output Summary (Last 24 hours) at 12/01/14 0905 Last data filed at 12/01/14 [id number]  Gross per 24 hour  Intake    240 ml  Output    950 ml  Net   -710 ml   There were no vitals filed for this visit.  Exam:   General:  pleasant  Cardiovascular:rrr   Data Reviewed: Basic Metabolic Panel:  Recent Labs Lab 11/28/14 0644  NA 139  K 4.0  CL 104  CO2 30  GLUCOSE 241*  BUN 12  CREATININE 0.76  CALCIUM 8.8   Liver Function Tests: No results for input(s): AST, ALT, ALKPHOS, BILITOT, PROT, ALBUMIN in the last 168 hours. No results for input(s): LIPASE, AMYLASE in the last 168 hours. No results for input(s): AMMONIA in the last 168 hours. CBC: No results for input(s): WBC, NEUTROABS, HGB, HCT, MCV, PLT in the last 168 hours. Cardiac Enzymes: No results for input(s): CKTOTAL, CKMB, CKMBINDEX, TROPONINI in the last 168 hours. BNP (last 3 results) No results for input(s): BNP in the last 8760 hours.  ProBNP (last 3 results) No results for input(s): PROBNP in the last 8760 hours.  CBG:  Recent Labs Lab 11/30/14 1130 11/30/14 1412 11/30/14 1647 11/30/14 2149 12/01/14 0608  GLUCAP 322* 242* 316* 246* 193*    No results found for this or any previous visit (from the past 240 hour(s)).   Studies: No results found.  Scheduled Meds: . docusate sodium  100 mg Oral BID  . enoxaparin (LOVENOX)  injection  40 mg Subcutaneous Q24H  . furosemide  40 mg Oral Daily  . insulin aspart  0-15 Units Subcutaneous TID WC  . insulin aspart  0-5 Units Subcutaneous QHS  . insulin aspart  8 Units Subcutaneous TID WC  . insulin glargine  20 Units Subcutaneous Daily  . lisinopril  40 mg Oral Daily  . metFORMIN  1,000 mg Oral Q breakfast  . pantoprazole  40 mg Oral Daily  . senna  1 tablet Oral BID  . tamsulosin  0.4 mg Oral Daily   Continuous Infusions: . dextrose 5 % and 0.45% NaCl 100 mL/hr at 11/28/14 2034  . lactated ringers 50 mL/hr at 11/21/14 1341  . lactated ringers 10 mL/hr at 11/28/14 1351   Antibiotics Given (last 72 hours)    Date/Time Action Medication Dose Rate   11/28/14 2027 Given   ceFAZolin (ANCEF) IVPB 2 g/50 mL premix 2 g 100 mL/hr   11/29/14 0219 Given   ceFAZolin (ANCEF) IVPB 2 g/50 mL premix 2 g 100 mL/hr   11/29/14 1100 Given   ceFAZolin (ANCEF) IVPB 2 g/50 mL premix 2 g 100 mL/hr      Active Problems:   Compartment syndrome, traumatic, lower extremity   Open right tibial fracture   Penetrating foreign body of right knee   Right tibial fracture   Suicide attempt   Diabetes mellitus type 2, uncontrolled   Essential hypertension  Distal radius fracture    Time spent: 25 min    [name] [name]  [facility] Pager [phone] If 7PM-7AM, please contact night-coverage at [email or web address], password [facility] 12/01/2014, 9:05 AM  LOS: 13 days

## 2014-12-02 ENCOUNTER — Inpatient Hospital Stay (HOSPITAL_COMMUNITY)
Admission: RE | Admit: 2014-12-02 | Discharge: 2014-12-12 | DRG: 561 | Disposition: A | Discharge status: Home / Self Care | Payer: Federal, State, Local not specified - PPO | Source: Intra-hospital | Attending: Physical Medicine & Rehabilitation | Admitting: Physical Medicine & Rehabilitation

## 2014-12-02 DIAGNOSIS — N400 Enlarged prostate without lower urinary tract symptoms: Secondary | ICD-10-CM | POA: Diagnosis present

## 2014-12-02 DIAGNOSIS — T79A29A Traumatic compartment syndrome of unspecified lower extremity, initial encounter: Secondary | ICD-10-CM | POA: Diagnosis present

## 2014-12-02 DIAGNOSIS — E1142 Type 2 diabetes mellitus with diabetic polyneuropathy: Secondary | ICD-10-CM | POA: Diagnosis present

## 2014-12-02 DIAGNOSIS — I10 Essential (primary) hypertension: Secondary | ICD-10-CM | POA: Diagnosis present

## 2014-12-02 DIAGNOSIS — S52509S Unspecified fracture of the lower end of unspecified radius, sequela: Secondary | ICD-10-CM | POA: Diagnosis not present

## 2014-12-02 DIAGNOSIS — K5900 Constipation, unspecified: Secondary | ICD-10-CM | POA: Diagnosis present

## 2014-12-02 DIAGNOSIS — F329 Major depressive disorder, single episode, unspecified: Secondary | ICD-10-CM | POA: Diagnosis present

## 2014-12-02 DIAGNOSIS — S82141D Displaced bicondylar fracture of right tibia, subsequent encounter for closed fracture with routine healing: Secondary | ICD-10-CM | POA: Diagnosis not present

## 2014-12-02 DIAGNOSIS — S52502D Unspecified fracture of the lower end of left radius, subsequent encounter for closed fracture with routine healing: Secondary | ICD-10-CM | POA: Diagnosis present

## 2014-12-02 DIAGNOSIS — S82141S Displaced bicondylar fracture of right tibia, sequela: Secondary | ICD-10-CM | POA: Diagnosis not present

## 2014-12-02 DIAGNOSIS — F419 Anxiety disorder, unspecified: Secondary | ICD-10-CM | POA: Diagnosis present

## 2014-12-02 DIAGNOSIS — S52502S Unspecified fracture of the lower end of left radius, sequela: Secondary | ICD-10-CM | POA: Diagnosis not present

## 2014-12-02 DIAGNOSIS — Z79899 Other long term (current) drug therapy: Secondary | ICD-10-CM

## 2014-12-02 DIAGNOSIS — T79A0XD Compartment syndrome, unspecified, subsequent encounter: Secondary | ICD-10-CM

## 2014-12-02 DIAGNOSIS — Z89512 Acquired absence of left leg below knee: Secondary | ICD-10-CM | POA: Diagnosis not present

## 2014-12-02 DIAGNOSIS — S82191S Other fracture of upper end of right tibia, sequela: Secondary | ICD-10-CM

## 2014-12-02 DIAGNOSIS — F17210 Nicotine dependence, cigarettes, uncomplicated: Secondary | ICD-10-CM | POA: Diagnosis present

## 2014-12-02 DIAGNOSIS — S52509A Unspecified fracture of the lower end of unspecified radius, initial encounter for closed fracture: Secondary | ICD-10-CM | POA: Diagnosis present

## 2014-12-02 DIAGNOSIS — S82141A Displaced bicondylar fracture of right tibia, initial encounter for closed fracture: Secondary | ICD-10-CM

## 2014-12-02 DIAGNOSIS — S52501S Unspecified fracture of the lower end of right radius, sequela: Secondary | ICD-10-CM | POA: Diagnosis not present

## 2014-12-02 DIAGNOSIS — T79A21S Traumatic compartment syndrome of right lower extremity, sequela: Secondary | ICD-10-CM | POA: Diagnosis not present

## 2014-12-02 LAB — CBC
HCT: 27.9 % — ABNORMAL LOW (ref 39.0–52.0)
HEMOGLOBIN: 9 g/dL — AB (ref 13.0–17.0)
MCH: 29.7 pg (ref 26.0–34.0)
MCHC: 32.3 g/dL (ref 30.0–36.0)
MCV: 92.1 fL (ref 78.0–100.0)
Platelets: 562 10*3/uL — ABNORMAL HIGH (ref 150–400)
RBC: 3.03 MIL/uL — AB (ref 4.22–5.81)
RDW: 14.1 % (ref 11.5–15.5)
WBC: 7.9 10*3/uL (ref 4.0–10.5)

## 2014-12-02 LAB — GLUCOSE, CAPILLARY
GLUCOSE-CAPILLARY: 214 mg/dL — AB (ref 70–99)
Glucose-Capillary: 189 mg/dL — ABNORMAL HIGH (ref 70–99)
Glucose-Capillary: 175 mg/dL — ABNORMAL HIGH (ref 70–99)

## 2014-12-02 LAB — CREATININE, SERUM
Creatinine, Ser: 0.96 mg/dL (ref 0.50–1.35)
GFR calc Af Amer: >90 mL/min (ref >90)

## 2014-12-02 MED ORDER — INSULIN GLARGINE 100 UNIT/ML ~~LOC~~ SOLN
30.0000 [IU] | Freq: Every day | SUBCUTANEOUS | Status: DC
  Administered 2014-12-02: 30 [IU] via SUBCUTANEOUS
  Filled 2014-12-02: qty 0.3

## 2014-12-02 MED ORDER — LISINOPRIL 40 MG PO TABS
40.0000 mg | ORAL_TABLET | Freq: Every day | ORAL | Status: DC
  Administered 2014-12-03 – 2014-12-12 (×10): 40 mg via ORAL
  Filled 2014-12-02 (×12): qty 1

## 2014-12-02 MED ORDER — TRAZODONE HCL 50 MG PO TABS: 100.0000 mg | ORAL_TABLET | Freq: Every evening | ORAL | Status: DC | PRN

## 2014-12-02 MED ORDER — TAMSULOSIN HCL 0.4 MG PO CAPS
0.4000 mg | ORAL_CAPSULE | Freq: Every day | ORAL | Status: DC
  Administered 2014-12-03 – 2014-12-12 (×10): 0.4 mg via ORAL
  Filled 2014-12-02 (×12): qty 1

## 2014-12-02 MED ORDER — ONDANSETRON HCL 4 MG PO TABS
4.0000 mg | ORAL_TABLET | Freq: Four times a day (QID) | ORAL | Status: DC | PRN
  Administered 2014-12-05: 4 mg via ORAL
  Filled 2014-12-02: qty 1

## 2014-12-02 MED ORDER — ALUM & MAG HYDROXIDE-SIMETH 200-200-20 MG/5ML PO SUSP: 30.0000 mL | ORAL | Status: DC | PRN

## 2014-12-02 MED ORDER — INSULIN GLARGINE 100 UNIT/ML ~~LOC~~ SOLN: 30.0000 [IU] | Freq: Every day | SUBCUTANEOUS | Status: DC

## 2014-12-02 MED ORDER — METHOCARBAMOL 500 MG PO TABS
500.0000 mg | ORAL_TABLET | Freq: Four times a day (QID) | ORAL | Status: DC | PRN
  Administered 2014-12-02 – 2014-12-11 (×29): 500 mg via ORAL
  Filled 2014-12-02 (×29): qty 1

## 2014-12-02 MED ORDER — ENOXAPARIN SODIUM 40 MG/0.4ML ~~LOC~~ SOLN
40.0000 mg | SUBCUTANEOUS | Status: DC
  Administered 2014-12-03 – 2014-12-12 (×10): 40 mg via SUBCUTANEOUS
  Filled 2014-12-02 (×12): qty 0.4

## 2014-12-02 MED ORDER — INSULIN GLARGINE 100 UNIT/ML ~~LOC~~ SOLN
30.0000 [IU] | Freq: Every day | SUBCUTANEOUS | Status: DC
  Administered 2014-12-03 – 2014-12-12 (×10): 30 [IU] via SUBCUTANEOUS
  Filled 2014-12-02 (×11): qty 0.3

## 2014-12-02 MED ORDER — OXYCODONE HCL 5 MG PO TABS
5.0000 mg | ORAL_TABLET | ORAL | Status: DC | PRN
  Administered 2014-12-02: 10 mg via ORAL
  Filled 2014-12-02: qty 2

## 2014-12-02 MED ORDER — ONDANSETRON HCL 4 MG/2ML IJ SOLN: 4.0000 mg | Freq: Four times a day (QID) | INTRAMUSCULAR | Status: DC | PRN

## 2014-12-02 MED ORDER — ENOXAPARIN SODIUM 40 MG/0.4ML ~~LOC~~ SOLN: 40.0000 mg | SUBCUTANEOUS | Status: DC

## 2014-12-02 MED ORDER — ACETAMINOPHEN 325 MG PO TABS: 325.0000 mg | ORAL_TABLET | ORAL | Status: DC | PRN

## 2014-12-02 MED ORDER — OXYCODONE HCL 5 MG PO TABS
10.0000 mg | ORAL_TABLET | ORAL | Status: DC | PRN
  Administered 2014-12-02 – 2014-12-04 (×12): 20 mg via ORAL
  Filled 2014-12-02 (×12): qty 4

## 2014-12-02 MED ORDER — TRAZODONE HCL 50 MG PO TABS: 100.0000 mg | ORAL_TABLET | Freq: Every day | ORAL | Status: DC

## 2014-12-02 MED ORDER — BISACODYL 10 MG RE SUPP: 10.0000 mg | Freq: Every day | RECTAL | Status: DC | PRN

## 2014-12-02 MED ORDER — METFORMIN HCL 500 MG PO TABS
1000.0000 mg | ORAL_TABLET | Freq: Every day | ORAL | Status: DC
  Administered 2014-12-03 – 2014-12-12 (×10): 1000 mg via ORAL
  Filled 2014-12-02 (×12): qty 2

## 2014-12-02 MED ORDER — INSULIN ASPART 100 UNIT/ML ~~LOC~~ SOLN
0.0000 [IU] | Freq: Three times a day (TID) | SUBCUTANEOUS | Status: DC
  Administered 2014-12-02: 3 [IU] via SUBCUTANEOUS
  Administered 2014-12-03: 5 [IU] via SUBCUTANEOUS
  Administered 2014-12-03 – 2014-12-04 (×2): 3 [IU] via SUBCUTANEOUS
  Administered 2014-12-04: 2 [IU] via SUBCUTANEOUS
  Administered 2014-12-04: 3 [IU] via SUBCUTANEOUS
  Administered 2014-12-05: 2 [IU] via SUBCUTANEOUS
  Administered 2014-12-05 (×2): 3 [IU] via SUBCUTANEOUS
  Administered 2014-12-06: 5 [IU] via SUBCUTANEOUS
  Administered 2014-12-06 (×2): 2 [IU] via SUBCUTANEOUS
  Administered 2014-12-07 (×2): 3 [IU] via SUBCUTANEOUS
  Administered 2014-12-07: 2 [IU] via SUBCUTANEOUS
  Administered 2014-12-08: 3 [IU] via SUBCUTANEOUS
  Administered 2014-12-08: 2 [IU] via SUBCUTANEOUS
  Administered 2014-12-08 – 2014-12-09 (×2): 3 [IU] via SUBCUTANEOUS
  Administered 2014-12-09: 2 [IU] via SUBCUTANEOUS
  Administered 2014-12-09: 3 [IU] via SUBCUTANEOUS
  Administered 2014-12-10: 2 [IU] via SUBCUTANEOUS
  Administered 2014-12-10 – 2014-12-11 (×3): 3 [IU] via SUBCUTANEOUS
  Administered 2014-12-11 (×2): 2 [IU] via SUBCUTANEOUS
  Administered 2014-12-12: 3 [IU] via SUBCUTANEOUS

## 2014-12-02 MED ORDER — INSULIN ASPART 100 UNIT/ML ~~LOC~~ SOLN: 10.0000 [IU] | Freq: Three times a day (TID) | SUBCUTANEOUS | Status: DC

## 2014-12-02 MED ORDER — ALPRAZOLAM 0.5 MG PO TABS
0.5000 mg | ORAL_TABLET | Freq: Three times a day (TID) | ORAL | Status: DC | PRN
  Administered 2014-12-02 – 2014-12-12 (×23): 0.5 mg via ORAL
  Filled 2014-12-02 (×26): qty 1

## 2014-12-02 MED ORDER — DOCUSATE SODIUM 100 MG PO CAPS
100.0000 mg | ORAL_CAPSULE | Freq: Two times a day (BID) | ORAL | Status: DC
  Administered 2014-12-02 – 2014-12-12 (×20): 100 mg via ORAL
  Filled 2014-12-02 (×23): qty 1

## 2014-12-02 MED ORDER — INSULIN ASPART 100 UNIT/ML ~~LOC~~ SOLN
10.0000 [IU] | Freq: Three times a day (TID) | SUBCUTANEOUS | Status: DC
Start: 2014-12-02 — End: 2014-12-12
  Administered 2014-12-02 – 2014-12-12 (×26): 10 [IU] via SUBCUTANEOUS

## 2014-12-02 MED ORDER — FUROSEMIDE 40 MG PO TABS
40.0000 mg | ORAL_TABLET | Freq: Every day | ORAL | Status: DC
  Administered 2014-12-03 – 2014-12-12 (×10): 40 mg via ORAL
  Filled 2014-12-02 (×12): qty 1

## 2014-12-02 MED ORDER — POLYETHYLENE GLYCOL 3350 17 G PO PACK
17.0000 g | PACK | Freq: Every day | ORAL | Status: DC | PRN
Start: 2014-12-02 — End: 2014-12-12
  Administered 2014-12-05 (×2): 17 g via ORAL
  Filled 2014-12-02 (×3): qty 1

## 2014-12-02 MED ORDER — SORBITOL 70 % SOLN: 30.0000 mL | Freq: Every day | Status: DC | PRN

## 2014-12-02 MED ORDER — SENNA 8.6 MG PO TABS
1.0000 | ORAL_TABLET | Freq: Two times a day (BID) | ORAL | Status: DC
Start: 2014-12-02 — End: 2014-12-05
  Administered 2014-12-02 – 2014-12-05 (×6): 8.6 mg via ORAL
  Filled 2014-12-02 (×8): qty 1

## 2014-12-02 MED ORDER — PANTOPRAZOLE SODIUM 40 MG PO TBEC
40.0000 mg | DELAYED_RELEASE_TABLET | Freq: Every day | ORAL | Status: DC
  Administered 2014-12-03 – 2014-12-12 (×10): 40 mg via ORAL
  Filled 2014-12-02 (×10): qty 1

## 2014-12-02 MED ORDER — FLEET ENEMA 7-19 GM/118ML RE ENEM
1.0000 | ENEMA | Freq: Once | RECTAL | Status: AC
Start: 2014-12-02 — End: 2014-12-02
  Administered 2014-12-02: 1 via RECTAL
  Filled 2014-12-02: qty 1
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
## 2014-12-02 NOTE — Progress Notes (Signed)
Pt admitted to unit at 1615 with wife at bedside. Educated pt and wife on rehab process, booklet, and safety protocol. Pt resting in bed with call bell with in reach and wife at bedside.

## 2014-12-02 NOTE — Progress Notes (Signed)
Pt is utilizing all forms of pain meds prescribed PO and IV as well as muscle relaxer and benzo. I asked if the pain meds are helping and pt stated that they help the pain ease off for about an hour (not totally go away) and then the pain goes up to an 8 again. He is also not able to sleep very long d/t pain. I told pt that I would let the doctor know. Maybe something needs to be adjusted to give him more relief and longer.
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
## 2014-12-02 NOTE — Addendum Note (Signed)
Addendum  created 12/02/14 1151 by Sebastian Acheheodore Deseray Daponte, MD   Modules edited: Anesthesia Responsible Staff

## 2014-12-02 NOTE — Progress Notes (Signed)
I have insurance approval to admit pt to inpt rehab today. Pt is in agreement. I contacted Janalee DaneBrittney Kelly, PA and she is aware and will discharge pt after rounding this afternoon. RN CM and SW are aware. [phone]
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
## 2014-12-03 ENCOUNTER — Encounter (HOSPITAL_COMMUNITY): Payer: Self-pay

## 2014-12-03 ENCOUNTER — Inpatient Hospital Stay (HOSPITAL_COMMUNITY): Payer: Federal, State, Local not specified - PPO | Admitting: *Deleted

## 2014-12-03 ENCOUNTER — Inpatient Hospital Stay (HOSPITAL_COMMUNITY): Payer: Self-pay

## 2014-12-03 DIAGNOSIS — Z89512 Acquired absence of left leg below knee: Secondary | ICD-10-CM

## 2014-12-03 DIAGNOSIS — S82141S Displaced bicondylar fracture of right tibia, sequela: Secondary | ICD-10-CM

## 2014-12-03 DIAGNOSIS — M7989 Other specified soft tissue disorders: Secondary | ICD-10-CM

## 2014-12-03 DIAGNOSIS — S82141A Displaced bicondylar fracture of right tibia, initial encounter for closed fracture: Secondary | ICD-10-CM

## 2014-12-03 DIAGNOSIS — S52502S Unspecified fracture of the lower end of left radius, sequela: Secondary | ICD-10-CM

## 2014-12-03 LAB — CBC WITH DIFFERENTIAL/PLATELET
Basophils Absolute: 0 10*3/uL (ref 0.0–0.1)
Basophils Relative: 1 % (ref 0–1)
EOS ABS: 0.2 10*3/uL (ref 0.0–0.7)
Eosinophils Relative: 2 % (ref 0–5)
HCT: 26.7 % — ABNORMAL LOW (ref 39.0–52.0)
HEMOGLOBIN: 8.5 g/dL — AB (ref 13.0–17.0)
Lymphocytes Relative: 21 % (ref 12–46)
Lymphs Abs: 1.7 10*3/uL (ref 0.7–4.0)
MCH: 29.1 pg (ref 26.0–34.0)
MCHC: 31.8 g/dL (ref 30.0–36.0)
MCV: 91.4 fL (ref 78.0–100.0)
MONOS PCT: 6 % (ref 3–12)
Monocytes Absolute: 0.5 10*3/uL (ref 0.1–1.0)
NEUTROS ABS: 5.6 10*3/uL (ref 1.7–7.7)
NEUTROS PCT: 70 % (ref 43–77)
Platelets: 536 10*3/uL — ABNORMAL HIGH (ref 150–400)
RBC: 2.92 MIL/uL — AB (ref 4.22–5.81)
RDW: 14.1 % (ref 11.5–15.5)
WBC: 8 10*3/uL (ref 4.0–10.5)

## 2014-12-03 LAB — COMPREHENSIVE METABOLIC PANEL
ALT: 30 U/L (ref 0–53)
ANION GAP: 6 (ref 5–15)
AST: 30 U/L (ref 0–37)
Albumin: 2.9 g/dL — ABNORMAL LOW (ref 3.5–5.2)
Alkaline Phosphatase: 86 U/L (ref 39–117)
BUN: 20 mg/dL (ref 6–23)
CHLORIDE: 104 mmol/L (ref 96–112)
CO2: 25 mmol/L (ref 19–32)
CREATININE: 0.79 mg/dL (ref 0.50–1.35)
Calcium: 8.6 mg/dL (ref 8.4–10.5)
GFR calc non Af Amer: >90 mL/min (ref >90)
Glucose, Bld: 230 mg/dL — ABNORMAL HIGH (ref 70–99)
Potassium: 3.9 mmol/L (ref 3.5–5.1)
Sodium: 135 mmol/L (ref 135–145)
Total Bilirubin: 0.4 mg/dL (ref 0.3–1.2)
Total Protein: 6.2 g/dL (ref 6.0–8.3)

## 2014-12-03 LAB — GLUCOSE, CAPILLARY
GLUCOSE-CAPILLARY: 161 mg/dL — AB (ref 70–99)
Glucose-Capillary: 176 mg/dL — ABNORMAL HIGH (ref 70–99)
Glucose-Capillary: 212 mg/dL — ABNORMAL HIGH (ref 70–99)
Glucose-Capillary: 209 mg/dL — ABNORMAL HIGH (ref 70–99)
Glucose-Capillary: 167 mg/dL — ABNORMAL HIGH (ref 70–99)

## 2014-12-03 NOTE — Progress Notes (Signed)
Orthopedic Tech Progress Note Patient Details:  [name] Dionesnthony Maiden 11/24/1970 [id number] Advanced called for assessment as ordered  Patient ID: [name] [name], male   DOB: 08/10/1971, 44 y.o.   MRN: [id number]   [name] 12/03/2014, 11:25 AM
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
## 2014-12-03 NOTE — Progress Notes (Signed)
Occupational Therapy Session Note  Patient Details  Name: [name] [name] MRN: [id number] Date of Birth: 06/11/1971  Today's Date: 12/03/2014 OT Individual Time: 1300-1400 OT Individual Time Calculation (min): 60 min    Short Term Goals: Week 1:  OT Short Term Goal 1 (Week 1): Pt [facility] supervision level with mod cues OT Short Term Goal 2 (Week 1): Pt will complete LB dressing with min assist and use of AE, prn OT Short Term Goal 3 (Week 1): Pt will toileting task at supervision level with min cues OT Short Term Goal 4 (Week 1): Pt will adhere to weight bearing precautions during 45-60 min session with min cues   Skilled Therapeutic Interventions/Progress Updates:  1:1 OT session focused on use of AE, lateral leans, activity tolerance, functional transfers, w/c mobility, and safety. Pt received supine in bed agreeable to therapy. Completed toileting with use of urinal at setup assist then practiced LB dressing and hygiene while sitting EOB using lateral lean technique. Pt required multiple rest breaks during lateral leans d/t fatigue. Discussed alternative option of completing while supine in bed to conserve energy. Completed LB dressing with use of AE and mod assist. Pt practiced SBT bed>w/c (one arm drive) and at min A while wearing prosthesis. Practiced w/c mobility in hallway with max cues for technique and sequencing. Pt returned to room and completed SBT w/c>bed with min A for managing board. Pt left supine in bed with all needs in reach. Pt required mod cues for adherence to precautions throughout session.   Therapy Documentation Precautions:  Precautions Precautions: Fall Required Braces or Orthoses:  (soft splint LUE) Restrictions Weight Bearing Restrictions: Yes LUE Weight Bearing: Weight bear through elbow only RLE Weight Bearing: Non weight bearing Other Position/Activity Restrictions: L BKA with prosthesis General:   Vital Signs:  Pain: Pain  Assessment Pain Assessment: 0-10 Pain Score: 8  Pain Location: Leg Pain Orientation: Right Pain Descriptors / Indicators: Aching Pain Onset: On-going Patients Stated Pain Goal: 7 Pain Intervention(s): Medication (See eMAR)  See FIM for current functional status  Therapy/Group: Individual Therapy  Daneil Danerkinson, Arlone Lenhardt N 12/03/2014, 2:43 PM

## 2014-12-03 NOTE — Progress Notes (Signed)
Patient information reviewed and entered into eRehab system by Lakshya Mcgillicuddy, RN, CRRN, PPS Coordinator.  Information including medical coding and functional independence measure will be reviewed and updated through discharge.     Per nursing patient was given "Data Collection Information Summary for Patients in Inpatient Rehabilitation Facilities with attached "Privacy Act Statement-Health Care Records" upon admission.  

## 2014-12-03 NOTE — Progress Notes (Signed)
*  Preliminary Results* Right lower extremity venous duplex completed. Right lower extremity is negative for deep vein thrombosis. There is no evidence of right Baker's cyst.  12/03/2014 3:43 PM  Gertie FeyMichelle Tyauna Lacaze, RVT, RDCS, RDMS
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
## 2014-12-04 ENCOUNTER — Encounter (HOSPITAL_COMMUNITY): Payer: Self-pay | Admitting: Orthopedic Surgery

## 2014-12-04 ENCOUNTER — Inpatient Hospital Stay (HOSPITAL_COMMUNITY): Payer: Federal, State, Local not specified - PPO | Admitting: *Deleted

## 2014-12-04 ENCOUNTER — Inpatient Hospital Stay (HOSPITAL_COMMUNITY): Payer: Self-pay

## 2014-12-04 ENCOUNTER — Encounter (HOSPITAL_COMMUNITY): Payer: Self-pay

## 2014-12-04 DIAGNOSIS — Z89512 Acquired absence of left leg below knee: Secondary | ICD-10-CM

## 2014-12-04 DIAGNOSIS — T79A21S Traumatic compartment syndrome of right lower extremity, sequela: Secondary | ICD-10-CM

## 2014-12-04 DIAGNOSIS — S52501S Unspecified fracture of the lower end of right radius, sequela: Secondary | ICD-10-CM

## 2014-12-04 LAB — GLUCOSE, CAPILLARY
GLUCOSE-CAPILLARY: 170 mg/dL — AB (ref 70–99)
Glucose-Capillary: 173 mg/dL — ABNORMAL HIGH (ref 70–99)
Glucose-Capillary: 147 mg/dL — ABNORMAL HIGH (ref 70–99)
Glucose-Capillary: 144 mg/dL — ABNORMAL HIGH (ref 70–99)

## 2014-12-04 MED ORDER — OXYCODONE HCL ER 10 MG PO T12A
20.0000 mg | EXTENDED_RELEASE_TABLET | Freq: Two times a day (BID) | ORAL | Status: DC
  Administered 2014-12-04 – 2014-12-12 (×17): 20 mg via ORAL
  Filled 2014-12-04 (×18): qty 2

## 2014-12-04 MED ORDER — OXYCODONE HCL 5 MG PO TABS
10.0000 mg | ORAL_TABLET | ORAL | Status: DC | PRN
  Administered 2014-12-04 – 2014-12-12 (×46): 20 mg via ORAL
  Filled 2014-12-04 (×3): qty 4
  Filled 2014-12-04: qty 2
  Filled 2014-12-04 (×43): qty 4
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
## 2014-12-04 NOTE — Progress Notes (Signed)
Occupational Therapy Session Note  Patient Details  Name: [name] [name] MRN: [id number] Date of Birth: 11/14/1970  Today's Date: 12/04/2014 OT Individual Time: 0700-0800 and 1100-1200 OT Individual Time Calculation (min): 60 min and 60 min     Short Term Goals: Week 1:  OT Short Term Goal 1 (Week 1): Pt [facility] supervision level with mod cues OT Short Term Goal 2 (Week 1): Pt will complete LB dressing with min assist and use of AE, prn OT Short Term Goal 3 (Week 1): Pt will toileting task at supervision level with min cues OT Short Term Goal 4 (Week 1): Pt will adhere to weight bearing precautions during 45-60 min session with min cues   Skilled Therapeutic Interventions/Progress Updates:    Session 1: Pt seen for ADL retraining with focus on lateral leans, functional transfers, activity tolerance, and use of AE. Pt received supine in bed eager for bathing and dressing. Pt demonstrating good anticipatory awareness as he was planning for setup of ADLs and discharge. Completed bathing and dressing sitting EOB with min A for drying and donning R sock. Utilized reacher with increased time to thread BLEs into pants. Introduced sock-aid however pt declining practice secondary to reporting he was going to wear slippers only at home.   Session 2: Pt seen for 1:1 OT session with focus on activity tolerance, energy conservation, adherence to precautions, discharge planning, and functional transfers. Pt received supine in bed agreeable to therapy. Donned prosthesis with setup assist then completed squat pivot transfer bed>w/c at supervision level with min cues for setup and adherence to precautions. Propelled self on/off unit in community environment 500+ feet with few rest breaks to increase activity tolerance. Pt returned to unit to complete laundry task with setup assist to operate machine. Completed squat pivot transfers w/c<>couch and w/c<>apartment bed at supervision level with min  cues. Pt with increased difficulty during furniture transfer and reported he will not likely sit on couch for energy conservation. Went into kitchen and discussed setup, safety, energy conservation for simple meal prep at home, however pt continues to decline participating in task. Returned to room and pt completed squat pivot transfer w/c>bed at supervision level and required min A for sit>supine to manage RLE. Pt utilized urinal independently then left reclined in bed for lunch.   Therapy Documentation Precautions:  Precautions Precautions: Fall Required Braces or Orthoses:  (soft splint LUE) Restrictions Weight Bearing Restrictions: Yes LUE Weight Bearing: Non weight bearing RLE Weight Bearing: Non weight bearing Other Position/Activity Restrictions: L BKA with prosthesis General:   Vital Signs:   Pain: Pain Assessment Pain Score: 7  Pain Location: Wrist Pain Orientation: Left Pain Descriptors / Indicators: Aching Pain Intervention(s): Medication (See eMAR) Multiple Pain Sites: Yes 2nd Pain Site Pain Score: 6 Pain Location: Leg Pain Orientation: Right Pain Descriptors / Indicators: Aching Pain Intervention(s): Medication (See eMAR)  See FIM for current functional status  Therapy/Group: Individual Therapy  Daneil Danerkinson, Shaima Sardinas N 12/04/2014, 7:33 AM
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
## 2014-12-04 NOTE — Progress Notes (Signed)
[facility]patient Rehabilitation Center Individual Statement of Services  Patient Name:  [name] [name]  Date:  12/04/2014  Welcome to the [facility]patient [facility].  Our goal is to provide you with an individualized program based on your diagnosis and situation, designed to meet your specific needs.  With this comprehensive rehabilitation program, you will be expected to participate in at least 3 hours of rehabilitation therapies Monday-Friday, with modified therapy programming on the weekends.  Your rehabilitation program will include the following services:  Physical Therapy (PT), Occupational Therapy (OT), 24 hour per day rehabilitation nursing, Neuropsychology, Case Management (Social Worker), Rehabilitation Medicine, Nutrition Services and Pharmacy Services  Weekly team conferences will be held on Tuesdays to discuss your progress.  Your Social Worker will talk with you frequently to get your input and to update you on team discussions.  Team conferences with you and your family in attendance may also be held.  Expected length of stay:  10-12 days  Overall anticipated outcome:  Modified Independent with wheelchair  Depending on your progress and recovery, your program may change. Your Social Worker will coordinate services and will keep you informed of any changes. Your Social Worker's name and contact numbers are listed  below.  The following services may also be recommended but are not provided by the Inpatient Rehabilitation Center:   Driving Evaluations  Home Health Rehabiltiation Services  Outpatient Rehabilitation Services   Arrangements will be made to provide these services after discharge if needed.  Arrangements include referral to agencies that provide these services.  Your insurance has been verified to be:  [employer] Blue Shield Your primary doctor is:  Dr. Sherrie MustacheFayegh Jadali  Pertinent information will be shared with your doctor and your insurance company.  Social  Worker:  Staci AcostaJenny Serene Kopf, LCSW  [phone] or ([phone]) [phone]  Information discussed with and copy given to patient by: Elvera LennoxPrevatt, [place] Frith Capps, 12/04/2014, 10:06 AM
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
## 2014-12-04 NOTE — Progress Notes (Signed)
Orthopedic Tech Progress Note Patient Details:  [name] [name] 08/01/1971 [id number]  Patient ID: [name] [name], male   DOB: 03/29/1971, 44 y.o.   MRN: [id number] Called in advanced brace order; spoke with Francella SolianNadia  Kimberlea Schlag 12/04/2014, 12:12 PM

## 2014-12-04 NOTE — Progress Notes (Signed)
Physical Therapy Session Note  Patient Details  Name: [name] [name] MRN: [id number] Date of Birth: 01/04/1971  Today's Date: 12/04/2014 PT Individual Time: 0900-1000 PT Individual Time Calculation (min): 60 min   Short Term Goals: Week 1:  PT Short Term Goal 1 (Week 1): STGs=LTGs secondary to ELOS  Skilled Therapeutic Interventions/Progress Updates:    Patient received sitting in wheelchair. Patient reporting that [employer] present yesterday to assess L LE prosthesis and patient states he will require new liner and sock, which will be delivered today sometime. Have adjusted POC per this information, with addition of gait goals in controlled and home environments as well as standing balance goals. Discussion with patient about how he plans on leaving his wife and only plans on staying in his current home for about a month before moving to handicapped independent living in [place].  Session focused on wheelchair mobility in R one arm drive, (which patient will likely not require once L prosthesis fits because he will be able to propel with R UE and L LE), functional transfers, and R LE strengthening. Wheelchair mobility with R one arm drive and L LE (not efficient and without good alignment of L prosthesis) >150' with supervision/verbal cues for sequencing and efficiency. Slideboard transfer wheelchair>mat and squat pivot transfer mat>wheelchair and wheelchair>bed with supervision, min cues for wheelchair parts management (leg rests) with patient initiating set up independently; minA for sit<>supine for R LE management only. Supine with wedge for comfort: R LE quad sets, therapist assisted R SAQ, self-assisted heels slides with sheet, ankle pumps; 2x10.   Re-wrapped ace wrap on R LE for increased patient comfort. Ongoing discussion/education throughout session about prognosis, discharge planning, POC, WB status progression (referred back to MD instruction), etc. Educated patient that as soon as new  liner and sock arrive, will address standing and ambulation. Patient returned to room and left supine in bed with all needs within reach and bed alarm on.  Therapy Documentation Precautions:  Precautions Precautions: Fall Required Braces or Orthoses: Other Brace/Splint Other Brace/Splint: soft splint L UE Restrictions Weight Bearing Restrictions: Yes LUE Weight Bearing: Weight bear through elbow only RLE Weight Bearing: Non weight bearing Other Position/Activity Restrictions: L BKA with prosthesis Pain: Pain Assessment Pain Score: 5  Pain Location: Wrist Pain Orientation: Left Pain Descriptors / Indicators: Aching Pain Intervention(s): Repositioned;Ambulation/increased activity Multiple Pain Sites: Yes 2nd Pain Site Pain Score: 6 Pain Location: Leg Pain Orientation: Right Pain Descriptors / Indicators: Aching Pain Intervention(s): Repositioned;Ambulation/increased activity Locomotion : Ambulation Ambulation/Gait Assistance: Not tested (comment) Wheelchair Mobility Distance: 150   See FIM for current functional status  Therapy/Group: Individual Therapy  Chipper HerbBridget S Balbina Depace S. Kelsi Benham, PT, DPT 12/04/2014, 9:54 AM

## 2014-12-05 ENCOUNTER — Encounter (HOSPITAL_COMMUNITY): Payer: Self-pay

## 2014-12-05 ENCOUNTER — Inpatient Hospital Stay (HOSPITAL_COMMUNITY): Payer: Self-pay | Admitting: Physical Therapy

## 2014-12-05 ENCOUNTER — Inpatient Hospital Stay (HOSPITAL_COMMUNITY): Payer: Federal, State, Local not specified - PPO | Admitting: *Deleted

## 2014-12-05 ENCOUNTER — Inpatient Hospital Stay (HOSPITAL_COMMUNITY): Payer: Federal, State, Local not specified - PPO

## 2014-12-05 LAB — GLUCOSE, CAPILLARY
Glucose-Capillary: 177 mg/dL — ABNORMAL HIGH (ref 70–99)
Glucose-Capillary: 148 mg/dL — ABNORMAL HIGH (ref 70–99)
Glucose-Capillary: 152 mg/dL — ABNORMAL HIGH (ref 70–99)
Glucose-Capillary: 163 mg/dL — ABNORMAL HIGH (ref 70–99)

## 2014-12-05 MED ORDER — FLEET ENEMA 7-19 GM/118ML RE ENEM
1.0000 | ENEMA | Freq: Every day | RECTAL | Status: DC | PRN
  Administered 2014-12-06: 1 via RECTAL
  Filled 2014-12-05: qty 1

## 2014-12-05 MED ORDER — SENNA 8.6 MG PO TABS
2.0000 | ORAL_TABLET | Freq: Two times a day (BID) | ORAL | Status: DC
  Administered 2014-12-05 – 2014-12-12 (×14): 17.2 mg via ORAL
  Filled 2014-12-05 (×20): qty 2

## 2014-12-05 MED ORDER — POLYETHYLENE GLYCOL 3350 17 G PO PACK
17.0000 g | PACK | Freq: Every day | ORAL | Status: DC
  Administered 2014-12-06 – 2014-12-12 (×7): 17 g via ORAL
  Filled 2014-12-05 (×9): qty 1

## 2014-12-05 NOTE — Progress Notes (Signed)
Occupational Therapy Session Note  Patient Details  Name: [name] [name] MRN: [id number] Date of Birth: 10/19/1970  Today's Date: 12/05/2014 OT Individual Time: 0700-0800 OT Individual Time Calculation (min): 60 min    Short Term Goals: Week 1:  OT Short Term Goal 1 (Week 1): [facility] with mod cues OT Short Term Goal 2 (Week 1): Pt will complete LB dressing with min assist and use of AE, prn OT Short Term Goal 3 (Week 1): Pt will toileting task at supervision level with min cues OT Short Term Goal 4 (Week 1): Pt will adhere to weight bearing precautions during 45-60 min session with min cues   Skilled Therapeutic Interventions/Progress Updates:    Pt seen for ADL retraining with focus on functional transfers, lateral leans, adherence to precautions, and activity tolerance. Pt received supine in bed. Completed bathing and dressing sitting EOB with setup assist for bathing. Pt utilized reacher to assist with LB dressing without cues. Pt reporting increased nausea this AM, therefore required assist to manage LB dressing around waist. Pt donned prosthetic with setup assist then completed squat pivot transfer bed>w/c at supervision level. Pt propelled self room>nurses station to retrieve drink and planned to complete laundry however washing machine was occupied. Pt required min cues for problem solving to manage drink while propelling w/c. Returned to room and discussed goals of therapy. Pt encouraged to practice IADLs, toilet transfers, and clothing management however pt declines stating "I have no problem with that." Pt then reports having his wife to assist with certain activities. Reinforced role of OT and goals of Modified Independent. Pt left sitting in w/c to eat breakfast.   Therapy Documentation Precautions:  Precautions Precautions: Fall Required Braces or Orthoses: Other Brace/Splint Other Brace/Splint: soft splint L UE Restrictions Weight  Bearing Restrictions: Yes LUE Weight Bearing: Non weight bearing (through wrist) RLE Weight Bearing: Non weight bearing Other Position/Activity Restrictions: L BKA with prosthesis General:   Vital Signs:   Pain: Pain Assessment Pain Assessment: 0-10 Pain Score: 7  Pain Type: Acute pain Pain Location: Leg Pain Orientation: Right Pain Descriptors / Indicators: Aching Pain Frequency: Intermittent Pain Onset: With Activity Patients Stated Pain Goal: 5 Pain Intervention(s): Medication (See eMAR) ADL:   Exercises:   Other Treatments:    See FIM for current functional status  Therapy/Group: Individual Therapy  Ramesses Crampton, Vara GuardianKayla N 12/05/2014, 10:30 AM

## 2014-12-05 NOTE — Progress Notes (Signed)
Physical Therapy Session Note  Patient Details  Name: [name] [name] MRN: [id number] Date of Birth: 11/21/1970  Today's Date: 12/05/2014 PT Individual Time: 0830-0930 PT Individual Time Calculation (min): 60 min   Short Term Goals: Week 1:  PT Short Term Goal 1 (Week 1): STGs=LTGs secondary to ELOS  Skilled Therapeutic Interventions/Progress Updates:    Pt received seated in w/c wearing L prosthetic device; agreeable to therapy. Session focused on activity tolerance, increasing functional strength/motor control in RLE, and functional transfers. Pt performed w/c mobility 2 x220' in controlled environment using RUE and LLE in R one arm drive w/c. Pt performed level squat pivot transfers from w/c <> mat table while adhering to weightbearing restrictions with supervision, subtle cueing for setup, w/c parts management. With supine<>sit on mat table, pt required min A for RLE management; encouraged pt to perform logroll technique to facilitate independence with supine > sit, as pt able to perform R hip flexion in gravity-eliminated position to advance RLE toward EOB. See below for description of therapeutic exercises focused on increasing functional strength, ROM, and motor control in RLE. Discussed car transfer and simulated SUV height. Simulated car transfer deferred at this time, as pt will likely need to perform stand pivot transfer and standing unsafe at this time due to poor fit of L prosthetic device. Session ended in pt room, where pt was left seated in w/c with all needs within reach.  Therapy Documentation Precautions:  Precautions Precautions: Fall Required Braces or Orthoses: Other Brace/Splint Other Brace/Splint: soft splint L UE Restrictions Weight Bearing Restrictions: Yes LUE Weight Bearing: Weight bear through elbow only RLE Weight Bearing: Non weight bearing Other Position/Activity Restrictions: L BKA with prosthesis Vital Signs: Therapy Vitals Temp: 97.7 F (36.5 C) Temp  Source: Oral Pulse Rate: 93 Resp: 19 BP: 117/66 mmHg Patient Position (if appropriate): Lying Oxygen Therapy SpO2: 100 % O2 Device: Not Delivered Pain: Pain Assessment Pain Assessment: No/denies pain Pain Score: 8  Pain Type: Acute pain Pain Location: Leg Pain Orientation: Right Pain Descriptors / Indicators: Aching Pain Frequency: Intermittent Pain Onset: With Activity Patients Stated Pain Goal: 5 Pain Intervention(s): RN made aware;Elevated extremity;Other (Comment) (RN pre-medicated prior to session) Locomotion : Ambulation Ambulation/Gait Assistance: 3: Mod assist;4: DentistMin assist Wheelchair Mobility Distance: 220  Exercises:  In supine, pt performed the following therapeutic exercises to fatigue: R hip/knee flexion/extension AAROM using maxi slide x15 reps, x10 reps with cueing for neutral R hip rotation; R quad sets x6 reps (limited by increased R knee pain);  R SAQ with manual assist x5 reps, x3 reps; R SLR (~25% full R hip flexion ROM) with manual assist 2 x3 reps with cueing focused on R quadriceps control.  See FIM for current functional status  Therapy/Group: Individual Therapy  Calvert CantorHobble, Calvin Chura A 12/05/2014, 4:33 PM
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
## 2014-12-05 NOTE — Progress Notes (Signed)
Social Work Patient ID: [name] [name], male   DOB: Feb 15, 1971, 44 y.o.   MRN: [id number]   CSW met with pt on 12-04-14 to update him on team conference discussion.  Pt was pleased to know of d/c date of 12-12-14.  He still plans to return to his home he shares with his wife.  Pt may be able to get a ramp or wife and stepson will need to learn how to bump pt up two steps in the w/c.  Pt was appreciative of the chance to talk with CSW again and asked CSW to visit again.  CSW also told pt that a neuropsychologist would be coming to see him on 12-05-14 and he was agreeable and looking forward to the visit.  CSW will continue to follow and assist as needed.

## 2014-12-05 NOTE — Progress Notes (Addendum)
Physical Therapy Session Note  Patient Details  Name: [name] [name] MRN: [id number] Date of Birth: 04/16/1971  Today's Date: 12/05/2014 PT Individual Time: 1100-1200 PT Individual Time Calculation (min): 60 min   Short Term Goals: Week 1:  PT Short Term Goal 1 (Week 1): STGs=LTGs secondary to ELOS  Skilled Therapeutic Interventions/Progress Updates:    Patient received sitting in wheelchair in family room. Session focused on wheelchair mobility, functional transfers, LE strengthening, wheelchair and equipment management. Wheelchair mobility >150' x2 with R UE and L LE (patient provided with new standard wheelchair, discharged use of R one arm drive due to L prosthetic improved fitting with new sock and liner). Squat pivot transfers with supervision wheelchair<>mat with decreased control of movement due to impulsivity; supervision level visual cues for wheelchair parts management for elevating leg rest and removal of leg rest.  New wheelchair provided to patient (discharged use of R one arm drive due to L prosthetic improved fitting with new sock and liner). Additionally, fitted with R elevating leg rest and added anti-tippers for safety. L PFRW assembled with multiple adjustments for improved efficiency, alignment, and comfort. Patient performed x2 sit<>stands from elevated mat to L PFRW with modA, patient demonstrating impulsivity and decreased safety with sequencing and hand placement during sit>stand. Transitioned to gait training x8' with L PFRW and min-modA. Noted decreased L hip/knee extension (prosthetic side), decreased step length; UEs fatigue quickly and patient with c/o pain on L BKA side due to "breaking prosthetic in". Encouraged patient to perform skin check of L residual limb after doffing prosthetic. Patient returned to room and left sitting in wheelchair with all needs within reach.  Therapy Documentation Precautions:  Precautions Precautions: Fall Required Braces or Orthoses:  Other Brace/Splint Other Brace/Splint: soft splint L UE Restrictions Weight Bearing Restrictions: Yes LUE Weight Bearing: Weight bear through elbow only RLE Weight Bearing: Non weight bearing Other Position/Activity Restrictions: L BKA with prosthesis Pain: Pain Assessment Pain Assessment: 0-10 Pain Score: 7  Pain Type: Acute pain Pain Location: Leg Pain Orientation: Right;Left Pain Descriptors / Indicators: Aching Pain Frequency: Intermittent Pain Onset: With Activity Patients Stated Pain Goal: 5 Pain Intervention(s): RN made aware;Repositioned;Ambulation/increased activity Multiple Pain Sites: No Locomotion : Ambulation Ambulation/Gait Assistance: 3: Mod assist;4: Min assist Wheelchair Mobility Distance: 150   See FIM for current functional status  Therapy/Group: Individual Therapy  [name] HerbBridget S [name] [name], PT, DPT 12/05/2014, 2:31 PM
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
## 2014-12-06 ENCOUNTER — Inpatient Hospital Stay (HOSPITAL_COMMUNITY): Payer: Federal, State, Local not specified - PPO | Admitting: *Deleted

## 2014-12-06 ENCOUNTER — Inpatient Hospital Stay (HOSPITAL_COMMUNITY): Payer: Federal, State, Local not specified - PPO

## 2014-12-06 LAB — GLUCOSE, CAPILLARY
GLUCOSE-CAPILLARY: 204 mg/dL — AB (ref 70–99)
Glucose-Capillary: 127 mg/dL — ABNORMAL HIGH (ref 70–99)
Glucose-Capillary: 129 mg/dL — ABNORMAL HIGH (ref 70–99)
Glucose-Capillary: 156 mg/dL — ABNORMAL HIGH (ref 70–99)

## 2014-12-06 NOTE — Plan of Care (Signed)
Problem: RH BOWEL ELIMINATION Goal: RH STG MANAGE BOWEL WITH ASSISTANCE STG Manage Bowel with Min Assistance.  Outcome: Not Progressing Patient to get fleets enema this evening if no BM Goal: RH STG MANAGE BOWEL W/MEDICATION W/ASSISTANCE STG Manage Bowel with Medication with MOD I Assistance.  Outcome: Not Progressing Will get fleets enema this evening if no BM
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
## 2014-12-06 NOTE — Plan of Care (Signed)
Problem: RH Ambulation Goal: LTG Patient will ambulate in controlled environment (PT) LTG: Patient will ambulate in a controlled environment, # of feet with assistance (PT).  Outcome: Not Applicable Date Met:  98/11/91 Goal discharged 12/06/14 secondary to increased pain in L residual limb (prosthesis) with standing activities. Goal: LTG Patient will ambulate in home environment (PT) LTG: Patient will ambulate in home environment, # of feet with assistance (PT).  Outcome: Not Applicable Date Met:  47/82/95 Goal discharged 12/06/14 secondary to increased pain in L residual limb (prosthesis) with standing activities.  Problem: RH Stairs Goal: LTG Patient will ambulate up and down stairs w/assist (PT) LTG: Patient will ambulate up and down # of stairs with assistance (PT)  Outcome: Not Applicable Date Met:  62/13/08 Goal discharged 12/06/14 secondary to increased pain in L residual limb (prosthesis) with standing activities.  Problem: RH Balance Goal: LTG Patient will maintain dynamic standing balance (PT) LTG: Patient will maintain dynamic standing balance with assistance during mobility activities (PT)  Outcome: Not Applicable Date Met:  65/78/46 Goal discharged 12/06/14 secondary to increased pain in L residual limb (prosthesis) with standing activities.

## 2014-12-06 NOTE — Progress Notes (Signed)
Occupational Therapy Session Note  Patient Details  Name: [name] [name] MRN: [id number] Date of Birth: 06/11/1971  Today's Date: 12/06/2014 OT Individual Time: 0700-0800 OT Individual Time Calculation (min): 60 min    Short Term Goals: Week 1:  OT Short Term Goal 1 (Week 1): [facility] with mod cues OT Short Term Goal 2 (Week 1): Pt will complete LB dressing with min assist and use of AE, prn OT Short Term Goal 3 (Week 1): Pt will toileting task at supervision level with min cues OT Short Term Goal 4 (Week 1): Pt will adhere to weight bearing precautions during 45-60 min session with min cues   Skilled Therapeutic Interventions/Progress Updates:    Pt seen for ADL retraining with focus on adherence to precautions, standing balance, functional transfers, and activity tolerance. Pt received supine in bed. Required min A supine>sit to manage RLE then mod A stand pivot transfer bed>w/c with PFRW. Pt demonstrates decreased hip and L knee extension with prosthesis. Completed squat pivot transfer w/c>TTB at supervision level. Completed bathing at shower level with setup assist and use of lateral leans. Completed sit<>stand from TTB (elevated surface) at min-mod A 2x. Therapist provided more assist with LB dressing this AM secondary to focus on sit<>stand and standing balance. Pt completed stand pivot transfer TTB>w/c at min A level using PFRW. Pt propelled self back to room and left with RN present and all needs in reach.   Therapy Documentation Precautions:  Precautions Precautions: Fall Required Braces or Orthoses: Other Brace/Splint Other Brace/Splint: soft splint L UE Restrictions Weight Bearing Restrictions: Yes LUE Weight Bearing: Non weight bearing (through wrist) RLE Weight Bearing: Non weight bearing Other Position/Activity Restrictions: L BKA with prosthesis General:   Vital Signs: Therapy Vitals Temp: 98.4 F (36.9 C) Temp Source:  Oral Pulse Rate: 79 Resp: 18 BP: 111/81 mmHg Patient Position (if appropriate): Lying Oxygen Therapy SpO2: 100 % O2 Device: Not Delivered Pain: Pain Assessment Pain Assessment: 0-10 Pain Score: 7  Pain Type: Acute pain Pain Location: Leg Pain Orientation: Right Pain Descriptors / Indicators: Aching Pain Frequency: Intermittent Pain Onset: With Activity Patients Stated Pain Goal: 5 Pain Intervention(s): Medication (See eMAR) ADL:   Exercises:   Other Treatments:    See FIM for current functional status  Therapy/Group: Individual Therapy  Daneil Danerkinson, Luara Faye N 12/06/2014, 7:55 AM

## 2014-12-06 NOTE — Progress Notes (Signed)
Physical Therapy Session Note  Patient Details  Name: [name] [name] MRN: [id number] Date of Birth: 01/22/1971  Today's Date: 12/06/2014 PT Individual Time: 0800-0900 and 1100-1200 PT Individual Time Calculation (min): 60 min and 60 min  Short Term Goals: Week 1:  PT Short Term Goal 1 (Week 1): STGs=LTGs secondary to ELOS  Skilled Therapeutic Interventions/Progress Updates:    First session: Patient received sitting in wheelchair. Session focused on discharge planning, functional transfers, wheelchair mobility, and wheelchair parts management. Wheelchair mobility >150' x2 with R UE and L LE (prosthesis) with supervision, cues for increased efficiency. Sit>stand attempted several times before success with modA and once standing, patient reporting increased pain in L residual limb with use of prosthesis. Discussion about discharge planning and goals and patient and therapist agreed upon patient goals being changed to w/c level due to too much pain in L residual limb with it being the only limb for weight bearing. POC adjusted accordingly.  Remainder of session focused on repeated wheelchair<>mat transfers with use of and set up of slideboard (per patient request) with supervision and emphasis on w/c parts management with supervision/min cues; repeated verbal cues to maintain weight bearing precautions for both L UE and R LE and for safe speed of movement. Patient returned to room and returned to bed, minA for R LE management into bed. Patient left with all needs within reach and bed alarm on.  Second session: Patient received supine in bed. Session focused on functional slideboard transfers, w/c parts management and set up for transfers, and bed mobility with emphasis on supine<>sit with and without use of leg lifter. Patient performing at overall supervision level for all tasks, including supine<>sit with use of leg lifter, continues to require minA for R LE lifting. Supine therex to improve R LE  strength for lifting in/out of bed: R quad sets with 5" hold, R ankle pumps, R hip abd and R heel slides (slideboard with pillow case to decrease friction) x20.   Patient returned to room and returned to bed, supervision for R LE management into bed with use of leg lifter. Patient left with all needs within reach and bed alarm on.  Therapy Documentation Precautions:  Precautions Precautions: Fall Required Braces or Orthoses: Other Brace/Splint Other Brace/Splint: soft splint L UE Restrictions Weight Bearing Restrictions: Yes LUE Weight Bearing: Non weight bearing (through wrist) RLE Weight Bearing: Non weight bearing Other Position/Activity Restrictions: L BKA with prosthesis Pain: Pain Assessment Pain Assessment: 0-10 Pain Score: 7  Pain Type: Acute pain Pain Location: Leg Pain Orientation: Right Pain Descriptors / Indicators: Aching Pain Frequency: Intermittent Pain Onset: With Activity Patients Stated Pain Goal: 5 Pain Intervention(s): RN made aware;Repositioned;Ambulation/increased activity Multiple Pain Sites: No Locomotion : Ambulation Ambulation/Gait Assistance: Not tested (comment) Wheelchair Mobility Distance: 150   See FIM for current functional status  Therapy/Group: Individual Therapy  Chipper HerbBridget S Lennard Capek S. Eulalia Ellerman, PT, DPT 12/06/2014, 10:33 AM

## 2014-12-06 NOTE — Plan of Care (Signed)
Problem: RH Balance Goal: LTG Patient will maintain dynamic standing with ADLs (OT) LTG: Patient will maintain dynamic standing balance with assist during activities of daily living (OT)  Outcome: Not Applicable Date Met:  11/01/22 Goal discharged 2/26 secondary to increased pain in L residual limb and prosthesis during standing activities.

## 2014-12-07 ENCOUNTER — Inpatient Hospital Stay (HOSPITAL_COMMUNITY): Payer: Self-pay

## 2014-12-07 ENCOUNTER — Inpatient Hospital Stay (HOSPITAL_COMMUNITY): Payer: Federal, State, Local not specified - PPO | Admitting: Occupational Therapy

## 2014-12-07 DIAGNOSIS — E1140 Type 2 diabetes mellitus with diabetic neuropathy, unspecified: Secondary | ICD-10-CM

## 2014-12-07 LAB — GLUCOSE, CAPILLARY
GLUCOSE-CAPILLARY: 123 mg/dL — AB (ref 70–99)
Glucose-Capillary: 162 mg/dL — ABNORMAL HIGH (ref 70–99)
Glucose-Capillary: 164 mg/dL — ABNORMAL HIGH (ref 70–99)
Glucose-Capillary: 168 mg/dL — ABNORMAL HIGH (ref 70–99)

## 2014-12-07 NOTE — Plan of Care (Signed)
Problem: RH PAIN MANAGEMENT Goal: RH STG PAIN MANAGED AT OR BELOW PT'S PAIN GOAL At or below level 8  Outcome: Not Progressing Patient on every 4 hours pain med prn
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
## 2014-12-07 NOTE — Progress Notes (Signed)
Occupational Therapy Session Note  Patient Details  Name: [name] [name] MRN: [id number] Date of Birth: 02/06/1971  Today's Date: 12/07/2014 OT Individual Time: 1100-1200 OT Individual Time Calculation (min): 60 min    Short Term Goals: Week 1:  OT Short Term Goal 1 (Week 1): [facility] mod cues OT Short Term Goal 2 (Week 1): Pt will complete LB dressing with min assist and use of AE, prn OT Short Term Goal 3 (Week 1): Pt will toileting task at supervision level with min cues OT Short Term Goal 4 (Week 1): Pt will adhere to weight bearing precautions during 45-60 min session with min cues   Skilled Therapeutic Interventions/Progress Updates:   Patient seen for occupational therapy this morning with emphasis on functional mobility transfer training, re-education of recall/adherence of precautions for RLE/LUE, postural control, and therapeutic activities and exercises.  Patient deferred ADL secondary to completing wash up prior to OT session.  Patient completes therapeutic activity tolerance task to self propel wheelchair from nurses station to hospital room, hospital room to therapy gym and therapy gym to hospital room using LLE prosthesis and RUE.  Patient with supervision to modified independence with wheelchair mobility.  Patient instructed in wheelchair setup next to mat and sliding board transfer training wheelchair to/from mat with min assist for RLE management during transfer.  Patient completes RUE therapeutic activity tolerance task to place/remove resistive clothespins on vertical rod with 2# cuff weight on RUE, followed by RUE therapeutic strengthening exercises of elbow/shoulder with above weight, 3 sets x 10 reps.  Rest breaks required secondary to decreased activity tolerance.  Patient completes transfer wheelchair to bed via sliding board min assist and verbal cue for adherence to NWB LUE wrist/hand.  Patient able to use leg lifter to lift RLE  into bed and position self in bed with supervision/verbal cues for adherence to precautions.  Patient re-educated on importance of adherence to WB precautions; patient receptive but will require reinforcement.  Needs in place and patient with bed alarm on.  RN in room upon discontinuation of session.  Therapy Documentation Precautions:  Precautions Precautions: Fall Required Braces or Orthoses: Other Brace/Splint Other Brace/Splint: soft splint L UE Restrictions Weight Bearing Restrictions: Yes LUE Weight Bearing: Non weight bearing RLE Weight Bearing: Non weight bearing Other Position/Activity Restrictions: L BKA with prosthesis Pain:  Patient reports pain level 7/10.  Premedicated with scheduled pain medications; RN administered medication at end of session.  See FIM for current functional status  Therapy/Group: Individual Therapy  Eber HongBrown, Shawny Borkowski L 12/07/2014, 12:01 PM

## 2014-12-08 ENCOUNTER — Inpatient Hospital Stay (HOSPITAL_COMMUNITY): Payer: Federal, State, Local not specified - PPO | Admitting: *Deleted

## 2014-12-08 ENCOUNTER — Encounter (HOSPITAL_COMMUNITY): Payer: Self-pay

## 2014-12-08 ENCOUNTER — Inpatient Hospital Stay (HOSPITAL_COMMUNITY): Payer: Self-pay

## 2014-12-08 LAB — GLUCOSE, CAPILLARY
Glucose-Capillary: 179 mg/dL — ABNORMAL HIGH (ref 70–99)
Glucose-Capillary: 199 mg/dL — ABNORMAL HIGH (ref 70–99)
Glucose-Capillary: 122 mg/dL — ABNORMAL HIGH (ref 70–99)
Glucose-Capillary: 173 mg/dL — ABNORMAL HIGH (ref 70–99)

## 2014-12-08 NOTE — Progress Notes (Addendum)
Physical Therapy Session Note  Patient Details  Name: [name] [name] MRN: [id number] Date of Birth: 12/27/1970  Today's Date: 12/08/2014 PT Individual Time: 0800-0900 PT Individual Time Calculation (min): 60 min   Short Term Goals: Week 1:  PT Short Term Goal 1 (Week 1): STGs=LTGs secondary to ELOS  Skilled Therapeutic Interventions/Progress Updates:    Patient received semi-reclined in bed. Patient dressed at bed level with assist only to lift R LE and thread pant leg. Session focused on bed mobility, functional transfers, and wheelchair mobility in home environment (ADL apartment). Patient performing at overall supervision level for all bed mobility (with use of leg lifter for managing R LE), bed<>wheelchair transfers with use of slideboard (hospital bed in room and standard bed in ADL apartment), wheelchair mobility in controlled (>150') and home environment (carpet and confined spaces in ADL apartment). Patient requires one cue for w/c parts management during set up for transfer and one cue to maintain NWB through L wrist.  Ramp negotiation x2 (ascends forwards during one trial and ascends backwards during one trial; descends forwards both trials) with close supervision, min A when turning w/c on narrow platform on top of ramp. Patient stating he will "always have someone with him" so practicing this is "not necessary." Seated R LAQ and hip flexion x20 to facilitate improved ability to lift R LE into/out of bed.  Patient becomes frustrated during session and argumentative when therapist facilitating challenges such as wheelchair<>couch transfer and uneven transfers, stating "I will never put myself in this position." Education provided about importance of uneven transfers even if patient does not plan to sit on lower surfaces in home. Additionally, educated patient the need to practice activities that are difficult and we cannot just perform activities that are easy for patient. Patient  verbalized understanding, but continues to argue about the need to attempt certain difficult activities. Patient left sitting in wheelchair with all needs within reach.  Therapy Documentation Precautions:  Precautions Precautions: Fall Required Braces or Orthoses: Other Brace/Splint Other Brace/Splint: soft splint L UE Restrictions Weight Bearing Restrictions: Yes LUE Weight Bearing: Weight bear through elbow only RLE Weight Bearing: Non weight bearing Other Position/Activity Restrictions: L BKA with prosthesis Pain: Pain Assessment Pain Assessment: 0-10 Pain Score: 7  Pain Type: Acute pain Pain Location: Leg Pain Orientation: Right Pain Descriptors / Indicators: Aching;Sore Pain Onset: On-going Pain Intervention(s): RN made aware;Repositioned;Ambulation/increased activity Multiple Pain Sites: No Locomotion : Ambulation Ambulation/Gait Assistance: Not tested (comment) Wheelchair Mobility Distance: 150   See FIM for current functional status  Therapy/Group: Individual Therapy  Chipper HerbBridget S Shatima Zalar S. Kenson Groh, PT, DPT 12/08/2014, 8:57 AM
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
## 2014-12-08 NOTE — Progress Notes (Signed)
Occupational Therapy Session Note  Patient Details  Name: [name] [name] MRN: [id number] Date of Birth: 01/25/1971  Today's Date: 12/08/2014 OT Individual Time: 0900-1000 and 1300-1400 OT Individual Time Calculation (min): 60 min and 60 min     Short Term Goals: Week 1:  OT Short Term Goal 1 (Week 1): [facility] with mod cues OT Short Term Goal 2 (Week 1): Pt will complete LB dressing with min assist and use of AE, prn OT Short Term Goal 3 (Week 1): Pt will toileting task at supervision level with min cues OT Short Term Goal 4 (Week 1): Pt will adhere to weight bearing precautions during 45-60 min session with min cues   Skilled Therapeutic Interventions/Progress Updates:    Session 1: Pt seen for ADL retraining with focus on functional transfers, lateral leans, use of AE, and safety awareness. Pt received sitting in w/c requesting to toilet. Pt completed squat pivot transfer w/c>toilet at supervision level. Pt utilized lateral leans for clothing management and hygiene during toileting. Pt retrieved clothing from drawers then completed SBT w/c>bed at supervision level with min cues for placement of board. Completed bathing and dressing sitting EOB at setup/supervision assist. Pt completed SBT bed>w/c then propelled self to laundry room to retrieve clothing from w/c level. Pt returned to room and completed SBT w/c>bed at supervision level. Pt utilized leg lifter with min cues to manage RLE during sit>supine. Pt left with all needs in reach.   Session 2: Pt seen for 1:1 OT session with focus on functional transfers, community re-entry, activity tolerance, and education. Pt received supine in bed agreeable to therapy. Pt sat EOB at supervision level with use of leg lifter then donned pants and prosthesis with increased time. Pt propelled self in w/c in community environment (on/off elevator, outside on sloped surfaces, and crowded restaurant environment) at  supervision level with focus on safety awareness and activity tolerance. Pt taken into subway restaurant and retrieved cup then prepared fountain drink with increased time. Pt required rest 3 rest breaks throughout community activity due to fatigue. Returned to unit and discussed DME. Practiced SBT w/c<>BSC (wide based and regular drop arm) at supervision level. Pt reporting feeling much more comfortable with wide based BSC and stated "the other one won't hold me" despite education on weight limit of BSC. Informed that therapist was unsure if insurance with cover wide based BSC, however therapist will follow-up with CSW. Pt returned to room and left sitting EOB with RN present to administer medication.   Therapy Documentation Precautions:  Precautions Precautions: Fall Required Braces or Orthoses: Other Brace/Splint Other Brace/Splint: soft splint L UE Restrictions Weight Bearing Restrictions: Yes LUE Weight Bearing: Weight bear through elbow only RLE Weight Bearing: Non weight bearing Other Position/Activity Restrictions: L BKA with prosthesis General:   Vital Signs:   Pain: Pain Assessment Pain Assessment: 0-10 Pain Score: 7  Pain Type: Acute pain Pain Location: Leg Pain Orientation: Right Pain Descriptors / Indicators: Aching;Sore Pain Onset: On-going Pain Intervention(s): RN made aware;Repositioned;Ambulation/increased activity Multiple Pain Sites: No  See FIM for current functional status  Therapy/Group: Individual Therapy  Daneil Danerkinson, Bryonna Sundby N 12/08/2014, 10:57 AM

## 2014-12-09 ENCOUNTER — Encounter (HOSPITAL_COMMUNITY): Payer: Self-pay

## 2014-12-09 ENCOUNTER — Inpatient Hospital Stay (HOSPITAL_COMMUNITY): Payer: Federal, State, Local not specified - PPO | Admitting: *Deleted

## 2014-12-09 ENCOUNTER — Inpatient Hospital Stay (HOSPITAL_COMMUNITY): Payer: Federal, State, Local not specified - PPO

## 2014-12-09 LAB — CREATININE, SERUM
Creatinine, Ser: 0.79 mg/dL (ref 0.50–1.35)
GFR calc non Af Amer: >90 mL/min (ref >90)

## 2014-12-09 LAB — GLUCOSE, CAPILLARY
GLUCOSE-CAPILLARY: 129 mg/dL — AB (ref 70–99)
GLUCOSE-CAPILLARY: 202 mg/dL — AB (ref 70–99)
Glucose-Capillary: 165 mg/dL — ABNORMAL HIGH (ref 70–99)
Glucose-Capillary: 156 mg/dL — ABNORMAL HIGH (ref 70–99)

## 2014-12-09 NOTE — Progress Notes (Signed)
Physical Therapy Session Note  Patient Details  Name: [name] [name] MRN: [id number] Date of Birth: 03/31/1971  Today's Date: 12/09/2014 PT Individual Time: 0800-0900 and 1000-1100 PT Individual Time Calculation (min): 60 min and 60 min  Short Term Goals: Week 1:  PT Short Term Goal 1 (Week 1): STGs=LTGs secondary to ELOS  Skilled Therapeutic Interventions/Progress Updates:    First session: Patient received sitting in wheelchair. Session focused on wheelchair mobility, functional transfers, and LE strengthening. Patient performed wheelchair mobility >150' x2 in controlled environment with R UE and L LE (prosthetic side) with mod I. Functional transfers to uneven heights (inclines/declines) with slideboard and supervision secondary to decreased balance due to uneven heights. Sitting/supine LE therex with emphasis on improved ability to lift R LE into bed: R knee LAQ (decreased range), R hip flex, R ankle pumps, R quad sets with 5" hold, R heel slides with pillow case and slideboard, R clam shells, 2x10. Patient returned to room and left sitting in wheelchair with all needs within reach.  Second session: Patient received sitting in wheelchair. Session focused on wheelchair mobility, car transfers, and LE strengthening. Wheelchair mobility >200' x2 with mod I in controlled environment environment with R UE and L LE (prosthetic side). Wheelchair<>simulated x3 car transfer of midsize SUV height with slideboard and close supervision. Patient initially impulsively performing transfer without therapist instruction by pulling on overhead bar with R UE with significantly decreased control and poor safety. After instruction, patient able to perform slideboard transfer wheelchair<>simulated car with close supervision/stabilization of wheelchair and use of leg lifter to manage R LE into/out of car. Seated in wheelchair: hip add ball squeeze with 3" hold and hip abd with green theraband, 2x10. Patient returned to  room and left seated in wheelchair.  Therapy Documentation Precautions:  Precautions Precautions: Fall Required Braces or Orthoses: Other Brace/Splint Other Brace/Splint: soft splint L UE Restrictions Weight Bearing Restrictions: Yes LUE Weight Bearing: Weight bear through elbow only RLE Weight Bearing: Non weight bearing Other Position/Activity Restrictions: L BKA with prosthesis Pain: Pain Assessment Pain Assessment: 0-10 Pain Score: 8  Pain Type: Acute pain Pain Location: Leg Pain Orientation: Right Pain Descriptors / Indicators: Aching;Sore Pain Onset: On-going Pain Intervention(s): RN made aware;Repositioned;Ambulation/increased activity Multiple Pain Sites: No Locomotion : Ambulation Ambulation/Gait Assistance: Not tested (comment) Wheelchair Mobility Distance: 150   See FIM for current functional status  Therapy/Group: Individual Therapy  Chipper HerbBridget S Jyssica Rief S. Lenna Hagarty, PT, DPT 12/09/2014, 8:32 AM

## 2014-12-09 NOTE — Progress Notes (Signed)
Occupational Therapy Session Note  Patient Details  Name: [name] [name] MRN: [id number] Date of Birth: 04/01/1971  Today's Date: 12/09/2014 OT Individual Time: 0700-0800 OT Individual Time Calculation (min): 60 min    Short Term Goals: Week 1:  OT Short Term Goal 1 (Week 1): [facility] with mod cues OT Short Term Goal 2 (Week 1): Pt will complete LB dressing with min assist and use of AE, prn OT Short Term Goal 3 (Week 1): Pt will toileting task at supervision level with min cues OT Short Term Goal 4 (Week 1): Pt will adhere to weight bearing precautions during 45-60 min session with min cues   Skilled Therapeutic Interventions/Progress Updates:    Pt seen for ADL retraining with focus on functional transfers, lateral leans, safety, and activity tolerance. Pt received supine in bed. Completed bathing and dressing sitting EOB with setup assist and supervision cues for safety. Pt donned prosthesis then completed SBT bed>w/c with min cues for placement of board. Pt retrieved clothing items around room then propelled self to laundry room. Completed laundry at mod I level then propelled self to family room to prepare coffee. Returned to room and practiced SBT w/c<>BSC at supervision level. Discussed energy conservation techniques and home setup at home with pt reporting no questions or concerns. At end of session, pt left sitting in w/c with all needs in reach.  Therapy Documentation Precautions:  Precautions Precautions: Fall Required Braces or Orthoses: Other Brace/Splint Other Brace/Splint: soft splint L UE Restrictions Weight Bearing Restrictions: Yes LUE Weight Bearing: Non weight bearing RLE Weight Bearing: Non weight bearing Other Position/Activity Restrictions: L BKA with prosthesis General:   Vital Signs: Therapy Vitals Temp: 98.3 F (36.8 C) Temp Source: Oral Pulse Rate: 86 Resp: 19 BP: (!) 100/56 mmHg Patient Position (if  appropriate): Lying Oxygen Therapy SpO2: 97 % O2 Device: Not Delivered Pain: Pain Assessment Pain Assessment: 0-10 Pain Score: 7   See FIM for current functional status  Therapy/Group: Individual Therapy  Daneil Danerkinson, Krysti Hickling N 12/09/2014, 7:59 AM
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
## 2014-12-10 ENCOUNTER — Inpatient Hospital Stay (HOSPITAL_COMMUNITY): Payer: Federal, State, Local not specified - PPO

## 2014-12-10 ENCOUNTER — Inpatient Hospital Stay (HOSPITAL_COMMUNITY): Payer: Federal, State, Local not specified - PPO | Admitting: *Deleted

## 2014-12-10 LAB — GLUCOSE, CAPILLARY
GLUCOSE-CAPILLARY: 179 mg/dL — AB (ref 70–99)
Glucose-Capillary: 190 mg/dL — ABNORMAL HIGH (ref 70–99)
Glucose-Capillary: 146 mg/dL — ABNORMAL HIGH (ref 70–99)
Glucose-Capillary: 194 mg/dL — ABNORMAL HIGH (ref 70–99)
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
## 2014-12-10 NOTE — Progress Notes (Signed)
Occupational Therapy Session Note  Patient Details  Name: [name] [name] MRN: [id number] Date of Birth: 02/04/1971  Today's Date: 12/10/2014 OT Individual Time: 0700-0800 and 1100-1200 OT Individual Time Calculation (min): 60 min and 60 min     Short Term Goals: Week 1:  OT Short Term Goal 1 (Week 1): [facility] with mod cues OT Short Term Goal 2 (Week 1): Pt will complete LB dressing with min assist and use of AE, prn OT Short Term Goal 3 (Week 1): Pt will toileting task at supervision level with min cues OT Short Term Goal 4 (Week 1): Pt will adhere to weight bearing precautions during 45-60 min session with min cues   Skilled Therapeutic Interventions/Progress Updates:    Session 1: Pt seen for ADL retraining with focus on functional transfers, lateral leans, safety awareness, and discharge planning. Pt received supine in bed requesting to sponge bathe this AM due to increased pain and "not sleeping well." Pt completed SBT bed>w/c at supervision level with cue for placement of board. Pt retrieved bathing and dressing items then returned to bed. Completed bathing and dressing at supervision-Mod I level, using lateral leans for buttocks hygiene and LB dressing. Pt completed oral care from w/c level at sink. Pt propelled self room>family room to retrieve coffee at Mod I level for task. Discussed practicing toilet transfer with wife present initially to ensure w/c is able to safely fit into bathroom, otherwise discussed positioning of BSC in room. Pt with no questions or concerns about discharge at this time. Pt left sitting in w/c with all needs in reach.   Session 2: Pt seen for 1:1 OT session with focus on RUE strengthening, activity tolerance, and w/c mobility. Pt received sitting in w/c. Propelled self to therapy gym and engaged in RUE strengthening exercises using 5# dumbbell completing 4 sets x 15 reps of bicep, tricep, and shoulder exercises. Pt  propelled self in community environment (on/off elevators and sloped surface outside) 300+ feet total with multiple rest breaks due to fatigue. Continued discussion in regards to discharge planning and home setup. At end of session pt returned to room and left sitting EOB with all needs in reach.   Therapy Documentation Precautions:  Precautions Precautions: Fall Required Braces or Orthoses: Other Brace/Splint Other Brace/Splint: soft splint L UE Restrictions Weight Bearing Restrictions: Yes LUE Weight Bearing: Non weight bearing (through wrist) RLE Weight Bearing: Non weight bearing Other Position/Activity Restrictions: L BKA with prosthesis General:   Vital Signs: Therapy Vitals Temp: 97.9 F (36.6 C) Temp Source: Oral Pulse Rate: 79 Resp: 20 BP: 122/73 mmHg Oxygen Therapy SpO2: 99 % O2 Device: Not Delivered Pain: 8/10 pain in RLE  See FIM for current functional status  Therapy/Group: Individual Therapy  Daneil Danerkinson, Brunella Wileman N 12/10/2014, 7:40 AM

## 2014-12-10 NOTE — Progress Notes (Signed)
Contacts made to orthopedic services Dr. Eulah PontMurphy in regards to splint to left upper extremity. Continue present splint for now and patient will follow-up in the office in regards to any changes that need to be made
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
## 2014-12-11 ENCOUNTER — Inpatient Hospital Stay (HOSPITAL_COMMUNITY): Payer: Federal, State, Local not specified - PPO

## 2014-12-11 ENCOUNTER — Inpatient Hospital Stay (HOSPITAL_COMMUNITY): Payer: Federal, State, Local not specified - PPO | Admitting: *Deleted

## 2014-12-11 ENCOUNTER — Inpatient Hospital Stay (HOSPITAL_COMMUNITY): Payer: Self-pay

## 2014-12-11 LAB — GLUCOSE, CAPILLARY
GLUCOSE-CAPILLARY: 138 mg/dL — AB (ref 70–99)
GLUCOSE-CAPILLARY: 172 mg/dL — AB (ref 70–99)
Glucose-Capillary: 183 mg/dL — ABNORMAL HIGH (ref 70–99)
Glucose-Capillary: 147 mg/dL — ABNORMAL HIGH (ref 70–99)
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
## 2014-12-11 NOTE — Progress Notes (Signed)
Occupational Therapy Session Note  Patient Details  Name: [name] [name] MRN: [id number] Date of Birth: 01/03/1971  Today's Date: 12/11/2014 OT Individual Time: 0900-1000 OT Individual Time Calculation (min): 60 min    Short Term Goals: Week 1:  OT Short Term Goal 1 (Week 1): Pt [facility] supervision level with mod cues OT Short Term Goal 2 (Week 1): Pt will complete LB dressing with min assist and use of AE, prn OT Short Term Goal 3 (Week 1): Pt will toileting task at supervision level with min cues OT Short Term Goal 4 (Week 1): Pt will adhere to weight bearing precautions during 45-60 min session with min cues   Skilled Therapeutic Interventions/Progress Updates:    Pt seen for ADL retraining with focus on functional transfers, safety awareness, use of AE, and activity tolerance. Pt received sitting in w/c and had retrieved all bathing and dressing needs prior to therapist arrival. Pt propelled self to ADL apartment and completed bathing at shower level at Mod I. Pt utilized reacher with increased time for LB dressing. Pt propelled self back to room to retrieve laundry then proceeded to complete laundry at Mod I level. Discussed setup of home for sponge baths and dressing with pt somewhat argumentative with therapists suggestions. At end of session pt returned to room and left with all needs in reach. Pt with no questions at this time.   Therapy Documentation Precautions:  Precautions Precautions: Fall Required Braces or Orthoses: Other Brace/Splint Other Brace/Splint: soft splint L UE Restrictions Weight Bearing Restrictions: Yes LUE Weight Bearing: Weight bear through elbow only RLE Weight Bearing: Non weight bearing Other Position/Activity Restrictions: L BKA with prosthesis General:   Vital Signs:   Pain: Pain Assessment Pain Assessment: 0-10 Pain Score: 8  Pain Type: Acute pain Pain Location: Leg Pain Orientation: Right Pain Descriptors / Indicators:  Aching Pain Frequency: Intermittent Pain Onset: On-going Patients Stated Pain Goal: 5 Pain Intervention(s): RN made aware;Repositioned;Ambulation/increased activity Multiple Pain Sites: No  See FIM for current functional status  Therapy/Group: Individual Therapy  Daneil Danerkinson, Nadja Lina N 12/11/2014, 10:35 AM

## 2014-12-11 NOTE — Progress Notes (Signed)
Physical Therapy Session Note  Patient Details  Name: [name] [name] MRN: [id number] Date of Birth: 06/08/1971  Today's Date: 12/11/2014 PT Individual Time: 1300-1400 PT Individual Time Calculation (min): 60 min   Short Term Goals: Week 1:  PT Short Term Goal 1 (Week 1): STGs=LTGs secondary to ELOS    Skilled Therapeutic Interventions/Progress Updates:  W/c mobility x 150' x 2 using LLE and RUE, modified independent.   Unlevel transfer 2" uphill and downhill, using SB w/c>< mat to L and R, supervision witout cues.  Dynamic sitting balance with L prosthetic foot supported> unsupported, NWBing RLE during:  - reaching out of BOS L and R while wt shifting to facilitate pelvic dissociation  -ball toss/catch with bil hands overhead to facilitate trunk extension - "nudging" beach ball on floor with R foot to facilitate ankle DF activation    Pt's RLE sutures had recently been removed, and there was some weeping of suture sites; controlled with 4/4; returned to room.  W/c> bed SBT modified independent.     RN arrrived to attend to pt's RLE. Therapy Documentation Precautions:  Precautions Precautions: Fall Required Braces or Orthoses: Other Brace/Splint Other Brace/Splint: soft splint L UE Restrictions Weight Bearing Restrictions: Yes LUE Weight Bearing: Non weight bearing RLE Weight Bearing: Non weight bearing Other Position/Activity Restrictions: L BKA with prosthesis   Pain: Pain Assessment Pain Assessment: 0-10 Pain Score: 7  Pain Type: Acute pain Pain Location: Back (also RLE, L hand) Pain Orientation: Lower;Right Pain Descriptors / Indicators: Aching Pain Frequency: Intermittent Pain Onset: With Activity Patients Stated Pain Goal: 5 Pain Intervention(s): Medication (See eMAR)    See FIM for current functional status  Therapy/Group: Individual Therapy  Josilyn Shippee 12/11/2014, 4:29 PM
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
## 2014-12-11 NOTE — Progress Notes (Signed)
Occupational Therapy Discharge Summary  Patient Details  Name: [name] [name] MRN: [id number] Date of Birth: 22-Mar-1971  Today's Date: 12/11/2014   Patient has met 9 of 9 long term goals due to improved activity tolerance, improved balance, postural control, ability to compensate for deficits and improved coordination.  Patient to discharge at overall Modified Independent level.  Patient's care partner is not necessary to provide the necessary assistance at discharge secondary to patient discharging at Modified Independent level. Patient is currently modified independent in room to better prepare for discharge home.    Reasons goals not met: N/A. All LTGs met  Recommendation:  No skilled occupational therapy recommended at this time.   Equipment: drop arm BSC, sliding board  Reasons for discharge: treatment goals met and discharge from hospital  Patient/family agrees with progress made and goals achieved: Yes  OT Discharge Precautions/Restrictions  Precautions Precautions: Fall Required Braces or Orthoses: Other Brace/Splint Other Brace/Splint: soft splint L UE Restrictions Weight Bearing Restrictions: Yes LUE Weight Bearing: Weight bear through elbow only RLE Weight Bearing: Non weight bearing Other Position/Activity Restrictions: L BKA with prosthesis General   Vital Signs   Pain Pain Assessment Pain Assessment: 0-10 Pain Score: 8  Pain Type: Acute pain Pain Location: Leg Pain Orientation: Right Pain Descriptors / Indicators: Aching Pain Frequency: Intermittent Pain Onset: On-going Patients Stated Pain Goal: 5 Pain Intervention(s): RN made aware;Repositioned;Ambulation/increased activity Multiple Pain Sites: No ADL   Vision/Perception  Vision- History Baseline Vision/History: Wears glasses Wears Glasses: Distance only Patient Visual Report: No change from baseline Vision- Assessment Vision Assessment?: Yes Eye Alignment: Within Functional Limits Ocular  Range of Motion: Within Functional Limits Alignment/Gaze Preference: Within Defined Limits Tracking/Visual Pursuits: Able to track stimulus in all quads without difficulty Saccades: Within functional limits Convergence: Within functional limits Visual Fields: No apparent deficits  Cognition Overall Cognitive Status: Within Functional Limits for tasks assessed Arousal/Alertness: Awake/alert Orientation Level: Oriented X4 Attention: Selective Selective Attention: Appears intact Memory: Appears intact Awareness: Appears intact Problem Solving: Appears intact Safety/Judgment: Appears intact Sensation Sensation Light Touch: Appears Intact Hot/Cold: Appears Intact Proprioception: Appears Intact Coordination Gross Motor Movements are Fluid and Coordinated: Yes Fine Motor Movements are Fluid and Coordinated: Yes Motor  Motor Motor: Within Functional Limits Mobility  Bed Mobility Bed Mobility: Supine to Sit;Sit to Supine Supine to Sit: 6: Modified independent (Device/Increase time);HOB flat Sit to Supine: 6: Modified independent (Device/Increase time);HOB flat  Trunk/Postural Assessment  Cervical Assessment Cervical Assessment: Within Functional Limits Thoracic Assessment Thoracic Assessment: Within Functional Limits Lumbar Assessment Lumbar Assessment: Within Functional Limits Postural Control Postural Control: Within Functional Limits  Balance Balance Balance Assessed: Yes Static Sitting Balance Static Sitting - Balance Support: Feet supported;No upper extremity supported;Feet unsupported;Right upper extremity supported Static Sitting - Level of Assistance: 6: Modified independent (Device/Increase time) Dynamic Sitting Balance Dynamic Sitting - Balance Support: Feet unsupported;No upper extremity supported;During functional activity;Feet supported;Right upper extremity supported Dynamic Sitting - Level of Assistance: 6: Modified independent (Device/Increase  time) Extremity/Trunk Assessment RUE Assessment RUE Assessment: Within Functional Limits LUE Assessment LUE Assessment: Exceptions to [facility] (AROM at elbow and shoulder WFL; limited at wrist due to splint)  See FIM for current functional status  Dalaysia Harms, Quillian Quince 12/11/2014, 10:49 AM

## 2014-12-11 NOTE — Discharge Summary (Signed)
Discharge summary job [id number]

## 2014-12-11 NOTE — Plan of Care (Signed)
Problem: RH Car Transfers Goal: LTG Patient will perform car transfers with assist (PT) LTG: Patient will perform car transfers with assistance (PT).  Outcome: Not Applicable Date Met:  08/31/61 Patient completed simulated car transfer with slieboard and supervision, however, has opted to transport home via ambulance and therefore, goal not applicable.  Problem: RH Other (Specify) Goal: RH LTG Other (Specify) Outcome: Not Applicable Date Met:  44/69/50 Patient declined family education with wife for bumping patient up steps to enter home. Patient has opted for ambulance transfer home. Patient has been educated about importance of family training to be performed for home access, but has repeatedly declined and will follow up with HHPT for home access.  Problem: RH Other (Specify) Goal: RH LTG Other (Specify) Outcome: Not Applicable Date Met:  72/25/75 Patient declined family education with wife for bumping patient up steps to enter home. Patient has opted for ambulance transfer home. Patient has been educated about importance of family training to be performed for home access, but has repeatedly declined and will follow up with HHPT for home access.
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
## 2014-12-12 LAB — GLUCOSE, CAPILLARY: Glucose-Capillary: 197 mg/dL — ABNORMAL HIGH (ref 70–99)

## 2014-12-12 MED ORDER — METFORMIN HCL 1000 MG PO TABS: 1000.0000 mg | ORAL_TABLET | Freq: Every day | ORAL | Status: DC

## 2014-12-12 MED ORDER — PANTOPRAZOLE SODIUM 40 MG PO TBEC: 40.0000 mg | DELAYED_RELEASE_TABLET | Freq: Every day | ORAL | Status: DC

## 2014-12-12 MED ORDER — OXYCODONE HCL ER 20 MG PO T12A: 20.0000 mg | EXTENDED_RELEASE_TABLET | Freq: Two times a day (BID) | ORAL | Status: DC

## 2014-12-12 MED ORDER — DOCUSATE SODIUM 100 MG PO CAPS: 100.0000 mg | ORAL_CAPSULE | Freq: Two times a day (BID) | ORAL | Status: DC

## 2014-12-12 MED ORDER — FUROSEMIDE 40 MG PO TABS: 40.0000 mg | ORAL_TABLET | Freq: Every day | ORAL | Status: DC

## 2014-12-12 MED ORDER — INSULIN GLARGINE 100 UNIT/ML ~~LOC~~ SOLN: 30.0000 [IU] | Freq: Every day | SUBCUTANEOUS | Status: DC

## 2014-12-12 MED ORDER — ALPRAZOLAM 0.5 MG PO TABS: 0.5000 mg | ORAL_TABLET | Freq: Three times a day (TID) | ORAL | Status: DC | PRN

## 2014-12-12 MED ORDER — LISINOPRIL 40 MG PO TABS: 40.0000 mg | ORAL_TABLET | Freq: Every day | ORAL | Status: DC

## 2014-12-12 MED ORDER — TAMSULOSIN HCL 0.4 MG PO CAPS: 0.4000 mg | ORAL_CAPSULE | Freq: Every day | ORAL | Status: DC

## 2014-12-12 MED ORDER — OXYCODONE HCL 10 MG PO TABS: 10.0000 mg | ORAL_TABLET | ORAL | Status: DC | PRN

## 2014-12-12 MED ORDER — METHOCARBAMOL 500 MG PO TABS: 500.0000 mg | ORAL_TABLET | Freq: Four times a day (QID) | ORAL | Status: DC | PRN

## 2014-12-12 NOTE — Discharge Planning (Signed)
Patient discharged home in stable condition. Verbalizes understanding of all discharge instructions, including home medications and follow up appointments. 
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
## 2014-12-12 NOTE — Progress Notes (Signed)
Social Work Patient ID: [name] [name], male   DOB: November 09, 1970, 44 y.o.   MRN: [id number]   CSW met with pt to update him on team conference discussion on 12-10-14.  Pt is ready to go home and feels he will need to go home via ambulance.  Pt understands that insurance may not cover it and is still wanting to travel this way.  CSW arranged HH and ordered DME for pt.  He was very appreciative of CSW's assistance and hopes to recover at home and f/u with a counselor and make some changes for the better to his life.  CSW offered support and encouragement.

## 2014-12-12 NOTE — Discharge Instructions (Signed)
Inpatient Rehab Discharge Instructions  Retta Dionesnthony Halfmann Discharge date and time: No discharge date for patient encounter.   Activities/Precautions/ Functional Status: Activity: Nonweightbearing right lower extremity and nonweightbearing to left elbow Diet: Diabetic diet Wound Care: Keep wound clean and dry Functional status:  ___ No restrictions     ___ Walk up steps independently ___ 24/7 supervision/assistance   ___ Walk up steps with assistance ___ Intermittent supervision/assistance  ___ Bathe/dress independently ___ Walk with walker     ___ Bathe/dress with assistance ___ Walk Independently    ___ Shower independently _x_ Walk with assistance    ___ Shower with assistance ___ No alcohol     ___ Return to work/school ________  COMMUNITY REFERRALS UPON DISCHARGE:   Home Health:   PT     RN     SW    Agency:  [facility] Phone:  [phone] Medical Equipment/Items Ordered:  18x18 lightweight w/c with elevating leg rests; 18x18 basic cushion; drop arm commode; 30" slide board  Agency/Supplier:  [facility]     Phone:  [phone]  GENERAL COMMUNITY RESOURCES FOR PATIENT/FAMILY: Mental Health:  Follow up with counselor on list given to you by your insurance case manager.  Special Instructions:    My questions have been answered and I understand these instructions. I will adhere to these goals and the provided educational materials after my discharge from the hospital.  Patient/Caregiver Signature _______________________________ Date __________  Clinician Signature _______________________________________ Date __________  Please bring this form and your medication list with you to all your follow-up doctor's appointments.
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
## 2014-12-13 NOTE — Progress Notes (Signed)
Social Work Discharge Note  The overall goal for the admission was met for:   Discharge location: Yes - home  Length of Stay: Yes - 10 days  Discharge activity level: Yes - modified independent  Home/community participation: Yes  Services provided included: MD, RD, PT, OT, RN, Pharmacy and SW  Financial Services: Medicare and Private Insurance: [employer]  Follow-up services arranged: Home Health: PT/SW and DME: 18x18 w/c with basic cushion; 30" slide board; drop arm commode  Comments (or additional information): Pt to return home with his wife and step-son.  Pt d/c'd to home via [facility] at his request.  He realizes insurance may or may not cover transport.  Patient/Family verbalized understanding of follow-up arrangements: Yes  Individual responsible for coordination of the follow-up plan: pt with some assistance from his wife  Confirmed correct DME delivered: Trey Sailors 12/13/2014    [name] [name], [name] [name]

## 2014-12-19 ENCOUNTER — Other Ambulatory Visit: Payer: Self-pay | Admitting: *Deleted

## 2014-12-19 NOTE — Telephone Encounter (Signed)
Mr [name] [name] has called asking for a refill on his oxycodone 10 mg tablets.  Dan wrote the rx for taking 1-2 q 4 hours #90 and Mr [name] [name] has stated he only gave him 8 days of meds as he IS TAKING  20 mg Q 4 hours.  You have an appt tomorrow we could put him in but he is saying there is no way he can come in because he does not have a ramp yet and cannot leave until it is in place.He said that "Dr Riley KillSwartz wants me to cut back on the long acting oxycontin but I am having to use the short acting q 4.  I suggested that more likely Dr Riley KillSwartz is expecting you to cut back on short acting as it is written for as needed only and the long acting are scheduled doses twice a day.   I have explained to him that we do not wrote for narcotics to be released without a visit and he understands but he keeps reiterating that [name] said all he had to do was call us to get a refill. He can make it through the weekend but will be out after.  He filled his rx 12/12/14.(also his oxycontin was written 20 mg bid but he wrote for #90(??) instead of 60.  Please advise.

## 2014-12-20 ENCOUNTER — Inpatient Hospital Stay: Payer: Self-pay | Admitting: Physical Medicine & Rehabilitation

## 2014-12-20 MED ORDER — OXYCODONE HCL 10 MG PO TABS: ORAL_TABLET | ORAL | Status: DC

## 2014-12-20 NOTE — Telephone Encounter (Signed)
I will write him another rx for the 10mg  oxycodone which will be written 10mg  q4-6 hr PRN--#120. Someone obviously will need to come and pick up.  There were long discussions about tapering all meds. He has poor coping skills and is impulsive (see history).

## 2014-12-20 NOTE — Telephone Encounter (Signed)
Mr [name] called back.  Dr Riley KillSwartz talked to him and instructed him to cut back on his meds and this is a one time pick up only.  I told him it must be picked up during office hours Monday and that we would be sending home a CSA with it for him to read and sign and bring back to his appt 01/28/15.  Additional instructions will be placed with the rx so that he fully understands he cannot take meds q 4 hours except on few occasions and must adjust doses other days to avoid being without meds. Addendum: Dr Riley KillSwartz spoke with him.  We are giving him the rx and a letter with expectations for the appt which has been changed to 01/21/15. He will have the CSA to sign as well as all paperwork for the appt which should be signed and filled out and brought to his appt.

## 2014-12-20 NOTE — Telephone Encounter (Signed)
Left Vm for Mr [name] [name] to call our office so that I may review with him this rx and to make him understand it cannot be taken every 4 hours or he will run out early and will be without.  It may be taken every 6 hours (4 per day) and on occasion if needed he can take 5 but will have to take less on another day.  This is a one time allowance to pick up rx and will be given a CSA to sign and bring back to visit with Dr Riley KillSwartz.  This rx must last 30 days.

## 2014-12-23 ENCOUNTER — Telehealth: Payer: Self-pay | Admitting: *Deleted

## 2014-12-23 NOTE — Telephone Encounter (Signed)
error 

## 2014-12-27 DIAGNOSIS — S82121D Displaced fracture of lateral condyle of right tibia, subsequent encounter for closed fracture with routine healing: Secondary | ICD-10-CM | POA: Diagnosis not present

## 2014-12-27 DIAGNOSIS — I10 Essential (primary) hypertension: Secondary | ICD-10-CM | POA: Diagnosis not present

## 2014-12-27 DIAGNOSIS — S52512D Displaced fracture of left radial styloid process, subsequent encounter for closed fracture with routine healing: Secondary | ICD-10-CM | POA: Diagnosis not present

## 2014-12-27 DIAGNOSIS — X72XXXD Intentional self-harm by handgun discharge, subsequent encounter: Secondary | ICD-10-CM

## 2014-12-27 DIAGNOSIS — Z89512 Acquired absence of left leg below knee: Secondary | ICD-10-CM

## 2014-12-27 DIAGNOSIS — S81831D Puncture wound without foreign body, right lower leg, subsequent encounter: Secondary | ICD-10-CM

## 2014-12-27 DIAGNOSIS — E1142 Type 2 diabetes mellitus with diabetic polyneuropathy: Secondary | ICD-10-CM | POA: Diagnosis not present

## 2015-01-21 ENCOUNTER — Encounter: Payer: Self-pay | Admitting: Physical Medicine & Rehabilitation

## 2015-01-21 ENCOUNTER — Encounter
Payer: Federal, State, Local not specified - PPO | Attending: Physical Medicine & Rehabilitation | Admitting: Physical Medicine & Rehabilitation

## 2015-01-21 ENCOUNTER — Other Ambulatory Visit: Payer: Self-pay | Admitting: Physical Medicine & Rehabilitation

## 2015-01-21 VITALS — BP 157/88 | HR 109 | Resp 16 | Ht 70.0 in

## 2015-01-21 DIAGNOSIS — T79A21S Traumatic compartment syndrome of right lower extremity, sequela: Secondary | ICD-10-CM

## 2015-01-21 DIAGNOSIS — Z79899 Other long term (current) drug therapy: Secondary | ICD-10-CM

## 2015-01-21 DIAGNOSIS — Z5181 Encounter for therapeutic drug level monitoring: Secondary | ICD-10-CM

## 2015-01-21 DIAGNOSIS — Z89512 Acquired absence of left leg below knee: Secondary | ICD-10-CM | POA: Diagnosis not present

## 2015-01-21 DIAGNOSIS — T1491 Suicide attempt: Secondary | ICD-10-CM | POA: Insufficient documentation

## 2015-01-21 DIAGNOSIS — G894 Chronic pain syndrome: Secondary | ICD-10-CM | POA: Diagnosis not present

## 2015-01-21 DIAGNOSIS — S82141S Displaced bicondylar fracture of right tibia, sequela: Secondary | ICD-10-CM | POA: Diagnosis not present

## 2015-01-21 DIAGNOSIS — T1491XA Suicide attempt, initial encounter: Secondary | ICD-10-CM

## 2015-01-21 MED ORDER — OXYCODONE HCL 10 MG PO TABS: ORAL_TABLET | ORAL | Status: DC

## 2015-01-21 MED ORDER — MORPHINE SULFATE ER 30 MG PO TBCR: 30.0000 mg | EXTENDED_RELEASE_TABLET | Freq: Two times a day (BID) | ORAL | Status: DC
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
## 2015-01-24 LAB — PMP ALCOHOL METABOLITE (ETG): Ethyl Glucuronide (EtG): NEGATIVE ng/mL

## 2015-01-28 ENCOUNTER — Inpatient Hospital Stay: Payer: Self-pay | Admitting: Physical Medicine & Rehabilitation

## 2015-01-28 LAB — PRESCRIPTION MONITORING PROFILE (SOLSTAS)
Amphetamine/Meth: NEGATIVE ng/mL
Barbiturate Screen, Urine: NEGATIVE ng/mL
Benzodiazepine Screen, Urine: NEGATIVE ng/mL
Buprenorphine, Urine: NEGATIVE ng/mL
Cannabinoid Scrn, Ur: NEGATIVE ng/mL
Carisoprodol, Urine: NEGATIVE ng/mL
Cocaine Metabolites: NEGATIVE ng/mL
Creatinine, Urine: 32.31 mg/dL (ref >20.0)
ECSTASY: NEGATIVE ng/mL
FENTANYL URINE: NEGATIVE ng/mL
MEPERIDINE UR: NEGATIVE ng/mL
METHADONE SCREEN, URINE: NEGATIVE ng/mL
Nitrites, Initial: NEGATIVE ug/mL
PH URINE, INITIAL: 5.2 pH (ref 4.5–8.9)
Propoxyphene: NEGATIVE ng/mL
TRAMADOL UR: NEGATIVE ng/mL
Tapentadol, urine: NEGATIVE ng/mL
ZOLPIDEM, URINE: NEGATIVE ng/mL

## 2015-01-28 LAB — OPIATES/OPIOIDS (LC/MS-MS)
Codeine Urine: NEGATIVE ng/mL (ref <50)
HYDROMORPHONE: 106 ng/mL — AB (ref <50)
Hydrocodone: 361 ng/mL — AB (ref <50)
MORPHINE: NEGATIVE ng/mL (ref <50)
Norhydrocodone, Ur: 387 ng/mL — AB (ref <50)
Noroxycodone, Ur: 106 ng/mL (ref <50)
Oxycodone, ur: NEGATIVE ng/mL — AB (ref <50)
Oxymorphone: 128 ng/mL (ref <50)

## 2015-01-28 LAB — OXYCODONE, URINE (LC/MS-MS)
Noroxycodone, Ur: 106 ng/mL (ref <50)
OXYCODONE, UR: NEGATIVE ng/mL — AB (ref <50)
Oxymorphone: 128 ng/mL (ref <50)

## 2015-02-07 NOTE — Progress Notes (Addendum)
Urine drug screen for this encounter is inconsistent.  Presence of hydrocodone and metabolites present.  Per [facility] was prescribed hydrocodone by a physician prior to coming to see Dr Riley KillSwartz and had problems with cost of oxycontin so this may be consistent on that basis.He was a hospital follow-up and medication changed to morphine at this visit. Otherwise consistent.

## 2015-02-10 ENCOUNTER — Telehealth: Payer: Self-pay | Admitting: *Deleted

## 2015-02-10 NOTE — Telephone Encounter (Signed)
We have received a request from the pharmacy to refill diazepam for Mr [name] [name] which was written for him by Jesusita Okaan at discharge.  Your last note says that  He is to follow up with outpt psych. Do you want them to be the one to prescribe the benzo?

## 2015-02-10 NOTE — Telephone Encounter (Signed)
i will help him out with a refill this time, but i want psychiatry to be filling the valium in the future

## 2015-02-11 MED ORDER — ALPRAZOLAM 0.5 MG PO TABS: 0.5000 mg | ORAL_TABLET | Freq: Three times a day (TID) | ORAL | Status: DC | PRN

## 2015-02-11 NOTE — Telephone Encounter (Signed)
The refill request was actually for xanax and I will refill.

## 2015-02-17 ENCOUNTER — Telehealth: Payer: Self-pay | Admitting: *Deleted

## 2015-02-17 NOTE — Telephone Encounter (Signed)
[name] [name] has an appt with [name] tomorrow and he is no longer wanting to be on the medication.  He is still in pain but wanting off meds.  He is cancelling the appt tomorrow and wants directions in how to get off meds. The problem is his last refill was 01/21/15 which means he should be out in a day or so. After talking with Dr Riley KillSwartz and the fact he filled the rx from Dr Laban EmperorNaveira, Dr Riley KillSwartz says that Dr Laban EmperorNaveira can give wean down instructions and he will not prescribe any wean down prescription. His appt will be cancelled and he will be discharged.  Mr [name] did not want any bad feelings and he feels he did not violate a contract since the prescription he filled was from a previous rx that had refills, but this was still a violation of his signed contract with Dr Riley KillSwartz. He is being discharged from clinic.

## 2015-02-18 ENCOUNTER — Encounter: Payer: Federal, State, Local not specified - PPO | Admitting: Registered Nurse

## 2015-05-26 ENCOUNTER — Other Ambulatory Visit: Payer: Self-pay | Admitting: Family Medicine

## 2015-05-29 ENCOUNTER — Emergency Department (HOSPITAL_COMMUNITY)
Admission: EM | Admit: 2015-05-29 | Discharge: 2015-05-30 | Disposition: A | Discharge status: Other Acute Inpatient Hospital | Payer: Federal, State, Local not specified - PPO | Attending: Emergency Medicine | Admitting: Emergency Medicine

## 2015-05-29 ENCOUNTER — Encounter (HOSPITAL_COMMUNITY): Payer: Self-pay

## 2015-05-29 DIAGNOSIS — Z79899 Other long term (current) drug therapy: Secondary | ICD-10-CM | POA: Diagnosis not present

## 2015-05-29 DIAGNOSIS — Z87828 Personal history of other (healed) physical injury and trauma: Secondary | ICD-10-CM | POA: Insufficient documentation

## 2015-05-29 DIAGNOSIS — Z726 Gambling and betting: Secondary | ICD-10-CM | POA: Insufficient documentation

## 2015-05-29 DIAGNOSIS — R45851 Suicidal ideations: Secondary | ICD-10-CM

## 2015-05-29 DIAGNOSIS — E119 Type 2 diabetes mellitus without complications: Secondary | ICD-10-CM | POA: Insufficient documentation

## 2015-05-29 DIAGNOSIS — Z720 Tobacco use: Secondary | ICD-10-CM | POA: Insufficient documentation

## 2015-05-29 DIAGNOSIS — I10 Essential (primary) hypertension: Secondary | ICD-10-CM | POA: Diagnosis not present

## 2015-05-29 DIAGNOSIS — F630 Pathological gambling: Secondary | ICD-10-CM

## 2015-05-29 DIAGNOSIS — Z794 Long term (current) use of insulin: Secondary | ICD-10-CM | POA: Diagnosis not present

## 2015-05-29 DIAGNOSIS — Z8781 Personal history of (healed) traumatic fracture: Secondary | ICD-10-CM | POA: Diagnosis not present

## 2015-05-29 DIAGNOSIS — F1310 Sedative, hypnotic or anxiolytic abuse, uncomplicated: Secondary | ICD-10-CM | POA: Diagnosis not present

## 2015-05-29 DIAGNOSIS — Z89512 Acquired absence of left leg below knee: Secondary | ICD-10-CM | POA: Diagnosis not present

## 2015-05-29 LAB — CBC
HEMATOCRIT: 43.5 % (ref 39.0–52.0)
HEMOGLOBIN: 15.1 g/dL (ref 13.0–17.0)
MCH: 30.5 pg (ref 26.0–34.0)
MCHC: 34.7 g/dL (ref 30.0–36.0)
MCV: 87.9 fL (ref 78.0–100.0)
Platelets: 226 10*3/uL (ref 150–400)
RBC: 4.95 MIL/uL (ref 4.22–5.81)
RDW: 14.6 % (ref 11.5–15.5)
WBC: 9.4 10*3/uL (ref 4.0–10.5)

## 2015-05-29 LAB — COMPREHENSIVE METABOLIC PANEL
ALBUMIN: 4.5 g/dL (ref 3.5–5.0)
ALK PHOS: 62 U/L (ref 38–126)
ALT: 34 U/L (ref 17–63)
ANION GAP: 12 (ref 5–15)
AST: 26 U/L (ref 15–41)
BUN: 19 mg/dL (ref 6–20)
CHLORIDE: 103 mmol/L (ref 101–111)
CO2: 22 mmol/L (ref 22–32)
Calcium: 9.5 mg/dL (ref 8.9–10.3)
Creatinine, Ser: 0.92 mg/dL (ref 0.61–1.24)
GFR calc non Af Amer: >60 mL/min (ref >60)
GLUCOSE: 158 mg/dL — AB (ref 65–99)
POTASSIUM: 3.6 mmol/L (ref 3.5–5.1)
SODIUM: 137 mmol/L (ref 135–145)
Total Bilirubin: 0.7 mg/dL (ref 0.3–1.2)
Total Protein: 7.7 g/dL (ref 6.5–8.1)

## 2015-05-29 LAB — RAPID URINE DRUG SCREEN, HOSP PERFORMED
AMPHETAMINES: NOT DETECTED
BENZODIAZEPINES: POSITIVE — AB
Barbiturates: NOT DETECTED
Cocaine: NOT DETECTED
Opiates: NOT DETECTED
TETRAHYDROCANNABINOL: NOT DETECTED

## 2015-05-29 LAB — CBG MONITORING, ED: GLUCOSE-CAPILLARY: 254 mg/dL — AB (ref 65–99)

## 2015-05-29 LAB — ETHANOL: Alcohol, Ethyl (B): <5 mg/dL (ref <5)
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
## 2015-05-29 NOTE — Progress Notes (Signed)
Referral made at: [facility]- refax per Kevin,fax referral for the wait-list. [facility]- per Victorino Dike- fax referral. [facility]- per Renea Ee, fax it for review. [facility] - per Kathlene November, far referral for review but no beds tonight. [facility] - per Britta Mccreedy, at capacity but fax referral over.  [facility]- attempted to reach multiple times but the phone connection failed.  At capacity: [facility] - accepting only male until further notice, per Britta Mccreedy. [facility] [place], [place] Disposition staff 05/29/2015 10:00 PM

## 2015-05-29 NOTE — ED Notes (Signed)
Bed: [facility] Expected date:  Expected time:  Means of arrival:  Comments: Hold for Triage 4
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
## 2015-05-29 NOTE — ED Notes (Signed)
Pt. Had 2 [place] sandwiches, 6 graham crackers, 4 peanut butters. Nurse was notified.

## 2015-05-30 ENCOUNTER — Encounter (HOSPITAL_COMMUNITY): Payer: Self-pay | Admitting: Registered Nurse

## 2015-05-30 NOTE — BH Assessment (Signed)
[facility] Assessment Progress Note    Clinician received call from Heath at [facility]. She said they were interested in accepting him but wanted to make sure he would come voluntarily. Clinician talked to patient. He said that he wanted to get help for his gambling addiction. Clinician was told by patient that he "would think about it."  Clinician contacted Okey Regal back at [facility] and let her know to consider other patients for that bed.  Clinician also got a call from Amy at [facility].  She said they had a bed available for patient and could take him after 09:00.  Clinician told her he would talk to patient.

## 2015-05-30 NOTE — ED Notes (Signed)
Patient is resting comfortably. 

## 2015-05-30 NOTE — BH Assessment (Signed)
[facility] Assessment Progress Note   Clinician spoke to patient about his being accepted to [facility].  He wanted to make sure that they would be helping with his gambling.  Clinician told him that they would be addressing his detox, suicide needs and his addiction issues.  Patient said he would take that bed at [facility],  Clinician called Amy at [facility] and she said that the accepting physician is Dr. Lonni Fix.  Patient will be going to the [facility] bldg for admission.  Nurse call report number is [phone].  They can take him after 09:00.

## 2015-11-11 ENCOUNTER — Emergency Department (HOSPITAL_COMMUNITY): Payer: Federal, State, Local not specified - PPO

## 2015-11-11 ENCOUNTER — Inpatient Hospital Stay (HOSPITAL_COMMUNITY): Payer: Federal, State, Local not specified - PPO

## 2015-11-11 ENCOUNTER — Encounter (HOSPITAL_COMMUNITY): Payer: Self-pay | Admitting: *Deleted

## 2015-11-11 ENCOUNTER — Inpatient Hospital Stay (HOSPITAL_COMMUNITY)
Admission: EM | Admit: 2015-11-11 | Discharge: 2015-11-13 | DRG: 440 | Disposition: A | Discharge status: Home / Self Care | Payer: Federal, State, Local not specified - PPO | Attending: Internal Medicine | Admitting: Internal Medicine

## 2015-11-11 DIAGNOSIS — Z720 Tobacco use: Secondary | ICD-10-CM | POA: Diagnosis present

## 2015-11-11 DIAGNOSIS — I10 Essential (primary) hypertension: Secondary | ICD-10-CM | POA: Diagnosis present

## 2015-11-11 DIAGNOSIS — T502X5A Adverse effect of carbonic-anhydrase inhibitors, benzothiadiazides and other diuretics, initial encounter: Secondary | ICD-10-CM | POA: Diagnosis present

## 2015-11-11 DIAGNOSIS — K853 Drug induced acute pancreatitis without necrosis or infection: Secondary | ICD-10-CM | POA: Diagnosis present

## 2015-11-11 DIAGNOSIS — F17210 Nicotine dependence, cigarettes, uncomplicated: Secondary | ICD-10-CM | POA: Diagnosis present

## 2015-11-11 DIAGNOSIS — R739 Hyperglycemia, unspecified: Secondary | ICD-10-CM | POA: Diagnosis present

## 2015-11-11 DIAGNOSIS — E1165 Type 2 diabetes mellitus with hyperglycemia: Secondary | ICD-10-CM | POA: Diagnosis present

## 2015-11-11 DIAGNOSIS — K859 Acute pancreatitis without necrosis or infection, unspecified: Secondary | ICD-10-CM | POA: Diagnosis not present

## 2015-11-11 DIAGNOSIS — F419 Anxiety disorder, unspecified: Secondary | ICD-10-CM | POA: Diagnosis present

## 2015-11-11 DIAGNOSIS — Z89512 Acquired absence of left leg below knee: Secondary | ICD-10-CM

## 2015-11-11 DIAGNOSIS — E785 Hyperlipidemia, unspecified: Secondary | ICD-10-CM | POA: Diagnosis present

## 2015-11-11 DIAGNOSIS — E876 Hypokalemia: Secondary | ICD-10-CM | POA: Diagnosis present

## 2015-11-11 DIAGNOSIS — R109 Unspecified abdominal pain: Secondary | ICD-10-CM | POA: Diagnosis present

## 2015-11-11 DIAGNOSIS — Z915 Personal history of self-harm: Secondary | ICD-10-CM

## 2015-11-11 DIAGNOSIS — R112 Nausea with vomiting, unspecified: Secondary | ICD-10-CM | POA: Diagnosis present

## 2015-11-11 DIAGNOSIS — R1110 Vomiting, unspecified: Secondary | ICD-10-CM | POA: Diagnosis not present

## 2015-11-11 DIAGNOSIS — Z87828 Personal history of other (healed) physical injury and trauma: Secondary | ICD-10-CM

## 2015-11-11 DIAGNOSIS — E781 Pure hyperglyceridemia: Secondary | ICD-10-CM | POA: Diagnosis present

## 2015-11-11 DIAGNOSIS — K858 Other acute pancreatitis without necrosis or infection: Secondary | ICD-10-CM | POA: Diagnosis not present

## 2015-11-11 DIAGNOSIS — F329 Major depressive disorder, single episode, unspecified: Secondary | ICD-10-CM | POA: Diagnosis present

## 2015-11-11 DIAGNOSIS — E1140 Type 2 diabetes mellitus with diabetic neuropathy, unspecified: Secondary | ICD-10-CM | POA: Diagnosis present

## 2015-11-11 DIAGNOSIS — K861 Other chronic pancreatitis: Secondary | ICD-10-CM | POA: Diagnosis present

## 2015-11-11 DIAGNOSIS — IMO0002 Reserved for concepts with insufficient information to code with codable children: Secondary | ICD-10-CM | POA: Diagnosis present

## 2015-11-11 HISTORY — DX: Tobacco use: Z72.0

## 2015-11-11 HISTORY — DX: Acute pancreatitis without necrosis or infection, unspecified: K85.90

## 2015-11-11 HISTORY — DX: Suicide attempt, initial encounter: T14.91XA

## 2015-11-11 HISTORY — DX: Anxiety disorder, unspecified: F41.9

## 2015-11-11 LAB — BASIC METABOLIC PANEL
Anion gap: 14 (ref 5–15)
BUN: 10 mg/dL (ref 6–20)
CALCIUM: 8.8 mg/dL — AB (ref 8.9–10.3)
CO2: 25 mmol/L (ref 22–32)
CREATININE: 0.79 mg/dL (ref 0.61–1.24)
Chloride: 103 mmol/L (ref 101–111)
GFR calc non Af Amer: >60 mL/min (ref >60)
GLUCOSE: 163 mg/dL — AB (ref 65–99)
Potassium: 3.9 mmol/L (ref 3.5–5.1)
Sodium: 142 mmol/L (ref 135–145)

## 2015-11-11 LAB — MAGNESIUM: Magnesium: 1.2 mg/dL — ABNORMAL LOW (ref 1.7–2.4)

## 2015-11-11 LAB — GLUCOSE, CAPILLARY: Glucose-Capillary: 154 mg/dL — ABNORMAL HIGH (ref 65–99)

## 2015-11-11 LAB — COMPREHENSIVE METABOLIC PANEL
ALK PHOS: 32 U/L — AB (ref 38–126)
ALT: 24 U/L (ref 17–63)
ANION GAP: 5 (ref 5–15)
AST: 17 U/L (ref 15–41)
Albumin: 2.2 g/dL — ABNORMAL LOW (ref 3.5–5.0)
BILIRUBIN TOTAL: 0.4 mg/dL (ref 0.3–1.2)
BUN: 10 mg/dL (ref 6–20)
CALCIUM: 6.2 mg/dL — AB (ref 8.9–10.3)
CO2: 20 mmol/L — AB (ref 22–32)
CREATININE: 0.54 mg/dL — AB (ref 0.61–1.24)
Chloride: 118 mmol/L — ABNORMAL HIGH (ref 101–111)
Glucose, Bld: 221 mg/dL — ABNORMAL HIGH (ref 65–99)
Potassium: 2.2 mmol/L — CL (ref 3.5–5.1)
SODIUM: 143 mmol/L (ref 135–145)
TOTAL PROTEIN: 3.6 g/dL — AB (ref 6.5–8.1)

## 2015-11-11 LAB — URINALYSIS, ROUTINE W REFLEX MICROSCOPIC
BILIRUBIN URINE: NEGATIVE
Glucose, UA: >1000 mg/dL — AB
HGB URINE DIPSTICK: NEGATIVE
KETONES UR: NEGATIVE mg/dL
Leukocytes, UA: NEGATIVE
NITRITE: NEGATIVE
PH: 5.5 (ref 5.0–8.0)
Protein, ur: NEGATIVE mg/dL
Specific Gravity, Urine: 1.022 (ref 1.005–1.030)

## 2015-11-11 LAB — CBC WITH DIFFERENTIAL/PLATELET
Basophils Absolute: 0 10*3/uL (ref 0.0–0.1)
Basophils Relative: 1 %
EOS ABS: 0.1 10*3/uL (ref 0.0–0.7)
EOS PCT: 2 %
HCT: 34.2 % — ABNORMAL LOW (ref 39.0–52.0)
HEMOGLOBIN: 11.6 g/dL — AB (ref 13.0–17.0)
LYMPHS ABS: 1.6 10*3/uL (ref 0.7–4.0)
LYMPHS PCT: 31 %
MCH: 30.3 pg (ref 26.0–34.0)
MCHC: 33.9 g/dL (ref 30.0–36.0)
MCV: 89.3 fL (ref 78.0–100.0)
MONOS PCT: 7 %
Monocytes Absolute: 0.4 10*3/uL (ref 0.1–1.0)
NEUTROS PCT: 59 %
Neutro Abs: 3.2 10*3/uL (ref 1.7–7.7)
Platelets: 172 10*3/uL (ref 150–400)
RBC: 3.83 MIL/uL — AB (ref 4.22–5.81)
RDW: 13.5 % (ref 11.5–15.5)
WBC: 5.3 10*3/uL (ref 4.0–10.5)

## 2015-11-11 LAB — LIPID PANEL
CHOLESTEROL: 142 mg/dL (ref 0–200)
HDL: 33 mg/dL — AB (ref >40)
LDL CALC: UNDETERMINED mg/dL (ref 0–99)
TRIGLYCERIDES: 412 mg/dL — AB (ref <150)
Total CHOL/HDL Ratio: 4.3 RATIO
VLDL: UNDETERMINED mg/dL (ref 0–40)

## 2015-11-11 LAB — LIPASE, BLOOD: LIPASE: 190 U/L — AB (ref 11–51)

## 2015-11-11 LAB — RAPID URINE DRUG SCREEN, HOSP PERFORMED
Amphetamines: NOT DETECTED
BARBITURATES: NOT DETECTED
Benzodiazepines: POSITIVE — AB
Cocaine: NOT DETECTED
Opiates: POSITIVE — AB
TETRAHYDROCANNABINOL: NOT DETECTED

## 2015-11-11 LAB — URINE MICROSCOPIC-ADD ON
BACTERIA UA: NONE SEEN
RBC / HPF: NONE SEEN RBC/hpf (ref 0–5)

## 2015-11-11 LAB — CBG MONITORING, ED
Glucose-Capillary: 304 mg/dL — ABNORMAL HIGH (ref 65–99)
Glucose-Capillary: 163 mg/dL — ABNORMAL HIGH (ref 65–99)

## 2015-11-11 LAB — ETHANOL: Alcohol, Ethyl (B): <5 mg/dL (ref <5)

## 2015-11-11 MED ORDER — HYDROCODONE-ACETAMINOPHEN 5-325 MG PO TABS
1.0000 | ORAL_TABLET | ORAL | Status: DC | PRN
  Administered 2015-11-11 – 2015-11-13 (×9): 2 via ORAL
  Administered 2015-11-13: 1 via ORAL
  Filled 2015-11-11 (×11): qty 2

## 2015-11-11 MED ORDER — ONDANSETRON HCL 4 MG/2ML IJ SOLN: 4.0000 mg | Freq: Four times a day (QID) | INTRAMUSCULAR | Status: DC | PRN

## 2015-11-11 MED ORDER — INSULIN ASPART 100 UNIT/ML ~~LOC~~ SOLN: 0.0000 [IU] | Freq: Every day | SUBCUTANEOUS | Status: DC

## 2015-11-11 MED ORDER — SODIUM CHLORIDE 0.9 % IV BOLUS (SEPSIS)
1000.0000 mL | Freq: Once | INTRAVENOUS | Status: AC
Start: 2015-11-11 — End: 2015-11-11
  Administered 2015-11-11: 1000 mL via INTRAVENOUS

## 2015-11-11 MED ORDER — ONDANSETRON HCL 4 MG/2ML IJ SOLN
4.0000 mg | Freq: Once | INTRAMUSCULAR | Status: AC
  Administered 2015-11-11: 4 mg via INTRAVENOUS
  Filled 2015-11-11: qty 2

## 2015-11-11 MED ORDER — LISINOPRIL 40 MG PO TABS
40.0000 mg | ORAL_TABLET | Freq: Every day | ORAL | Status: DC
  Administered 2015-11-11 – 2015-11-13 (×3): 40 mg via ORAL
  Filled 2015-11-11 (×3): qty 1

## 2015-11-11 MED ORDER — LISINOPRIL 40 MG PO TABS: 40.0000 mg | ORAL_TABLET | Freq: Every day | ORAL | Status: DC

## 2015-11-11 MED ORDER — SODIUM CHLORIDE 0.9 % IV SOLN
INTRAVENOUS | Status: AC
  Administered 2015-11-11: 16:00:00 via INTRAVENOUS

## 2015-11-11 MED ORDER — HYDROMORPHONE HCL 1 MG/ML IJ SOLN
1.0000 mg | Freq: Once | INTRAMUSCULAR | Status: AC
  Administered 2015-11-11: 1 mg via INTRAVENOUS
  Filled 2015-11-11: qty 1

## 2015-11-11 MED ORDER — ENOXAPARIN SODIUM 30 MG/0.3ML ~~LOC~~ SOLN
30.0000 mg | SUBCUTANEOUS | Status: DC
  Administered 2015-11-11: 30 mg via SUBCUTANEOUS
  Filled 2015-11-11: qty 0.3

## 2015-11-11 MED ORDER — INSULIN ASPART 100 UNIT/ML ~~LOC~~ SOLN
0.0000 [IU] | Freq: Three times a day (TID) | SUBCUTANEOUS | Status: DC
  Administered 2015-11-12 (×2): 4 [IU] via SUBCUTANEOUS
  Administered 2015-11-12: 7 [IU] via SUBCUTANEOUS
  Administered 2015-11-13 (×2): 3 [IU] via SUBCUTANEOUS

## 2015-11-11 MED ORDER — ACETAMINOPHEN 325 MG PO TABS: 650.0000 mg | ORAL_TABLET | Freq: Four times a day (QID) | ORAL | Status: DC | PRN

## 2015-11-11 MED ORDER — POTASSIUM CHLORIDE CRYS ER 20 MEQ PO TBCR
40.0000 meq | EXTENDED_RELEASE_TABLET | ORAL | Status: AC
  Administered 2015-11-11 – 2015-11-12 (×2): 40 meq via ORAL
  Filled 2015-11-11 (×2): qty 2

## 2015-11-11 MED ORDER — HYDROMORPHONE HCL 1 MG/ML IJ SOLN: 1.0000 mg | INTRAMUSCULAR | Status: DC | PRN

## 2015-11-11 MED ORDER — ONDANSETRON HCL 4 MG PO TABS: 4.0000 mg | ORAL_TABLET | Freq: Four times a day (QID) | ORAL | Status: DC | PRN

## 2015-11-11 MED ORDER — HYDROMORPHONE HCL 1 MG/ML IJ SOLN
0.5000 mg | INTRAMUSCULAR | Status: AC | PRN
  Administered 2015-11-11 – 2015-11-13 (×6): 0.5 mg via INTRAVENOUS
  Filled 2015-11-11 (×7): qty 1

## 2015-11-11 MED ORDER — MORPHINE SULFATE (PF) 4 MG/ML IV SOLN
4.0000 mg | Freq: Once | INTRAVENOUS | Status: AC
  Administered 2015-11-11: 4 mg via INTRAVENOUS
  Filled 2015-11-11: qty 1

## 2015-11-11 MED ORDER — ALPRAZOLAM 0.5 MG PO TABS
0.5000 mg | ORAL_TABLET | Freq: Three times a day (TID) | ORAL | Status: DC | PRN
  Administered 2015-11-11 – 2015-11-13 (×5): 0.5 mg via ORAL
  Filled 2015-11-11 (×5): qty 1

## 2015-11-11 MED ORDER — POTASSIUM CHLORIDE CRYS ER 20 MEQ PO TBCR: 40.0000 meq | EXTENDED_RELEASE_TABLET | ORAL | Status: DC

## 2015-11-11 MED ORDER — ACETAMINOPHEN 650 MG RE SUPP: 650.0000 mg | Freq: Four times a day (QID) | RECTAL | Status: DC | PRN

## 2015-11-11 MED ORDER — POTASSIUM CHLORIDE CRYS ER 20 MEQ PO TBCR
20.0000 meq | EXTENDED_RELEASE_TABLET | Freq: Once | ORAL | Status: AC
  Administered 2015-11-11: 20 meq via ORAL
  Filled 2015-11-11: qty 1
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
## 2015-11-11 NOTE — ED Notes (Signed)
Patient transported to X-ray 

## 2015-11-11 NOTE — ED Notes (Signed)
Attempted report to [facility]W. 

## 2015-11-11 NOTE — ED Notes (Signed)
Pt states he woke up this morning and was feeling dizzy and began vomiting. Pt states his blood sugar was high. Pt states he has been "real sick" and has had a cough. Upon arrival pt cbg was 304. Pt also endorses abdominal pain x3 days.
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
## 2015-11-12 ENCOUNTER — Encounter (HOSPITAL_COMMUNITY): Payer: Self-pay | Admitting: General Practice

## 2015-11-12 DIAGNOSIS — K858 Other acute pancreatitis without necrosis or infection: Principal | ICD-10-CM

## 2015-11-12 DIAGNOSIS — I10 Essential (primary) hypertension: Secondary | ICD-10-CM

## 2015-11-12 DIAGNOSIS — R1110 Vomiting, unspecified: Secondary | ICD-10-CM

## 2015-11-12 DIAGNOSIS — R739 Hyperglycemia, unspecified: Secondary | ICD-10-CM

## 2015-11-12 LAB — GLUCOSE, CAPILLARY
GLUCOSE-CAPILLARY: 207 mg/dL — AB (ref 65–99)
GLUCOSE-CAPILLARY: 182 mg/dL — AB (ref 65–99)
GLUCOSE-CAPILLARY: 158 mg/dL — AB (ref 65–99)
Glucose-Capillary: 186 mg/dL — ABNORMAL HIGH (ref 65–99)

## 2015-11-12 LAB — CBC
HEMATOCRIT: 39.1 % (ref 39.0–52.0)
Hemoglobin: 13.7 g/dL (ref 13.0–17.0)
MCH: 31.3 pg (ref 26.0–34.0)
MCHC: 35 g/dL (ref 30.0–36.0)
MCV: 89.3 fL (ref 78.0–100.0)
PLATELETS: 187 10*3/uL (ref 150–400)
RBC: 4.38 MIL/uL (ref 4.22–5.81)
RDW: 13.7 % (ref 11.5–15.5)
WBC: 5.6 10*3/uL (ref 4.0–10.5)

## 2015-11-12 LAB — LIPASE, BLOOD: Lipase: 149 U/L — ABNORMAL HIGH (ref 11–51)

## 2015-11-12 LAB — BASIC METABOLIC PANEL
Anion gap: 8 (ref 5–15)
BUN: 9 mg/dL (ref 6–20)
CHLORIDE: 108 mmol/L (ref 101–111)
CO2: 24 mmol/L (ref 22–32)
CREATININE: 0.8 mg/dL (ref 0.61–1.24)
Calcium: 8.5 mg/dL — ABNORMAL LOW (ref 8.9–10.3)
GFR calc non Af Amer: >60 mL/min (ref >60)
Glucose, Bld: 193 mg/dL — ABNORMAL HIGH (ref 65–99)
POTASSIUM: 4.7 mmol/L (ref 3.5–5.1)
Sodium: 140 mmol/L (ref 135–145)

## 2015-11-12 LAB — HEMOGLOBIN A1C
Hgb A1c MFr Bld: 8.8 % — ABNORMAL HIGH (ref 4.8–5.6)
Mean Plasma Glucose: 206 mg/dL

## 2015-11-12 MED ORDER — ENOXAPARIN SODIUM 40 MG/0.4ML ~~LOC~~ SOLN
40.0000 mg | SUBCUTANEOUS | Status: DC
  Administered 2015-11-12: 40 mg via SUBCUTANEOUS
  Filled 2015-11-12: qty 0.4

## 2015-11-12 MED ORDER — SODIUM CHLORIDE 0.9 % IV SOLN
INTRAVENOUS | Status: DC
  Administered 2015-11-12 – 2015-11-13 (×3): via INTRAVENOUS

## 2015-11-12 MED ORDER — MAGNESIUM SULFATE 2 GM/50ML IV SOLN
2.0000 g | Freq: Once | INTRAVENOUS | Status: AC
  Administered 2015-11-12: 2 g via INTRAVENOUS
  Filled 2015-11-12: qty 50

## 2015-11-12 MED ORDER — ATORVASTATIN CALCIUM 40 MG PO TABS
40.0000 mg | ORAL_TABLET | Freq: Every day | ORAL | Status: DC
  Administered 2015-11-12: 40 mg via ORAL
  Filled 2015-11-12 (×2): qty 1

## 2015-11-12 NOTE — Progress Notes (Signed)
Utilization review completed. Maris Bena, RN, BSN. 
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
## 2015-11-12 NOTE — Plan of Care (Signed)
Problem: Food- and Nutrition-Related Knowledge Deficit (NB-1.1) Goal: Nutrition education Formal process to instruct or train a patient/client in a skill or to impart knowledge to help patients/clients voluntarily manage or modify food choices and eating behavior to maintain or improve health. Outcome: Adequate for Discharge  RD consulted for nutrition education regarding diabetes.     Lab Results  Component Value Date    HGBA1C 8.8* 11/11/2015   Spoke with pt at bedside. He reports that he had good control of his DM up until November 2016 (average Hgb A1c in the 6 range). Prior to this, he was consuming 3 meals and 2 snacks per day (diet consisted of whole grains, lean protein, and fruits and vegetables). He was also working out regularly at [employer] (walking, bike, and weight lifting. 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 As a result his blood sugars and Hgb A1c began to rise and had difficulty managing his previous routine; PTA he admits to consuming one meal per day.   This RD spent the majority of the visit reviewing DM diet guidelines with pt. Pt is motivated to return to his previous good glycemic control and plan. He is very self-aware and able to identify strategies that have help him succeed with DM management previously.   RD provided "Carbohydrate Counting for People with Diabetes" handout from the Academy of Nutrition and Dietetics. Discussed different food groups and their effects on blood sugar, emphasizing carbohydrate-containing foods. Provided list of carbohydrates and recommended serving sizes of common foods.  Discussed importance of controlled and consistent carbohydrate intake throughout the day. Provided examples of ways to balance meals/snacks and encouraged intake of high-fiber, whole grain complex carbohydrates.  Teach back method used.  Expect fair to good compliance.  Adjusted body mass index is 33.3 kg/m(2). Pt meets criteria for obesity, class I based on current BMI.  Current diet order is clear liquid, patient is consuming approximately n/a% of meals at this time. Labs and medications reviewed. No further nutrition interventions warranted at this time. RD contact information provided. If additional nutrition issues arise, please re-consult RD.  Smaran Gaus A. Mayford Knife, RD, LDN, CDE Pager: [phone] After hours Pager: [phone]

## 2015-11-12 NOTE — Care Management Note (Signed)
Case Management Note  Patient Details  Name: [name] [name] MRN: [id number] Date of Birth: June 30, 1971  Subjective/Objective:                 Presents with abd pain/nausea, pancreatitis from home with wife. 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  Pt states independent with ADL's.    Action/Plan: Return to home when medically stable. CM to f/u with d/c needs.  Expected Discharge Date:                  Expected Discharge Plan:  Home/Self Care  In-House Referral:     Discharge planning Services  CM Consult  Post Acute Care Choice:    Choice offered to:     DME Arranged:    DME Agency:     HH Arranged:    HH Agency:     Status of Service:  In process, will continue to follow  Medicare Important Message Given:    Date Medicare IM Given:    Medicare IM give by:    Date Additional Medicare IM Given:    Additional Medicare Important Message give by:     If discussed at Long Length of Stay Meetings, dates discussed:    Additional Comments:  Epifanio Lesches, [place] [phone] 11/12/2015, 11:36 AM

## 2015-11-12 NOTE — Progress Notes (Signed)
[facility] PROGRESS NOTE  [name] [name] WUJ:[id number] DOB: [birth year] DOA: 11/11/2015 PCP: Sherrie Mustache, MD  Assessment/Plan: 1. Acute pancreatitis. -Patient presenting with complaints of abdominal pain that was associated with nausea and vomiting. -Right upper quadrant ultrasound revealed fatty infiltration of liver with mild amount of sludge noted within the gallbladder lumen. Radiology reporting no other abnormality seen in the right upper quadrant. No gallstones were noted. -Pancreatitis could be secondary to hypertriglyceridemia versus medication induced from hydrochlorothiazide. Labs revealed elevated triglyceride level of 412. -Continue supportive care, IV fluids, repeat lipase level in a.m.  2.  Dyslipidemia/hypertriglyceridemia  -Fasting lipid panel showed triglyceride level of 412 -Plan to start statin therapy with Lipitor 40 mg by mouth daily   3.  Hypokalemia -Initial lab work showing potassium of 2.2 likely secondary to GI losses. Improved to 3.9 with replacement  4.  Hypomagnesemia. -Lab showing magnesium of 1.2, likely secondary to dehydration -We'll replace with IV magnesium, repeat mag level in a.m.  5.  Diabetes mellitus -Patient on clear liquids now, we'll continue sliding scale coverage for now. Plan to restart metformin with advancement of diet.  6.  Hypertension. -Continue lisinopril 40 mg by mouth daily. Hydrochlorothiazide stopped given concern for dehydration, a [place] abnormalities, and possibility of medication induced pancreatitis   Code Status: Full code  Family Communication: Family not present  Disposition Plan: Continue supportive care, anticipate discharge next 24-48 hours    HPI/Subjective: [name] [name] is a 45 year old gentleman with a past medical history of diabetes mellitus, hypertension, status post below knee amputation of left lower extremity admitted to the medicine service on 11/11/2015 when he presented with complaints of  abdominal pain associated with nausea and vomiting. Initial lab work revealed a lipase level of 190. Labs also showed profound hypokalemia having potassium of 2.2.  pancreatitis felt to be secondary to hypertriglyceridemia versus medication induced from Hydro chlorothiazide. He was made nothing by mouth for bowel rest, started on IV fluid resuscitation and narcotic analgesia for pain management.  Objective: Filed Vitals:   11/12/15 1022 11/12/15 1424  BP: 126/77 118/69  Pulse: 74 71  Temp:    Resp:  19    Intake/Output Summary (Last 24 hours) at 11/12/15 1612 Last data filed at 11/12/15 1443  Gross per 24 hour  Intake   1350 ml  Output   1510 ml  Net   -160 ml   Filed Weights   11/11/15 1112 11/11/15 1931 11/12/15 0433  Weight: 106.595 kg (235 lb) 110.36 kg (243 lb 4.8 oz) 110.36 kg (243 lb 4.8 oz)    Exam:   General:  No acute distress, patient is nontoxic appearing   Cardiovascular: regular rate rhythm normal S1-S2   Respiratory: normal respiratory effort   Abdomen: there is mild to moderate pain to palpation over the epigastric region, no palpable masses or rebound tenderness or guarding   Musculoskeletal: Status post left below the knee amputation   Data Reviewed: Basic Metabolic Panel:  Recent Labs Lab 11/11/15 1200 11/11/15 2051 11/12/15 0527  NA 143 142 140  K 2.2* 3.9 4.7  CL 118* 103 108  CO2 20* 25 24  GLUCOSE 221* 163* 193*  BUN CREATININE 0.54* 0.79 0.80  CALCIUM 6.2* 8.8* 8.5*  MG  --  1.2*  --    Liver Function Tests:  Recent Labs Lab 11/11/15 1200  AST 17  ALT 24  ALKPHOS 32*  BILITOT 0.4  PROT 3.6*  ALBUMIN 2.2*    Recent Labs  Lab 11/11/15 1200 11/12/15 0527  LIPASE 190* 149*   No results for input(s): AMMONIA in the last 168 hours. CBC:  Recent Labs Lab 11/11/15 1200 11/12/15 0527  WBC 5.3 5.6  NEUTROABS 3.2  --   HGB 11.6* 13.7  HCT 34.2* 39.1  MCV 89.3 89.3  PLT 172 187   Cardiac Enzymes: No results  for input(s): CKTOTAL, CKMB, CKMBINDEX, TROPONINI in the last 168 hours. BNP (last 3 results) No results for input(s): BNP in the last 8760 hours.  ProBNP (last 3 results) No results for input(s): PROBNP in the last 8760 hours.  CBG:  Recent Labs Lab 11/11/15 1112 11/11/15 1430 11/11/15 2225 11/12/15 0749 11/12/15 1156  GLUCAP 304* 163* 154* 207* 182*    No results found for this or any previous visit (from the past 240 hour(s)).   Studies: Dg Chest 2 View  11/11/2015  CLINICAL DATA:  Cough and vomiting since this morning. EXAM: CHEST  2 VIEW COMPARISON:  None. FINDINGS: The heart is within normal limits in size. There is mild tortuosity of the thoracic aorta. The lungs are clear. No pleural effusion. The bony thorax is intact. A spinal cord stimulator is noted in the thoracic spine area. IMPRESSION: No acute cardiopulmonary findings. Electronically Signed   By: Rudie Meyer M.D.   On: 11/11/2015 12:39   US Abdomen Limited Ruq  11/11/2015  CLINICAL DATA:  Acute generalized abdominal pain. EXAM: US ABDOMEN LIMITED - RIGHT UPPER QUADRANT COMPARISON:  CT scan of March 30, 2014. FINDINGS: Gallbladder: No gallstones or wall thickening visualized. No sonographic Murphy sign noted by sonographer. Mild amount of sludge is noted. Common bile duct: Diameter: 5 mm which is within normal limits. Liver: No focal lesion identified. Increased echogenicity is noted consistent with fatty infiltration. IMPRESSION: Fatty infiltration of the liver. Mild amount of sludge noted within gallbladder lumen. No other abnormality seen in the right upper quadrant of the abdomen. Electronically Signed   By: Lupita Raider, M.D.   On: 11/11/2015 18:51    Scheduled Meds: . enoxaparin (LOVENOX) injection  40 mg Subcutaneous Q24H  . insulin aspart  0-20 Units Subcutaneous TID WC  . insulin aspart  0-5 Units Subcutaneous QHS  . lisinopril  40 mg Oral Daily   Continuous Infusions: . sodium chloride 125 mL/hr at  11/12/15 1341    Principal Problem:   Abdominal pain Active Problems:   Diabetes mellitus type 2, uncontrolled (HCC)   Essential hypertension   Pancreatitis   Hypokalemia   Nausea and vomiting   Hypertension   Tobacco use   Hyperglycemia    Time spent: 35 min    Jeralyn Bennett  [facility] Pager [phone] 7PM-7AM, please contact night-coverage at [email or web address], password [facility] 11/12/2015, 4:12 PM  LOS: 1 day

## 2015-11-13 DIAGNOSIS — E876 Hypokalemia: Secondary | ICD-10-CM

## 2015-11-13 DIAGNOSIS — K859 Acute pancreatitis without necrosis or infection, unspecified: Secondary | ICD-10-CM

## 2015-11-13 LAB — GLUCOSE, CAPILLARY
GLUCOSE-CAPILLARY: 149 mg/dL — AB (ref 65–99)
GLUCOSE-CAPILLARY: 127 mg/dL — AB (ref 65–99)

## 2015-11-13 LAB — BASIC METABOLIC PANEL
ANION GAP: 7 (ref 5–15)
BUN: 6 mg/dL (ref 6–20)
CALCIUM: 8.2 mg/dL — AB (ref 8.9–10.3)
CHLORIDE: 106 mmol/L (ref 101–111)
CO2: 24 mmol/L (ref 22–32)
Creatinine, Ser: 0.61 mg/dL (ref 0.61–1.24)
Glucose, Bld: 172 mg/dL — ABNORMAL HIGH (ref 65–99)
Potassium: 4.2 mmol/L (ref 3.5–5.1)
SODIUM: 137 mmol/L (ref 135–145)

## 2015-11-13 LAB — CBC
HEMATOCRIT: 38 % — AB (ref 39.0–52.0)
HEMOGLOBIN: 13.3 g/dL (ref 13.0–17.0)
MCH: 31.7 pg (ref 26.0–34.0)
MCHC: 35 g/dL (ref 30.0–36.0)
MCV: 90.5 fL (ref 78.0–100.0)
Platelets: 178 10*3/uL (ref 150–400)
RBC: 4.2 MIL/uL — ABNORMAL LOW (ref 4.22–5.81)
RDW: 13.5 % (ref 11.5–15.5)
WBC: 4.6 10*3/uL (ref 4.0–10.5)

## 2015-11-13 LAB — LIPASE, BLOOD: LIPASE: 62 U/L — AB (ref 11–51)

## 2015-11-13 LAB — MAGNESIUM: Magnesium: 1.8 mg/dL (ref 1.7–2.4)

## 2015-11-13 MED ORDER — FUROSEMIDE 40 MG PO TABS
40.0000 mg | ORAL_TABLET | Freq: Every day | ORAL | Status: DC
  Administered 2015-11-13: 40 mg via ORAL
  Filled 2015-11-13: qty 1

## 2015-11-13 MED ORDER — METFORMIN HCL 500 MG PO TABS
500.0000 mg | ORAL_TABLET | Freq: Two times a day (BID) | ORAL | Status: DC
  Administered 2015-11-13: 500 mg via ORAL
  Filled 2015-11-13 (×2): qty 1

## 2015-11-13 MED ORDER — HYDROMORPHONE HCL 1 MG/ML IJ SOLN
0.5000 mg | INTRAMUSCULAR | Status: DC | PRN
  Administered 2015-11-13 (×2): 0.5 mg via INTRAVENOUS
  Filled 2015-11-13 (×2): qty 1

## 2015-11-13 MED ORDER — FUROSEMIDE 20 MG PO TABS: 20.0000 mg | ORAL_TABLET | Freq: Every day | ORAL | Status: DC

## 2015-11-13 MED ORDER — ATORVASTATIN CALCIUM 40 MG PO TABS: 40.0000 mg | ORAL_TABLET | Freq: Every day | ORAL | Status: DC

## 2015-11-13 MED ORDER — HYDROCODONE-ACETAMINOPHEN 5-325 MG PO TABS: 2.0000 | ORAL_TABLET | Freq: Four times a day (QID) | ORAL | Status: DC | PRN

## 2015-11-13 NOTE — Discharge Summary (Signed)
Physician Discharge Summary  Humbert Morozov ZOX:[id number] DOB: 1971-08-20 DOA: 11/11/2015  PCP: Sherrie Mustache, MD  Admit date: 11/11/2015 Discharge date: 11/13/2015  Time spent: 35 minutes  Recommendations for Outpatient Follow-up:  1. Patient admitted for acute pancreatitis, recovered well, please follow up 2. He was started on Lipitor during this hospitalization for dyslipidemia 3. HCTZ was discontinued and substituted with Lasix 20 mg PO q daily, given concerns for pancreatitis    Discharge Diagnoses:  Principal Problem:   Abdominal pain Active Problems:   Diabetes mellitus type 2, uncontrolled (HCC)   Essential hypertension   Pancreatitis   Hypokalemia   Nausea and vomiting   Hypertension   Tobacco use   Hyperglycemia   Discharge Condition: Stable  Diet recommendation: Heart Healthy  Filed Weights   11/11/15 1931 11/12/15 0433 11/13/15 0700  Weight: 110.36 kg (243 lb 4.8 oz) 110.36 kg (243 lb 4.8 oz) 111.7 kg (246 lb 4.1 oz)    History of present illness:  [name] [name] is a 45 y.o. 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 Initial evaluation reveals absent consistent with pancreatitis, hypokalemia, hyperglycemia dehydration  Information is obtained from the patient. He reports gradual onset persistent worsening abdominal pain for the last 3-4 days. Associated symptoms include nausea with intermittent emesis. He denies any coffee ground emesis. He reports thick clear sputum. He states the pain is constant sharp mostly located in the left lower quadrant. He denies bright red blood per rectum melena diarrhea constipation. He denies chest pain shortness of breath diaphoresis headache dizziness syncope or near-syncope. He does report checking his blood sugar more than usual in the range has  been around 450. He denies fever chills lower extremity edema  In the emergency department he is given 2 L of normal saline IV pain medicine Zofran and potassium. He continued with abdominal pain in spite of these measures.  Hospital Course:  Mr Orantes is a 45 year old gentleman with a past medical history of diabetes mellitus, hypertension, status post below knee amputation of left lower extremity admitted to the medicine service on 11/11/2015 when he presented with complaints of abdominal pain associated with nausea and vomiting. Initial lab work revealed a lipase level of 190. Labs also showed profound hypokalemia having potassium of 2.2. Pancreatitis felt to be secondary to hypertriglyceridemia versus medication induced from Hydrochlorothiazide. Fasting lipid panel showed elevated triglyceride level of 412 which could have also precipitated pancreatitis. He was made nothing by mouth for bowel rest, started on IV fluid resuscitation and narcotic analgesia for pain management. He was also started on statin therapy for dyslipidemia. He showed gradual clinical improvement with his lipase trending down to 62 by 11/13/2015. By this DAT was tolerating by mouth intake. He reported feeling well enough to go home as his symptoms had significantly improved and was tolerating by mouth intake. Thus was discharged on 11/13/2015.   Discharge Exam: Filed Vitals:   11/13/15 0820 11/13/15 1503  BP: 134/69 129/81  Pulse: 71 76  Temp:  97.7 F (36.5 C)  Resp:  18     General: No acute distress, patient is nontoxic appearing, ambulating down the hallway  Cardiovascular: regular rate rhythm normal S1-S2   Respiratory: normal respiratory effort   Abdomen: there is mild to moderate pain to palpation over the epigastric region, no palpable masses or rebound tenderness or guarding  Musculoskeletal: Status post left below the knee amputation  Discharge Instructions   Discharge Instructions    Call MD  for:  difficulty breathing, headache or visual disturbances    Complete by:  As directed      Call MD for:  extreme fatigue    Complete by:  As directed      Call MD for:  hives    Complete by:  As directed      Call MD for:  persistant dizziness or light-headedness    Complete by:  As directed      Call MD for:  persistant nausea and vomiting    Complete by:  As directed      Call MD for:  redness, tenderness, or signs of infection (pain, swelling, redness, odor or green/yellow discharge around incision site)    Complete by:  As directed      Call MD for:  severe uncontrolled pain    Complete by:  As directed      Call MD for:  temperature >100.4    Complete by:  As directed      Call MD for:    Complete by:  As directed      Diet - low sodium heart healthy    Complete by:  As directed      Increase activity slowly    Complete by:  As directed           Current Discharge Medication List    START taking these medications   Details  atorvastatin (LIPITOR) 40 MG tablet Take 1 tablet (40 mg total) by mouth daily at 6 PM. Qty: 30 tablet, Refills: 1    furosemide (LASIX) 20 MG tablet Take 1 tablet (20 mg total) by mouth daily. Qty: 30 tablet, Refills: 0    HYDROcodone-acetaminophen (NORCO/VICODIN) 5-325 MG tablet Take 2 tablets by mouth every 6 (six) hours as needed for moderate pain. Qty: 10 tablet, Refills: 0      CONTINUE these medications which have NOT CHANGED   Details  acetaminophen (TYLENOL) 325 MG tablet Take 650 mg by mouth every 6 (six) hours as needed for mild pain.    ALPRAZolam (XANAX) 0.5 MG tablet Take 0.5 mg by mouth 3 (three) times daily as needed for anxiety.    lisinopril (PRINIVIL,ZESTRIL) 40 MG tablet Take 1 tablet (40 mg total) by mouth daily. Qty: 30 tablet, Refills: 1    metFORMIN (GLUCOPHAGE) 1000 MG tablet Take 1 tablet (1,000 mg total) by mouth daily with breakfast. Qty: 30 tablet, Refills: 1      STOP taking these medications      hydrochlorothiazide (HYDRODIURIL) 25 MG tablet        No Known Allergies Follow-up Information    Follow up with Sherrie Mustache, MD In 1 week.   Specialty:  Internal Medicine   Contact information:   [address] Marya Fossa   [place] [address] [phone]        The results of significant diagnostics from this hospitalization (including imaging, microbiology, ancillary and laboratory) are listed below for reference.    Significant Diagnostic Studies: Dg Chest 2 View  11/11/2015  CLINICAL DATA:  Cough and vomiting since this morning. EXAM: CHEST  2 VIEW COMPARISON:  None. FINDINGS: The heart is within normal limits in size. There is mild tortuosity of the thoracic aorta. The lungs are clear. No pleural effusion. The bony thorax is intact. A spinal cord stimulator is noted in the thoracic spine area. IMPRESSION: No acute  cardiopulmonary findings. Electronically Signed   By: Rudie Meyer M.D.   On: 11/11/2015 12:39   US Abdomen Limited Ruq  11/11/2015  CLINICAL DATA:  Acute generalized abdominal pain. EXAM: US ABDOMEN LIMITED - RIGHT UPPER QUADRANT COMPARISON:  CT scan of March 30, 2014. FINDINGS: Gallbladder: No gallstones or wall thickening visualized. No sonographic Murphy sign noted by sonographer. Mild amount of sludge is noted. Common bile duct: Diameter: 5 mm which is within normal limits. Liver: No focal lesion identified. Increased echogenicity is noted consistent with fatty infiltration. IMPRESSION: Fatty infiltration of the liver. Mild amount of sludge noted within gallbladder lumen. No other abnormality seen in the right upper quadrant of the abdomen. Electronically Signed   By: Lupita Raider, M.D.   On: 11/11/2015 18:51    Microbiology: No results found for this or any previous visit (from the past 240 hour(s)).   Labs: Basic Metabolic Panel:  Recent Labs Lab 11/11/15 1200 11/11/15 2051 11/12/15 0527 11/13/15 0539  NA 143 142 140 137  K 2.2* 3.9 4.7 4.2  CL 118* 103  108 106  CO2 20* GLUCOSE 221* 163* 193* 172*  BUN CREATININE 0.54* 0.79 0.80 0.61  CALCIUM 6.2* 8.8* 8.5* 8.2*  MG  --  1.2*  --  1.8   Liver Function Tests:  Recent Labs Lab 11/11/15 1200  AST 17  ALT 24  ALKPHOS 32*  BILITOT 0.4  PROT 3.6*  ALBUMIN 2.2*    Recent Labs Lab 11/11/15 1200 11/12/15 0527 11/13/15 0539  LIPASE 190* 149* 62*   No results for input(s): AMMONIA in the last 168 hours. CBC:  Recent Labs Lab 11/11/15 1200 11/12/15 0527 11/13/15 0539  WBC 5.3 5.6 4.6  NEUTROABS 3.2  --   --   HGB 11.6* 13.7 13.3  HCT 34.2* 39.1 38.0*  MCV 89.3 89.3 90.5  PLT 172 187 178   Cardiac Enzymes: No results for input(s): CKTOTAL, CKMB, CKMBINDEX, TROPONINI in the last 168 hours. BNP: BNP (last 3 results) No results for input(s): BNP in the last 8760 hours.  ProBNP (last 3 results) No results for input(s): PROBNP in the last 8760 hours.  CBG:  Recent Labs Lab 11/12/15 1156 11/12/15 1703 11/12/15 2123 11/13/15 0756 11/13/15 1218  GLUCAP 182* 158* 186* 149* 127*       Signed:  Jeralyn Bennett MD.  [facility] 11/13/2015, 4:26 PM

## 2015-11-13 NOTE — Progress Notes (Signed)
Patient states "my balls feel like pins and needles." on-call NP notified.

## 2015-11-13 NOTE — Progress Notes (Signed)
Nsg Discharge Note  Admit Date:  11/11/2015 Discharge date: 11/13/2015   Yunior Jain to be D/C'd home  per MD order.  AVS completed.  Copy for chart, and copy for patient signed, and dated. Patient able to verbalize understanding.  Discharge Medication:   Medication List    STOP taking these medications        hydrochlorothiazide 25 MG tablet  Commonly known as:  HYDRODIURIL      TAKE these medications        acetaminophen 325 MG tablet  Commonly known as:  TYLENOL  Take 650 mg by mouth every 6 (six) hours as needed for mild pain.     ALPRAZolam 0.5 MG tablet  Commonly known as:  XANAX  Take 0.5 mg by mouth 3 (three) times daily as needed for anxiety.     atorvastatin 40 MG tablet  Commonly known as:  LIPITOR  Take 1 tablet (40 mg total) by mouth daily at 6 PM.     furosemide 20 MG tablet  Commonly known as:  LASIX  Take 1 tablet (20 mg total) by mouth daily.     HYDROcodone-acetaminophen 5-325 MG tablet  Commonly known as:  NORCO/VICODIN  Take 2 tablets by mouth every 6 (six) hours as needed for moderate pain.     lisinopril 40 MG tablet  Commonly known as:  PRINIVIL,ZESTRIL  Take 1 tablet (40 mg total) by mouth daily.     metFORMIN 1000 MG tablet  Commonly known as:  GLUCOPHAGE  Take 1 tablet (1,000 mg total) by mouth daily with breakfast.        Discharge Assessment: Filed Vitals:   11/13/15 0820 11/13/15 1503  BP: 134/69 129/81  Pulse: 71 76  Temp:  97.7 F (36.5 C)  Resp:  18   Skin clean, dry and intact without evidence of skin break down, no evidence of skin tears noted. IV catheter discontinued with catheter tip intact. Site without signs and symptoms of complications - no redness or edema noted at insertion site, patient denies c/o pain - only slight tenderness at site.  Dressing with slight pressure applied.  D/c Instructions-Education: Discharge instructions given to patient with verbalized understanding. D/c education completed with patient   including follow up instructions, medication list, d/c activities limitations if indicated, with other d/c instructions as indicated by MD - patient able to verbalize understanding, all questions fully answered. Patient instructed to return to ED, call 911, or call MD for any changes in condition.  RN ambulated with pt to Emergency entrance, where pt parked his private automobile.   Tobin Chad, RN 11/13/2015 5:02 PM
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
## 2015-11-24 ENCOUNTER — Ambulatory Visit: Payer: Self-pay | Admitting: Gastroenterology

## 2015-12-04 ENCOUNTER — Emergency Department (HOSPITAL_COMMUNITY)
Admission: EM | Admit: 2015-12-04 | Discharge: 2015-12-04 | Disposition: A | Discharge status: Home / Self Care | Payer: Federal, State, Local not specified - PPO | Attending: Emergency Medicine | Admitting: Emergency Medicine

## 2015-12-04 ENCOUNTER — Encounter (HOSPITAL_COMMUNITY): Payer: Self-pay | Admitting: *Deleted

## 2015-12-04 DIAGNOSIS — E119 Type 2 diabetes mellitus without complications: Secondary | ICD-10-CM | POA: Diagnosis not present

## 2015-12-04 DIAGNOSIS — R1110 Vomiting, unspecified: Secondary | ICD-10-CM | POA: Insufficient documentation

## 2015-12-04 DIAGNOSIS — I10 Essential (primary) hypertension: Secondary | ICD-10-CM | POA: Diagnosis not present

## 2015-12-04 DIAGNOSIS — F17210 Nicotine dependence, cigarettes, uncomplicated: Secondary | ICD-10-CM | POA: Diagnosis not present

## 2015-12-04 DIAGNOSIS — R1012 Left upper quadrant pain: Secondary | ICD-10-CM | POA: Diagnosis present

## 2015-12-04 LAB — COMPREHENSIVE METABOLIC PANEL
ALK PHOS: 46 U/L (ref 38–126)
ALT: 42 U/L (ref 17–63)
AST: 32 U/L (ref 15–41)
Albumin: 4.4 g/dL (ref 3.5–5.0)
Anion gap: 17 — ABNORMAL HIGH (ref 5–15)
BILIRUBIN TOTAL: 0.4 mg/dL (ref 0.3–1.2)
BUN: 21 mg/dL — ABNORMAL HIGH (ref 6–20)
CALCIUM: 10.2 mg/dL (ref 8.9–10.3)
CO2: 24 mmol/L (ref 22–32)
CREATININE: 0.91 mg/dL (ref 0.61–1.24)
Chloride: 97 mmol/L — ABNORMAL LOW (ref 101–111)
GFR calc non Af Amer: >60 mL/min (ref >60)
GLUCOSE: 138 mg/dL — AB (ref 65–99)
Potassium: 3.3 mmol/L — ABNORMAL LOW (ref 3.5–5.1)
Sodium: 138 mmol/L (ref 135–145)
TOTAL PROTEIN: 7.1 g/dL (ref 6.5–8.1)

## 2015-12-04 LAB — CBC
HCT: 43.5 % (ref 39.0–52.0)
Hemoglobin: 15.4 g/dL (ref 13.0–17.0)
MCH: 31.2 pg (ref 26.0–34.0)
MCHC: 35.4 g/dL (ref 30.0–36.0)
MCV: 88.2 fL (ref 78.0–100.0)
PLATELETS: 280 10*3/uL (ref 150–400)
RBC: 4.93 MIL/uL (ref 4.22–5.81)
RDW: 13.3 % (ref 11.5–15.5)
WBC: 8.9 10*3/uL (ref 4.0–10.5)

## 2015-12-04 LAB — LIPASE, BLOOD: Lipase: 68 U/L — ABNORMAL HIGH (ref 11–51)

## 2015-12-04 MED ORDER — OXYCODONE-ACETAMINOPHEN 5-325 MG PO TABS
ORAL_TABLET | ORAL | Status: AC
  Filled 2015-12-04: qty 1

## 2015-12-04 MED ORDER — OXYCODONE-ACETAMINOPHEN 5-325 MG PO TABS
1.0000 | ORAL_TABLET | Freq: Once | ORAL | Status: AC
  Administered 2015-12-04: 1 via ORAL

## 2015-12-04 MED ORDER — ONDANSETRON 4 MG PO TBDP
ORAL_TABLET | ORAL | Status: AC
  Filled 2015-12-04: qty 1

## 2015-12-04 MED ORDER — ONDANSETRON 4 MG PO TBDP
4.0000 mg | ORAL_TABLET | Freq: Once | ORAL | Status: AC | PRN
  Administered 2015-12-04: 4 mg via ORAL

## 2015-12-04 NOTE — ED Notes (Signed)
Pt reports LUQ pain radiating to left back. Pt reports hx of pancreatitis. Pt denies any ETOH. Emesis x 2 today. Denies diarrhea.

## 2015-12-04 NOTE — ED Notes (Signed)
Pt questioned wait time, pt informed of the longest wait time, pt states he will not wait that long and will drive to [place] to be seen. Pt encouraged to stay but continued to refuse any further treatment.

## 2015-12-06 ENCOUNTER — Encounter (HOSPITAL_COMMUNITY): Payer: Self-pay | Admitting: *Deleted

## 2015-12-06 ENCOUNTER — Observation Stay (HOSPITAL_COMMUNITY)
Admission: EM | Admit: 2015-12-06 | Discharge: 2015-12-07 | Disposition: A | Discharge status: Home / Self Care | Payer: Federal, State, Local not specified - PPO | Attending: Internal Medicine | Admitting: Internal Medicine

## 2015-12-06 DIAGNOSIS — R1012 Left upper quadrant pain: Secondary | ICD-10-CM

## 2015-12-06 DIAGNOSIS — F17210 Nicotine dependence, cigarettes, uncomplicated: Secondary | ICD-10-CM | POA: Insufficient documentation

## 2015-12-06 DIAGNOSIS — F419 Anxiety disorder, unspecified: Secondary | ICD-10-CM | POA: Insufficient documentation

## 2015-12-06 DIAGNOSIS — Z89512 Acquired absence of left leg below knee: Secondary | ICD-10-CM | POA: Diagnosis not present

## 2015-12-06 DIAGNOSIS — Z79899 Other long term (current) drug therapy: Secondary | ICD-10-CM | POA: Diagnosis not present

## 2015-12-06 DIAGNOSIS — E1165 Type 2 diabetes mellitus with hyperglycemia: Secondary | ICD-10-CM | POA: Insufficient documentation

## 2015-12-06 DIAGNOSIS — N179 Acute kidney failure, unspecified: Secondary | ICD-10-CM | POA: Diagnosis present

## 2015-12-06 DIAGNOSIS — Z7984 Long term (current) use of oral hypoglycemic drugs: Secondary | ICD-10-CM | POA: Diagnosis not present

## 2015-12-06 DIAGNOSIS — I10 Essential (primary) hypertension: Secondary | ICD-10-CM | POA: Diagnosis not present

## 2015-12-06 DIAGNOSIS — Z720 Tobacco use: Secondary | ICD-10-CM | POA: Diagnosis present

## 2015-12-06 DIAGNOSIS — N289 Disorder of kidney and ureter, unspecified: Secondary | ICD-10-CM

## 2015-12-06 DIAGNOSIS — IMO0002 Reserved for concepts with insufficient information to code with codable children: Secondary | ICD-10-CM | POA: Diagnosis present

## 2015-12-06 DIAGNOSIS — R109 Unspecified abdominal pain: Secondary | ICD-10-CM | POA: Diagnosis present

## 2015-12-06 DIAGNOSIS — K859 Acute pancreatitis without necrosis or infection, unspecified: Secondary | ICD-10-CM | POA: Diagnosis not present

## 2015-12-06 DIAGNOSIS — R112 Nausea with vomiting, unspecified: Secondary | ICD-10-CM

## 2015-12-06 DIAGNOSIS — E1140 Type 2 diabetes mellitus with diabetic neuropathy, unspecified: Secondary | ICD-10-CM | POA: Diagnosis present

## 2015-12-06 LAB — URINALYSIS, ROUTINE W REFLEX MICROSCOPIC
Bilirubin Urine: NEGATIVE
Glucose, UA: NEGATIVE mg/dL
Hgb urine dipstick: NEGATIVE
KETONES UR: NEGATIVE mg/dL
LEUKOCYTES UA: NEGATIVE
NITRITE: NEGATIVE
PH: 6 (ref 5.0–8.0)
Protein, ur: NEGATIVE mg/dL
SPECIFIC GRAVITY, URINE: 1.018 (ref 1.005–1.030)

## 2015-12-06 LAB — CBC
HEMATOCRIT: 43.1 % (ref 39.0–52.0)
HEMOGLOBIN: 15 g/dL (ref 13.0–17.0)
MCH: 30.9 pg (ref 26.0–34.0)
MCHC: 34.8 g/dL (ref 30.0–36.0)
MCV: 88.7 fL (ref 78.0–100.0)
Platelets: 256 10*3/uL (ref 150–400)
RBC: 4.86 MIL/uL (ref 4.22–5.81)
RDW: 13.4 % (ref 11.5–15.5)
WBC: 8.8 10*3/uL (ref 4.0–10.5)
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
## 2015-12-06 NOTE — ED Notes (Signed)
Pt states left upper abdominal pain, urinary frequency, nausea, and vomiting. Pt reports hx of pancreatitis, feels similar.

## 2015-12-07 ENCOUNTER — Emergency Department (HOSPITAL_COMMUNITY): Payer: Federal, State, Local not specified - PPO

## 2015-12-07 ENCOUNTER — Encounter (HOSPITAL_COMMUNITY): Payer: Self-pay | Admitting: Radiology

## 2015-12-07 DIAGNOSIS — K859 Acute pancreatitis without necrosis or infection, unspecified: Secondary | ICD-10-CM | POA: Diagnosis present

## 2015-12-07 DIAGNOSIS — N179 Acute kidney failure, unspecified: Secondary | ICD-10-CM | POA: Diagnosis not present

## 2015-12-07 DIAGNOSIS — R1110 Vomiting, unspecified: Secondary | ICD-10-CM | POA: Insufficient documentation

## 2015-12-07 DIAGNOSIS — R1012 Left upper quadrant pain: Secondary | ICD-10-CM | POA: Diagnosis not present

## 2015-12-07 DIAGNOSIS — R112 Nausea with vomiting, unspecified: Secondary | ICD-10-CM

## 2015-12-07 DIAGNOSIS — E1165 Type 2 diabetes mellitus with hyperglycemia: Secondary | ICD-10-CM

## 2015-12-07 DIAGNOSIS — Z720 Tobacco use: Secondary | ICD-10-CM | POA: Diagnosis present

## 2015-12-07 DIAGNOSIS — E118 Type 2 diabetes mellitus with unspecified complications: Secondary | ICD-10-CM

## 2015-12-07 DIAGNOSIS — I10 Essential (primary) hypertension: Secondary | ICD-10-CM

## 2015-12-07 LAB — COMPREHENSIVE METABOLIC PANEL
ALT: 36 U/L (ref 17–63)
ANION GAP: 15 (ref 5–15)
AST: 27 U/L (ref 15–41)
Albumin: 4.1 g/dL (ref 3.5–5.0)
Alkaline Phosphatase: 50 U/L (ref 38–126)
BILIRUBIN TOTAL: 0.5 mg/dL (ref 0.3–1.2)
BUN: 27 mg/dL — AB (ref 6–20)
CHLORIDE: 101 mmol/L (ref 101–111)
CO2: 25 mmol/L (ref 22–32)
Calcium: 9.9 mg/dL (ref 8.9–10.3)
Creatinine, Ser: 1.42 mg/dL — ABNORMAL HIGH (ref 0.61–1.24)
GFR calc Af Amer: >60 mL/min (ref >60)
GFR calc non Af Amer: 58 mL/min — ABNORMAL LOW (ref >60)
GLUCOSE: 158 mg/dL — AB (ref 65–99)
POTASSIUM: 3.7 mmol/L (ref 3.5–5.1)
SODIUM: 141 mmol/L (ref 135–145)
Total Protein: 6.9 g/dL (ref 6.5–8.1)

## 2015-12-07 LAB — BASIC METABOLIC PANEL
ANION GAP: 8 (ref 5–15)
BUN: 26 mg/dL — ABNORMAL HIGH (ref 6–20)
CALCIUM: 8.5 mg/dL — AB (ref 8.9–10.3)
CO2: 24 mmol/L (ref 22–32)
Chloride: 108 mmol/L (ref 101–111)
Creatinine, Ser: 0.79 mg/dL (ref 0.61–1.24)
GLUCOSE: 188 mg/dL — AB (ref 65–99)
POTASSIUM: 3.4 mmol/L — AB (ref 3.5–5.1)
SODIUM: 140 mmol/L (ref 135–145)

## 2015-12-07 LAB — RAPID URINE DRUG SCREEN, HOSP PERFORMED
AMPHETAMINES: NOT DETECTED
BARBITURATES: NOT DETECTED
BENZODIAZEPINES: POSITIVE — AB
COCAINE: NOT DETECTED
Opiates: NOT DETECTED
TETRAHYDROCANNABINOL: NOT DETECTED

## 2015-12-07 LAB — CBC
HEMATOCRIT: 38.7 % — AB (ref 39.0–52.0)
HEMOGLOBIN: 12.9 g/dL — AB (ref 13.0–17.0)
MCH: 30.1 pg (ref 26.0–34.0)
MCHC: 33.3 g/dL (ref 30.0–36.0)
MCV: 90.4 fL (ref 78.0–100.0)
Platelets: 204 10*3/uL (ref 150–400)
RBC: 4.28 MIL/uL (ref 4.22–5.81)
RDW: 13.6 % (ref 11.5–15.5)
WBC: 5.6 10*3/uL (ref 4.0–10.5)

## 2015-12-07 LAB — GLUCOSE, CAPILLARY
GLUCOSE-CAPILLARY: 183 mg/dL — AB (ref 65–99)
GLUCOSE-CAPILLARY: 180 mg/dL — AB (ref 65–99)

## 2015-12-07 LAB — LIPASE, BLOOD
LIPASE: 64 U/L — AB (ref 11–51)
Lipase: 51 U/L (ref 11–51)

## 2015-12-07 LAB — ETHANOL: Alcohol, Ethyl (B): <5 mg/dL (ref <5)

## 2015-12-07 LAB — MAGNESIUM: Magnesium: 1.4 mg/dL — ABNORMAL LOW (ref 1.7–2.4)

## 2015-12-07 MED ORDER — NICOTINE 14 MG/24HR TD PT24
14.0000 mg | MEDICATED_PATCH | Freq: Every day | TRANSDERMAL | Status: DC
  Filled 2015-12-07: qty 1

## 2015-12-07 MED ORDER — ACETAMINOPHEN 325 MG PO TABS
650.0000 mg | ORAL_TABLET | Freq: Four times a day (QID) | ORAL | Status: DC | PRN
  Administered 2015-12-07: 650 mg via ORAL
  Filled 2015-12-07: qty 2

## 2015-12-07 MED ORDER — HYDRALAZINE HCL 20 MG/ML IJ SOLN: 5.0000 mg | Freq: Four times a day (QID) | INTRAMUSCULAR | Status: DC | PRN

## 2015-12-07 MED ORDER — OXYCODONE HCL 5 MG PO TABS
5.0000 mg | ORAL_TABLET | ORAL | Status: DC | PRN
  Administered 2015-12-07: 5 mg via ORAL
  Filled 2015-12-07: qty 1

## 2015-12-07 MED ORDER — INSULIN ASPART 100 UNIT/ML ~~LOC~~ SOLN
0.0000 [IU] | SUBCUTANEOUS | Status: DC
Start: 2015-12-07 — End: 2015-12-07
  Administered 2015-12-07 (×2): 2 [IU] via SUBCUTANEOUS

## 2015-12-07 MED ORDER — ACETAMINOPHEN 650 MG RE SUPP: 650.0000 mg | Freq: Four times a day (QID) | RECTAL | Status: DC | PRN

## 2015-12-07 MED ORDER — SODIUM CHLORIDE 0.9 % IV BOLUS (SEPSIS)
1000.0000 mL | Freq: Once | INTRAVENOUS | Status: AC
  Administered 2015-12-07: 1000 mL via INTRAVENOUS

## 2015-12-07 MED ORDER — SODIUM CHLORIDE 0.9 % IV SOLN
INTRAVENOUS | Status: DC
Start: 2015-12-07 — End: 2015-12-07

## 2015-12-07 MED ORDER — ONDANSETRON HCL 4 MG/2ML IJ SOLN: 4.0000 mg | Freq: Four times a day (QID) | INTRAMUSCULAR | Status: DC | PRN

## 2015-12-07 MED ORDER — IOHEXOL 300 MG/ML  SOLN
80.0000 mL | Freq: Once | INTRAMUSCULAR | Status: AC | PRN
  Administered 2015-12-07: 100 mL via INTRAVENOUS

## 2015-12-07 MED ORDER — SODIUM CHLORIDE 0.9 % IV SOLN: INTRAVENOUS | Status: DC

## 2015-12-07 MED ORDER — ENOXAPARIN SODIUM 40 MG/0.4ML ~~LOC~~ SOLN
40.0000 mg | SUBCUTANEOUS | Status: DC
  Administered 2015-12-07: 40 mg via SUBCUTANEOUS
  Filled 2015-12-07: qty 0.4

## 2015-12-07 MED ORDER — IOHEXOL 300 MG/ML  SOLN: 25.0000 mL | Freq: Once | INTRAMUSCULAR | Status: DC | PRN

## 2015-12-07 MED ORDER — ALPRAZOLAM 0.5 MG PO TABS
0.5000 mg | ORAL_TABLET | Freq: Three times a day (TID) | ORAL | Status: DC | PRN
  Administered 2015-12-07: 0.5 mg via ORAL
  Filled 2015-12-07: qty 1

## 2015-12-07 MED ORDER — HYDROMORPHONE HCL 1 MG/ML IJ SOLN
0.5000 mg | INTRAMUSCULAR | Status: DC | PRN
  Administered 2015-12-07 (×2): 1 mg via INTRAVENOUS
  Filled 2015-12-07 (×2): qty 1

## 2015-12-07 MED ORDER — FENTANYL CITRATE (PF) 100 MCG/2ML IJ SOLN
50.0000 ug | Freq: Once | INTRAMUSCULAR | Status: AC
  Administered 2015-12-07: 50 ug via INTRAVENOUS
  Filled 2015-12-07: qty 2

## 2015-12-07 MED ORDER — ONDANSETRON HCL 4 MG PO TABS: 4.0000 mg | ORAL_TABLET | Freq: Four times a day (QID) | ORAL | Status: DC | PRN

## 2015-12-07 MED ORDER — ALUM & MAG HYDROXIDE-SIMETH 200-200-20 MG/5ML PO SUSP: 30.0000 mL | Freq: Four times a day (QID) | ORAL | Status: DC | PRN

## 2015-12-07 MED ORDER — ONDANSETRON HCL 4 MG/2ML IJ SOLN
4.0000 mg | Freq: Once | INTRAMUSCULAR | Status: AC
  Administered 2015-12-07: 4 mg via INTRAVENOUS
  Filled 2015-12-07: qty 2

## 2015-12-07 MED ORDER — MAGNESIUM SULFATE 2 GM/50ML IV SOLN
2.0000 g | Freq: Once | INTRAVENOUS | Status: AC
  Administered 2015-12-07: 2 g via INTRAVENOUS
  Filled 2015-12-07: qty 50
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
## 2015-12-07 NOTE — ED Notes (Signed)
Pt. Transported to CT 
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
## 2015-12-07 NOTE — Progress Notes (Signed)
Pt states he wants to leave hospital due to him being thirsty and one MDtelling him he wold be on a clear liquid diet and another MD making him NPO.  Explained to the pt that if he wanted to leave, I could not keep him and he would be leaving AMA.  Asked pt to allow me time to speak with the doctor to make him aware and to see if his diet could be switched to clear liquids.  Pt agreed to waiting until RN spoke with MD.

## 2015-12-07 NOTE — Progress Notes (Signed)
Notified MD of pt's request of wanting clear liquid diet or leaving AMA.  MD stated that he explained to the pt that he would be NPO and that we would increase his maintenance fluids and allow him to have ice chips, but if he wanted to leave, that he could not stop him.  Pt did not  agree with the doctor's recommendation and still stated he was leaving.  Pt left AMA and refused to sign paperwork.  MD made aware.

## 2015-12-07 NOTE — ED Notes (Signed)
Called lab and added on Magnesium

## 2015-12-31 ENCOUNTER — Emergency Department (HOSPITAL_COMMUNITY)
Admission: EM | Admit: 2015-12-31 | Discharge: 2015-12-31 | Disposition: A | Discharge status: Home / Self Care | Payer: Federal, State, Local not specified - PPO | Attending: Emergency Medicine | Admitting: Emergency Medicine

## 2015-12-31 ENCOUNTER — Encounter (HOSPITAL_COMMUNITY): Payer: Self-pay | Admitting: Family Medicine

## 2015-12-31 DIAGNOSIS — R1032 Left lower quadrant pain: Secondary | ICD-10-CM | POA: Diagnosis not present

## 2015-12-31 DIAGNOSIS — Z79899 Other long term (current) drug therapy: Secondary | ICD-10-CM | POA: Insufficient documentation

## 2015-12-31 DIAGNOSIS — I10 Essential (primary) hypertension: Secondary | ICD-10-CM | POA: Insufficient documentation

## 2015-12-31 DIAGNOSIS — F419 Anxiety disorder, unspecified: Secondary | ICD-10-CM | POA: Diagnosis not present

## 2015-12-31 DIAGNOSIS — R1012 Left upper quadrant pain: Secondary | ICD-10-CM | POA: Diagnosis present

## 2015-12-31 DIAGNOSIS — R109 Unspecified abdominal pain: Secondary | ICD-10-CM

## 2015-12-31 DIAGNOSIS — E119 Type 2 diabetes mellitus without complications: Secondary | ICD-10-CM | POA: Insufficient documentation

## 2015-12-31 DIAGNOSIS — Z8781 Personal history of (healed) traumatic fracture: Secondary | ICD-10-CM | POA: Insufficient documentation

## 2015-12-31 DIAGNOSIS — R1013 Epigastric pain: Secondary | ICD-10-CM | POA: Insufficient documentation

## 2015-12-31 DIAGNOSIS — F17210 Nicotine dependence, cigarettes, uncomplicated: Secondary | ICD-10-CM | POA: Diagnosis not present

## 2015-12-31 LAB — URINALYSIS, ROUTINE W REFLEX MICROSCOPIC
Bilirubin Urine: NEGATIVE
GLUCOSE, UA: 250 mg/dL — AB
HGB URINE DIPSTICK: NEGATIVE
KETONES UR: NEGATIVE mg/dL
LEUKOCYTES UA: NEGATIVE
Nitrite: NEGATIVE
PH: 6 (ref 5.0–8.0)
Protein, ur: NEGATIVE mg/dL
Specific Gravity, Urine: 1.011 (ref 1.005–1.030)

## 2015-12-31 LAB — CBC
HEMATOCRIT: 45.3 % (ref 39.0–52.0)
Hemoglobin: 16.1 g/dL (ref 13.0–17.0)
MCH: 31.9 pg (ref 26.0–34.0)
MCHC: 35.5 g/dL (ref 30.0–36.0)
MCV: 89.7 fL (ref 78.0–100.0)
Platelets: 224 10*3/uL (ref 150–400)
RBC: 5.05 MIL/uL (ref 4.22–5.81)
RDW: 13.4 % (ref 11.5–15.5)
WBC: 6.7 10*3/uL (ref 4.0–10.5)

## 2015-12-31 LAB — COMPREHENSIVE METABOLIC PANEL
ALBUMIN: 4.5 g/dL (ref 3.5–5.0)
ALT: 39 U/L (ref 17–63)
AST: 25 U/L (ref 15–41)
Alkaline Phosphatase: 44 U/L (ref 38–126)
Anion gap: 10 (ref 5–15)
BUN: 12 mg/dL (ref 6–20)
CHLORIDE: 104 mmol/L (ref 101–111)
CO2: 27 mmol/L (ref 22–32)
CREATININE: 0.91 mg/dL (ref 0.61–1.24)
Calcium: 9.9 mg/dL (ref 8.9–10.3)
GFR calc Af Amer: >60 mL/min (ref >60)
Glucose, Bld: 130 mg/dL — ABNORMAL HIGH (ref 65–99)
POTASSIUM: 4.3 mmol/L (ref 3.5–5.1)
SODIUM: 141 mmol/L (ref 135–145)
Total Bilirubin: 0.9 mg/dL (ref 0.3–1.2)
Total Protein: 7.3 g/dL (ref 6.5–8.1)

## 2015-12-31 LAB — LIPASE, BLOOD: LIPASE: 98 U/L — AB (ref 11–51)

## 2015-12-31 MED ORDER — ONDANSETRON HCL 4 MG/2ML IJ SOLN
4.0000 mg | Freq: Once | INTRAMUSCULAR | Status: AC
  Administered 2015-12-31: 4 mg via INTRAVENOUS
  Filled 2015-12-31: qty 2

## 2015-12-31 MED ORDER — HYDROMORPHONE HCL 1 MG/ML IJ SOLN
1.0000 mg | Freq: Once | INTRAMUSCULAR | Status: AC
  Administered 2015-12-31: 1 mg via INTRAVENOUS
  Filled 2015-12-31: qty 1

## 2015-12-31 MED ORDER — OXYCODONE-ACETAMINOPHEN 5-325 MG PO TABS: 1.0000 | ORAL_TABLET | Freq: Four times a day (QID) | ORAL | Status: DC | PRN

## 2015-12-31 MED ORDER — SODIUM CHLORIDE 0.9 % IV BOLUS (SEPSIS)
1000.0000 mL | Freq: Once | INTRAVENOUS | Status: AC
  Administered 2015-12-31: 1000 mL via INTRAVENOUS

## 2015-12-31 NOTE — ED Notes (Signed)
Pt here for abd pain, elevated lipase and cyst on pancreas. sts some nausea.

## 2015-12-31 NOTE — Discharge Instructions (Signed)
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
## 2016-01-28 ENCOUNTER — Encounter (HOSPITAL_COMMUNITY): Payer: Self-pay | Admitting: *Deleted

## 2016-01-29 ENCOUNTER — Other Ambulatory Visit: Payer: Self-pay | Admitting: Gastroenterology
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
## 2016-02-10 ENCOUNTER — Other Ambulatory Visit: Payer: Self-pay | Admitting: Gastroenterology

## 2016-02-11 ENCOUNTER — Ambulatory Visit (HOSPITAL_COMMUNITY)
Admission: RE | Admit: 2016-02-11 | Discharge: 2016-02-11 | Disposition: A | Discharge status: Home / Self Care | Payer: Federal, State, Local not specified - PPO | Source: Ambulatory Visit | Attending: Gastroenterology | Admitting: Gastroenterology

## 2016-02-11 ENCOUNTER — Encounter (HOSPITAL_COMMUNITY): Payer: Self-pay | Admitting: *Deleted

## 2016-02-11 ENCOUNTER — Ambulatory Visit (HOSPITAL_COMMUNITY): Payer: Federal, State, Local not specified - PPO | Admitting: Anesthesiology

## 2016-02-11 ENCOUNTER — Encounter (HOSPITAL_COMMUNITY)
Admission: RE | Disposition: A | Discharge status: Home / Self Care | Payer: Self-pay | Source: Ambulatory Visit | Attending: Gastroenterology

## 2016-02-11 DIAGNOSIS — Z79899 Other long term (current) drug therapy: Secondary | ICD-10-CM | POA: Insufficient documentation

## 2016-02-11 DIAGNOSIS — Z980 Intestinal bypass and anastomosis status: Secondary | ICD-10-CM | POA: Diagnosis not present

## 2016-02-11 DIAGNOSIS — K861 Other chronic pancreatitis: Secondary | ICD-10-CM | POA: Insufficient documentation

## 2016-02-11 DIAGNOSIS — F419 Anxiety disorder, unspecified: Secondary | ICD-10-CM | POA: Diagnosis not present

## 2016-02-11 DIAGNOSIS — R109 Unspecified abdominal pain: Secondary | ICD-10-CM | POA: Diagnosis present

## 2016-02-11 DIAGNOSIS — F17210 Nicotine dependence, cigarettes, uncomplicated: Secondary | ICD-10-CM | POA: Insufficient documentation

## 2016-02-11 DIAGNOSIS — I10 Essential (primary) hypertension: Secondary | ICD-10-CM | POA: Insufficient documentation

## 2016-02-11 DIAGNOSIS — K862 Cyst of pancreas: Secondary | ICD-10-CM | POA: Insufficient documentation

## 2016-02-11 DIAGNOSIS — E119 Type 2 diabetes mellitus without complications: Secondary | ICD-10-CM | POA: Insufficient documentation

## 2016-02-11 DIAGNOSIS — Z7984 Long term (current) use of oral hypoglycemic drugs: Secondary | ICD-10-CM | POA: Insufficient documentation

## 2016-02-11 HISTORY — PX: EUS: SHX5427

## 2016-02-11 HISTORY — PX: FINE NEEDLE ASPIRATION: SHX5430

## 2016-02-11 HISTORY — DX: Dermatitis, unspecified: L30.9

## 2016-02-11 LAB — GLUCOSE, CAPILLARY: Glucose-Capillary: 148 mg/dL — ABNORMAL HIGH (ref 65–99)

## 2016-02-11 SURGERY — ESOPHAGEAL ENDOSCOPIC ULTRASOUND (EUS) RADIAL
Anesthesia: Monitor Anesthesia Care

## 2016-02-11 SURGERY — UPPER ENDOSCOPIC ULTRASOUND (EUS) RADIAL
Anesthesia: Monitor Anesthesia Care

## 2016-02-11 MED ORDER — PROPOFOL 10 MG/ML IV BOLUS
INTRAVENOUS | Status: AC
  Filled 2016-02-11: qty 60

## 2016-02-11 MED ORDER — BUPIVACAINE HCL (PF) 0.25 % IJ SOLN
INTRAMUSCULAR | Status: AC
  Filled 2016-02-11: qty 30

## 2016-02-11 MED ORDER — SODIUM CHLORIDE 0.9 % IV SOLN: INTRAVENOUS | Status: DC

## 2016-02-11 MED ORDER — PROPOFOL 500 MG/50ML IV EMUL
INTRAVENOUS | Status: DC | PRN
  Administered 2016-02-11: 200 ug/kg/min via INTRAVENOUS

## 2016-02-11 MED ORDER — ONDANSETRON HCL 4 MG/2ML IJ SOLN
INTRAMUSCULAR | Status: AC
  Filled 2016-02-11: qty 2

## 2016-02-11 MED ORDER — ONDANSETRON HCL 4 MG/2ML IJ SOLN
INTRAMUSCULAR | Status: DC | PRN
  Administered 2016-02-11: 4 mg via INTRAVENOUS

## 2016-02-11 MED ORDER — HYDROMORPHONE HCL 4 MG PO TABS: 4.0000 mg | ORAL_TABLET | Freq: Four times a day (QID) | ORAL | Status: DC | PRN

## 2016-02-11 MED ORDER — TRIAMCINOLONE ACETONIDE 40 MG/ML IJ SUSP
INTRAMUSCULAR | Status: AC
  Filled 2016-02-11: qty 1

## 2016-02-11 MED ORDER — LIDOCAINE HCL (CARDIAC) 20 MG/ML IV SOLN
INTRAVENOUS | Status: DC | PRN
  Administered 2016-02-11: 100 mg via INTRAVENOUS

## 2016-02-11 MED ORDER — MEPERIDINE HCL 100 MG/ML IJ SOLN: 6.2500 mg | INTRAMUSCULAR | Status: DC | PRN

## 2016-02-11 MED ORDER — PROPOFOL 10 MG/ML IV BOLUS
INTRAVENOUS | Status: AC
  Filled 2016-02-11: qty 20

## 2016-02-11 MED ORDER — LACTATED RINGERS IV SOLN
INTRAVENOUS | Status: DC
  Administered 2016-02-11 (×2): via INTRAVENOUS

## 2016-02-11 MED ORDER — ONDANSETRON HCL 4 MG/2ML IJ SOLN: 4.0000 mg | Freq: Once | INTRAMUSCULAR | Status: DC

## 2016-02-11 MED ORDER — TRIAMCINOLONE ACETONIDE 40 MG/ML IJ SUSP
INTRAMUSCULAR | Status: DC | PRN
  Administered 2016-02-11: 22 mL

## 2016-02-11 MED ORDER — PROPOFOL 10 MG/ML IV BOLUS
INTRAVENOUS | Status: DC | PRN
  Administered 2016-02-11 (×2): 20 mg via INTRAVENOUS
  Administered 2016-02-11: 40 mg via INTRAVENOUS
  Administered 2016-02-11: 20 mg via INTRAVENOUS
  Administered 2016-02-11: 40 mg via INTRAVENOUS
  Administered 2016-02-11: 20 mg via INTRAVENOUS
  Administered 2016-02-11: 40 mg via INTRAVENOUS

## 2016-02-11 MED ORDER — LIDOCAINE HCL (CARDIAC) 20 MG/ML IV SOLN
INTRAVENOUS | Status: AC
  Filled 2016-02-11: qty 5

## 2016-02-11 NOTE — Discharge Instructions (Signed)

## 2016-02-11 NOTE — Anesthesia Postprocedure Evaluation (Signed)
Anesthesia Post Note  Patient: Retta Dionesnthony Ghee  Procedure(s) Performed: Procedure(s) (LRB): ESOPHAGEAL ENDOSCOPIC ULTRASOUND (EUS) RADIAL (N/A) FINE NEEDLE ASPIRATION (FNA) RADIAL (N/A)  Patient location during evaluation: Endoscopy Anesthesia Type: MAC Level of consciousness: awake and alert Pain management: pain level controlled Vital Signs Assessment: post-procedure vital signs reviewed and stable Respiratory status: spontaneous breathing, nonlabored ventilation, respiratory function stable and patient connected to nasal cannula oxygen Cardiovascular status: stable and blood pressure returned to baseline Anesthetic complications: no    Last Vitals:  Filed Vitals:   02/11/16 1010 02/11/16 1020  BP: 133/70 125/59  Pulse: 82 70  Temp:    Resp: 14 14    Last Pain: There were no vitals filed for this visit.               Sebastian Acheheodore Leahanna Buser

## 2016-02-11 NOTE — Transfer of Care (Signed)
Immediate Anesthesia Transfer of Care Note  Patient: [name] [name]  Procedure(s) Performed: Procedure(s) with comments: ESOPHAGEAL ENDOSCOPIC ULTRASOUND (EUS) RADIAL (N/A) - possible celiac plexus block FINE NEEDLE ASPIRATION (FNA) RADIAL (N/A) - possible fna  Patient Location: ENDO  Anesthesia Type:MAC  Level of Consciousness:  sedated, patient cooperative and responds to stimulation  Airway & Oxygen Therapy:Patient Spontanous Breathing and Patient connected to face mask oxgen  Post-op Assessment:  Report given to ENDO RN and Post -op Vital signs reviewed and stable  Post vital signs:  Reviewed and stable  Last Vitals:  Filed Vitals:   02/11/16 0826 02/11/16 0954  BP: 131/73 125/66  Pulse: 75 86  Temp: 36.4 C   Resp: 10 15    Complications: No apparent anesthesia complications

## 2016-02-11 NOTE — H&P (Signed)
Patient interval history reviewed.  Patient examined again.  There has been no change from documented H/P dated 01/16/16 (scanned into chart from our office) except as documented above.  Assessment:  1.  Recurrent pancreatitis. 2.  Abdominal pain. 3.  Pancreatic cyst.  Plan:  1.  Endoscopic ultrasound.  Possible celiac plexus block.  Possible pancreatic cyst aspiration. 2.  Risks (bleeding, infection, bowel perforation that could require surgery, sedation-related changes in cardiopulmonary systems), benefits (identification and possible treatment of source of symptoms, exclusion of certain causes of symptoms), and alternatives (watchful waiting, radiographic imaging studies, empiric medical treatment) of upper endoscopy with ultrasound and possible cyst aspiration, biopsies, celiac plexus block (EUS +/- celiac block +/- cyst aspiration +/-FNA) were explained to patient/family in detail and patient wishes to proceed.

## 2016-02-11 NOTE — Op Note (Signed)
[facility] Patient Name: [name] [name] Procedure Date: 02/11/2016 MRN: [id number] Attending MD: Willis Modena , MD Date of Birth: 24-Jul-1971 CSN:  Age: 45 Admit Type: Outpatient Procedure:                Upper EUS Indications:              Chronic recurrent pancreatitis, Pancreatic cyst,                            Epigastric abdominal pain, Celiac plexus block for                            pain secondary to chronic pancreatitis Providers:                Willis Modena, MD, Dow Adolph, RN, Clearnce Sorrel,                            Technician, Waymond Cera, CRNA Referring MD:             Sherrie Mustache, MD Medicines:                Propofol per Anesthesia Complications:            No immediate complications. Estimated Blood Loss:     Estimated blood loss was minimal. Procedure:                Pre-Anesthesia Assessment:                           - Prior to the procedure, a History and Physical                            was performed, and patient medications and                            allergies were reviewed. The patient's tolerance of                            previous anesthesia was also reviewed. The risks                            and benefits of the procedure and the sedation                            options and risks were discussed with the patient.                            All questions were answered, and informed consent                            was obtained. Prior Anticoagulants: The patient has                            taken no previous anticoagulant or antiplatelet  agents. ASA Grade Assessment: III - A patient with                            severe systemic disease. After reviewing the risks                            and benefits, the patient was deemed in                            satisfactory condition to undergo the procedure.                           After obtaining informed consent, the endoscope was                      passed under direct vision. Throughout the                            procedure, the patient's blood pressure, pulse, and                            oxygen saturations were monitored continuously. The                            [id number] ([id number]) scope was introduced through                            the mouth, and advanced to the second part of                            duodenum. The upper EUS was accomplished without                            difficulty. The patient tolerated the procedure                            well. Scope In: Scope Out: Findings:      Endosonographic Finding :      There was no sign of significant endosonographic abnormality involving       the descending thoracic aorta. Celiac plexus block was performed. The       region of the celiac plexus and celiac ganglia was identified       endosonographically with Color Doppler imaging, using the take-off of       the celiac trunk from the anterior aspect of the aorta as the main       anatomical landmark. Color Doppler guidance was also used to confirm a       lack of significant vascular structures within the injection needle       path. Using a transgastric approach, a 20 gauge celiac plexus needle was       advanced to the area of the celiac ganglion. Needle aspiration was       performed prior to injection to exclude entry into a blood vessel. A       total of 20 mL of 0.25% bupivacaine and 80 mg of triamcinolone (40  mg/mL) were injected for the celiac plexus block. The needle was then       withdrawn.      An anechoic lesion suggestive of a cyst was identified in the pancreatic       tail. It is not in obvious communication with the pancreatic duct. The       lesion measured 16 mm by 12 mm in maximal cross-sectional diameter.       There was a single compartment thickly septated. The outer wall of the       lesion was not seen. There was no associated mass. There was no internal        debris within the fluid-filled cavity. Lots of vasculature anterior to       cyst was noted; cyst aspiration was felt to be risk-prohibitive.      Pancreatic parenchymal abnormalities were noted in the entire pancreas.       These consisted of hyperechoic strands, hyperechoic foci and lobularity.       These findings are consistent with chronic pancreatitis.      There was no sign of significant endosonographic abnormality in the       common bile duct, in the common hepatic duct and in the gallbladder. The       maximum diameter of the ducts were 5 mm. An unremarkable gallbladder was       identified.      There was abnormal echogenicity in the left lobe of the liver. This area       was hyperechoic.      There was no sign of significant endosonographic abnormality in the       anterior portion of the left kidney. No cysts were identified.      There was no sign of significant endosonographic abnormality in the       superior portion of the spleen. No abnormal echogenicity was identified. Impression:               - The descending thoracic aorta was                            endosonographically normal.                           - A cystic lesion was seen in the pancreatic tail.                            Tissue has not been obtained due to overlying                            vasculature anterior to cyst. 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                           - Pancreatic parenchymal abnormalities consisting  of hyperechoic strands, hyperechoic foci and                            lobularity were noted in the entire pancreas.                            Findings consistent with chronic pancreatitis.                            Suspect cause of pancreatitis is                            hypertriglyceridemia.                           - There was no sign of  significant pathology in the                            common bile duct, in the common hepatic duct and in                            the gallbladder.                           - There was abnormal echogenicity in the left lobe                            of the liver. This was hyperechoic. Tissue has not                            been obtained. However, the endosonographic                            appearance is consistent with fatty infiltration.                           - Celiac plexus block performed. Moderate Sedation:      N/A- Per Anesthesia Care Recommendation:           - Discharge patient to home (via wheelchair).                           - Low fat diet indefinitely.                           - Follow clinical response to celiac plexus block.                           - Return to GI clinic in 6 weeks.                           - Would repeat CT/MRI of pancreas in 1-2 years for                            cyst surveillance; if no interval change at  that                            time, could likely stop surveillance of the                            pancreatic cyst.                           - Return to referring physician as previously                            scheduled. Procedure Code(s):        --- Professional ---                           [phone], Esophagogastroduodenoscopy, flexible,                            transoral; with transendoscopic ultrasound-guided                            transmural injection of diagnostic or therapeutic                            substance(s) (eg, anesthetic, neurolytic agent) or                            fiducial marker(s) (includes endoscopic ultrasound                            examination of the esophagus, stomach, and either                            the duodenum or a surgically altered stomach where                            the jejunum is examined distal to the anastomosis) Diagnosis Code(s):        --- Professional ---                            U04.5, Cyst of pancreas                           K86.9, Disease of pancreas, unspecified                           K86.1, Other chronic pancreatitis                           R10.13, Epigastric pain                           R93.2, Abnormal findings on diagnostic imaging of                            liver and biliary tract CPT copyright 2016 American  Medical Association. All rights reserved. The codes documented in this report are preliminary and upon coder review may  be revised to meet current compliance requirements. Willis ModenaWilliam Kane Kusek, MD 02/11/2016 10:00:36 AM This report has been signed electronically. Number of Addenda: 0

## 2016-02-15 ENCOUNTER — Encounter (HOSPITAL_COMMUNITY): Payer: Self-pay | Admitting: Gastroenterology

## 2016-03-09 ENCOUNTER — Encounter (HOSPITAL_COMMUNITY): Payer: Self-pay

## 2016-03-09 ENCOUNTER — Emergency Department (HOSPITAL_COMMUNITY)
Admission: EM | Admit: 2016-03-09 | Discharge: 2016-03-09 | Disposition: A | Discharge status: Home / Self Care | Payer: Federal, State, Local not specified - PPO | Attending: Emergency Medicine | Admitting: Emergency Medicine

## 2016-03-09 DIAGNOSIS — R739 Hyperglycemia, unspecified: Secondary | ICD-10-CM

## 2016-03-09 DIAGNOSIS — L089 Local infection of the skin and subcutaneous tissue, unspecified: Secondary | ICD-10-CM | POA: Insufficient documentation

## 2016-03-09 DIAGNOSIS — Z7984 Long term (current) use of oral hypoglycemic drugs: Secondary | ICD-10-CM | POA: Insufficient documentation

## 2016-03-09 DIAGNOSIS — E1165 Type 2 diabetes mellitus with hyperglycemia: Secondary | ICD-10-CM | POA: Insufficient documentation

## 2016-03-09 DIAGNOSIS — Z792 Long term (current) use of antibiotics: Secondary | ICD-10-CM | POA: Insufficient documentation

## 2016-03-09 DIAGNOSIS — F17210 Nicotine dependence, cigarettes, uncomplicated: Secondary | ICD-10-CM | POA: Insufficient documentation

## 2016-03-09 DIAGNOSIS — K861 Other chronic pancreatitis: Secondary | ICD-10-CM | POA: Insufficient documentation

## 2016-03-09 DIAGNOSIS — T148XXA Other injury of unspecified body region, initial encounter: Secondary | ICD-10-CM

## 2016-03-09 DIAGNOSIS — I10 Essential (primary) hypertension: Secondary | ICD-10-CM | POA: Insufficient documentation

## 2016-03-09 DIAGNOSIS — L97119 Non-pressure chronic ulcer of right thigh with unspecified severity: Secondary | ICD-10-CM | POA: Insufficient documentation

## 2016-03-09 DIAGNOSIS — Z79891 Long term (current) use of opiate analgesic: Secondary | ICD-10-CM | POA: Insufficient documentation

## 2016-03-09 LAB — CBC WITH DIFFERENTIAL/PLATELET
Basophils Absolute: 0 10*3/uL (ref 0.0–0.1)
Basophils Relative: 0 %
EOS ABS: 0.2 10*3/uL (ref 0.0–0.7)
EOS PCT: 3 %
HCT: 40.3 % (ref 39.0–52.0)
Hemoglobin: 14.1 g/dL (ref 13.0–17.0)
LYMPHS ABS: 1.4 10*3/uL (ref 0.7–4.0)
Lymphocytes Relative: 18 %
MCH: 30.8 pg (ref 26.0–34.0)
MCHC: 35 g/dL (ref 30.0–36.0)
MCV: 88 fL (ref 78.0–100.0)
MONO ABS: 0.5 10*3/uL (ref 0.1–1.0)
Monocytes Relative: 7 %
Neutro Abs: 5.6 10*3/uL (ref 1.7–7.7)
Neutrophils Relative %: 72 %
PLATELETS: 207 10*3/uL (ref 150–400)
RBC: 4.58 MIL/uL (ref 4.22–5.81)
RDW: 14.1 % (ref 11.5–15.5)
WBC: 7.7 10*3/uL (ref 4.0–10.5)

## 2016-03-09 LAB — BASIC METABOLIC PANEL
Anion gap: 9 (ref 5–15)
BUN: 16 mg/dL (ref 6–20)
CHLORIDE: 103 mmol/L (ref 101–111)
CO2: 25 mmol/L (ref 22–32)
CREATININE: 0.82 mg/dL (ref 0.61–1.24)
Calcium: 8.7 mg/dL — ABNORMAL LOW (ref 8.9–10.3)
GFR calc Af Amer: >60 mL/min (ref >60)
GFR calc non Af Amer: >60 mL/min (ref >60)
GLUCOSE: 251 mg/dL — AB (ref 65–99)
Potassium: 3.5 mmol/L (ref 3.5–5.1)
SODIUM: 137 mmol/L (ref 135–145)

## 2016-03-09 LAB — CBG MONITORING, ED: GLUCOSE-CAPILLARY: 173 mg/dL — AB (ref 65–99)

## 2016-03-09 LAB — I-STAT CG4 LACTIC ACID, ED
LACTIC ACID, VENOUS: 2.13 mmol/L — AB (ref 0.5–2.0)
Lactic Acid, Venous: 1.95 mmol/L (ref 0.5–2.0)

## 2016-03-09 LAB — LIPASE, BLOOD: Lipase: 88 U/L — ABNORMAL HIGH (ref 11–51)

## 2016-03-09 MED ORDER — SODIUM CHLORIDE 0.9 % IV BOLUS (SEPSIS)
30.0000 mL/kg | Freq: Once | INTRAVENOUS | Status: AC
  Administered 2016-03-09: 2994 mL via INTRAVENOUS

## 2016-03-09 MED ORDER — HYDROMORPHONE HCL 1 MG/ML IJ SOLN
1.0000 mg | Freq: Once | INTRAMUSCULAR | Status: AC
  Administered 2016-03-09: 1 mg via INTRAVENOUS
  Filled 2016-03-09: qty 1

## 2016-03-09 MED ORDER — OXYCODONE-ACETAMINOPHEN 5-325 MG PO TABS: 1.0000 | ORAL_TABLET | ORAL | Status: DC | PRN

## 2016-03-09 MED ORDER — CLINDAMYCIN HCL 300 MG PO CAPS: 300.0000 mg | ORAL_CAPSULE | Freq: Four times a day (QID) | ORAL | Status: DC

## 2016-03-09 MED ORDER — ONDANSETRON HCL 4 MG/2ML IJ SOLN
4.0000 mg | Freq: Once | INTRAMUSCULAR | Status: AC
  Administered 2016-03-09: 4 mg via INTRAVENOUS
  Filled 2016-03-09: qty 2

## 2016-03-09 MED ORDER — SODIUM CHLORIDE 0.9 % IV BOLUS (SEPSIS)
500.0000 mL | Freq: Once | INTRAVENOUS | Status: AC
Start: 2016-03-09 — End: 2016-03-09
  Administered 2016-03-09: 500 mL via INTRAVENOUS

## 2016-03-09 NOTE — ED Notes (Signed)
Pt here with wound to left leg/stump.  Pt has hx of MRSA 7 years ago.  Pt with no fever.  Pt states urine is also discolored but no burning with urination or urinary changes.
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
## 2016-03-09 NOTE — Discharge Instructions (Signed)
Avoid wearing the prosthesis, to allow the wound on your left leg to heal. Soak the left leg in warm water or use a warm compress on it 3 times a day until the wound heals. Keep the wound clean with soap and water daily. Keep a light bandage on the wound to protect it. Drink plenty of fluids.   Hyperglycemia High blood sugar (hyperglycemia) means that the level of sugar in your blood is higher than it should be. Signs of high blood sugar include:  Feeling thirsty.  Frequent peeing (urinating).  Feeling tired or sleepy.  Dry mouth.  Vision changes.  Feeling weak.  Feeling hungry but losing weight.  Numbness and tingling in your hands or feet.  Headache. When you ignore these signs, your blood sugar may keep going up. These problems may get worse, and other problems may begin. HOME CARE  Check your blood sugars as told by your doctor. Write down the numbers with the date and time.  Take the right amount of insulin or diabetes pills at the right time. Write down the dose with date and time.  Refill your insulin or diabetes pills before running out.  Watch what you eat. Follow your meal plan.  Drink liquids without sugar, such as water. Check with your doctor if you have kidney or heart disease.  Follow your doctor's orders for exercise. Exercise at the same time of day.  Keep your doctor's appointments. GET HELP RIGHT AWAY IF:   You have trouble thinking or are confused.  You have fast breathing with fruity smelling breath.  You pass out (faint).  You have 2 to 3 days of high blood sugars and you do not know why.  You have chest pain.  You are feeling sick to your stomach (nauseous) or throwing up (vomiting).  You have sudden vision changes. MAKE SURE YOU:   Understand these instructions.  Will watch your condition.  Will get help right away if you are not doing well or get worse.   This information is not intended to replace advice given to you by your  health care provider. Make sure you discuss any questions you have with your health care provider.   Document Released: 07/25/2009 Document Revised: 10/18/2014 Document Reviewed: 06/03/2015 Elsevier Interactive Patient Education [employer]2016 [employer] Inc.

## 2016-03-09 NOTE — ED Notes (Signed)
Requested urine x2 states he is unable to void

## 2016-03-14 LAB — CULTURE, BLOOD (ROUTINE X 2)
CULTURE: NO GROWTH
Culture: NO GROWTH

## 2016-05-01 ENCOUNTER — Observation Stay (HOSPITAL_COMMUNITY)
Admission: EM | Admit: 2016-05-01 | Discharge: 2016-05-02 | DRG: 440 | Discharge status: Against Medical Advice | Payer: Federal, State, Local not specified - PPO | Attending: Internal Medicine | Admitting: Internal Medicine

## 2016-05-01 ENCOUNTER — Encounter (HOSPITAL_COMMUNITY): Payer: Self-pay

## 2016-05-01 DIAGNOSIS — E118 Type 2 diabetes mellitus with unspecified complications: Secondary | ICD-10-CM

## 2016-05-01 DIAGNOSIS — I10 Essential (primary) hypertension: Secondary | ICD-10-CM | POA: Diagnosis present

## 2016-05-01 DIAGNOSIS — Z79899 Other long term (current) drug therapy: Secondary | ICD-10-CM | POA: Diagnosis not present

## 2016-05-01 DIAGNOSIS — Z7984 Long term (current) use of oral hypoglycemic drugs: Secondary | ICD-10-CM | POA: Diagnosis not present

## 2016-05-01 DIAGNOSIS — K8681 Exocrine pancreatic insufficiency: Secondary | ICD-10-CM | POA: Diagnosis present

## 2016-05-01 DIAGNOSIS — L309 Dermatitis, unspecified: Secondary | ICD-10-CM | POA: Diagnosis present

## 2016-05-01 DIAGNOSIS — Z89512 Acquired absence of left leg below knee: Secondary | ICD-10-CM | POA: Diagnosis not present

## 2016-05-01 DIAGNOSIS — K861 Other chronic pancreatitis: Secondary | ICD-10-CM | POA: Diagnosis not present

## 2016-05-01 DIAGNOSIS — R109 Unspecified abdominal pain: Secondary | ICD-10-CM | POA: Diagnosis present

## 2016-05-01 DIAGNOSIS — F17210 Nicotine dependence, cigarettes, uncomplicated: Secondary | ICD-10-CM | POA: Diagnosis not present

## 2016-05-01 DIAGNOSIS — E1165 Type 2 diabetes mellitus with hyperglycemia: Secondary | ICD-10-CM | POA: Diagnosis not present

## 2016-05-01 DIAGNOSIS — F419 Anxiety disorder, unspecified: Secondary | ICD-10-CM | POA: Diagnosis not present

## 2016-05-01 DIAGNOSIS — IMO0002 Reserved for concepts with insufficient information to code with codable children: Secondary | ICD-10-CM | POA: Diagnosis present

## 2016-05-01 DIAGNOSIS — E876 Hypokalemia: Secondary | ICD-10-CM | POA: Diagnosis not present

## 2016-05-01 DIAGNOSIS — F1910 Other psychoactive substance abuse, uncomplicated: Secondary | ICD-10-CM | POA: Diagnosis not present

## 2016-05-01 DIAGNOSIS — G8929 Other chronic pain: Secondary | ICD-10-CM | POA: Diagnosis not present

## 2016-05-01 DIAGNOSIS — K859 Acute pancreatitis without necrosis or infection, unspecified: Principal | ICD-10-CM | POA: Diagnosis present

## 2016-05-01 DIAGNOSIS — Z765 Malingerer [conscious simulation]: Secondary | ICD-10-CM | POA: Diagnosis not present

## 2016-05-01 DIAGNOSIS — R451 Restlessness and agitation: Secondary | ICD-10-CM | POA: Diagnosis not present

## 2016-05-01 DIAGNOSIS — E1140 Type 2 diabetes mellitus with diabetic neuropathy, unspecified: Secondary | ICD-10-CM | POA: Diagnosis present

## 2016-05-01 LAB — COMPREHENSIVE METABOLIC PANEL
ALBUMIN: 4.2 g/dL (ref 3.5–5.0)
ALT: 26 U/L (ref 17–63)
AST: 20 U/L (ref 15–41)
Alkaline Phosphatase: 42 U/L (ref 38–126)
Anion gap: 9 (ref 5–15)
BILIRUBIN TOTAL: 0.7 mg/dL (ref 0.3–1.2)
BUN: 20 mg/dL (ref 6–20)
CHLORIDE: 105 mmol/L (ref 101–111)
CO2: 24 mmol/L (ref 22–32)
CREATININE: 0.81 mg/dL (ref 0.61–1.24)
Calcium: 9.3 mg/dL (ref 8.9–10.3)
GFR calc Af Amer: >60 mL/min (ref >60)
GLUCOSE: 208 mg/dL — AB (ref 65–99)
POTASSIUM: 3.4 mmol/L — AB (ref 3.5–5.1)
Sodium: 138 mmol/L (ref 135–145)
Total Protein: 6.9 g/dL (ref 6.5–8.1)

## 2016-05-01 LAB — CBC WITH DIFFERENTIAL/PLATELET
Basophils Absolute: 0 10*3/uL (ref 0.0–0.1)
Basophils Relative: 0 %
EOS PCT: 2 %
Eosinophils Absolute: 0.1 10*3/uL (ref 0.0–0.7)
HEMATOCRIT: 40.4 % (ref 39.0–52.0)
Hemoglobin: 14.1 g/dL (ref 13.0–17.0)
LYMPHS PCT: 28 %
Lymphs Abs: 2 10*3/uL (ref 0.7–4.0)
MCH: 31.8 pg (ref 26.0–34.0)
MCHC: 34.9 g/dL (ref 30.0–36.0)
MCV: 91 fL (ref 78.0–100.0)
MONO ABS: 0.4 10*3/uL (ref 0.1–1.0)
MONOS PCT: 6 %
NEUTROS ABS: 4.5 10*3/uL (ref 1.7–7.7)
Neutrophils Relative %: 64 %
PLATELETS: 227 10*3/uL (ref 150–400)
RBC: 4.44 MIL/uL (ref 4.22–5.81)
RDW: 13.8 % (ref 11.5–15.5)
WBC: 7 10*3/uL (ref 4.0–10.5)

## 2016-05-01 LAB — URINALYSIS, ROUTINE W REFLEX MICROSCOPIC
BILIRUBIN URINE: NEGATIVE
GLUCOSE, UA: 100 mg/dL — AB
HGB URINE DIPSTICK: NEGATIVE
Ketones, ur: NEGATIVE mg/dL
Leukocytes, UA: NEGATIVE
Nitrite: NEGATIVE
PH: 6.5 (ref 5.0–8.0)
PROTEIN: NEGATIVE mg/dL
Specific Gravity, Urine: 1.011 (ref 1.005–1.030)

## 2016-05-01 LAB — LIPID PANEL
Cholesterol: 170 mg/dL (ref 0–200)
HDL: 33 mg/dL — AB (ref >40)
LDL CALC: 89 mg/dL (ref 0–99)
TRIGLYCERIDES: 242 mg/dL — AB (ref <150)
Total CHOL/HDL Ratio: 5.2 RATIO
VLDL: 48 mg/dL — ABNORMAL HIGH (ref 0–40)

## 2016-05-01 LAB — GLUCOSE, CAPILLARY: Glucose-Capillary: 201 mg/dL — ABNORMAL HIGH (ref 65–99)

## 2016-05-01 LAB — MAGNESIUM: MAGNESIUM: 1.6 mg/dL — AB (ref 1.7–2.4)

## 2016-05-01 LAB — LIPASE, BLOOD: LIPASE: 316 U/L — AB (ref 11–51)

## 2016-05-01 LAB — CBG MONITORING, ED: GLUCOSE-CAPILLARY: 195 mg/dL — AB (ref 65–99)

## 2016-05-01 MED ORDER — HYDROMORPHONE 1 MG/ML IV SOLN
INTRAVENOUS | Status: DC
  Administered 2016-05-01: 19:00:00 via INTRAVENOUS
  Filled 2016-05-01: qty 25

## 2016-05-01 MED ORDER — POLYETHYLENE GLYCOL 3350 17 G PO PACK: 17.0000 g | PACK | Freq: Every day | ORAL | Status: DC | PRN

## 2016-05-01 MED ORDER — INSULIN ASPART 100 UNIT/ML ~~LOC~~ SOLN
0.0000 [IU] | SUBCUTANEOUS | Status: DC
Start: 2016-05-01 — End: 2016-05-02

## 2016-05-01 MED ORDER — DIPHENHYDRAMINE HCL 50 MG/ML IJ SOLN: 12.5000 mg | Freq: Four times a day (QID) | INTRAMUSCULAR | Status: DC | PRN

## 2016-05-01 MED ORDER — DIPHENHYDRAMINE HCL 12.5 MG/5ML PO ELIX: 12.5000 mg | ORAL_SOLUTION | Freq: Four times a day (QID) | ORAL | Status: DC | PRN

## 2016-05-01 MED ORDER — LORAZEPAM 2 MG/ML IJ SOLN
1.0000 mg | Freq: Once | INTRAMUSCULAR | Status: AC
  Administered 2016-05-01: 1 mg via INTRAVENOUS
  Filled 2016-05-01: qty 1

## 2016-05-01 MED ORDER — SODIUM CHLORIDE 0.9% FLUSH: 9.0000 mL | INTRAVENOUS | Status: DC | PRN

## 2016-05-01 MED ORDER — ONDANSETRON HCL 4 MG/2ML IJ SOLN
4.0000 mg | Freq: Four times a day (QID) | INTRAMUSCULAR | Status: DC | PRN
Start: 2016-05-01 — End: 2016-05-02
  Filled 2016-05-01: qty 2

## 2016-05-01 MED ORDER — SODIUM CHLORIDE 0.9 % IV BOLUS (SEPSIS)
1000.0000 mL | Freq: Once | INTRAVENOUS | Status: AC
  Administered 2016-05-01: 1000 mL via INTRAVENOUS

## 2016-05-01 MED ORDER — ONDANSETRON HCL 4 MG/2ML IJ SOLN: 4.0000 mg | Freq: Four times a day (QID) | INTRAMUSCULAR | Status: DC | PRN

## 2016-05-01 MED ORDER — HYDROMORPHONE HCL 1 MG/ML IJ SOLN
1.0000 mg | INTRAMUSCULAR | Status: DC | PRN
  Administered 2016-05-01: 1 mg via INTRAVENOUS
  Filled 2016-05-01: qty 1

## 2016-05-01 MED ORDER — HYDROMORPHONE HCL 1 MG/ML IJ SOLN
1.0000 mg | INTRAMUSCULAR | Status: AC | PRN
  Administered 2016-05-01 (×2): 1 mg via INTRAVENOUS
  Filled 2016-05-01 (×2): qty 1

## 2016-05-01 MED ORDER — ONDANSETRON HCL 4 MG PO TABS: 4.0000 mg | ORAL_TABLET | Freq: Four times a day (QID) | ORAL | Status: DC | PRN

## 2016-05-01 MED ORDER — NALOXONE HCL 0.4 MG/ML IJ SOLN
0.4000 mg | INTRAMUSCULAR | Status: DC | PRN
Start: 2016-05-01 — End: 2016-05-02

## 2016-05-01 MED ORDER — LISINOPRIL 20 MG PO TABS
40.0000 mg | ORAL_TABLET | Freq: Every day | ORAL | Status: DC
Start: 2016-05-02 — End: 2016-05-02

## 2016-05-01 MED ORDER — PANCRELIPASE (LIP-PROT-AMYL) 12000-38000 UNITS PO CPEP
12000.0000 [IU] | ORAL_CAPSULE | Freq: Three times a day (TID) | ORAL | Status: DC
  Filled 2016-05-01 (×3): qty 1

## 2016-05-01 MED ORDER — SODIUM CHLORIDE 0.9 % IV SOLN
INTRAVENOUS | Status: DC
  Administered 2016-05-01: 17:00:00 via INTRAVENOUS

## 2016-05-01 MED ORDER — POTASSIUM CHLORIDE IN NACL 20-0.45 MEQ/L-% IV SOLN
INTRAVENOUS | Status: DC
  Administered 2016-05-01: 20:00:00 via INTRAVENOUS
  Filled 2016-05-01 (×2): qty 1000

## 2016-05-01 MED ORDER — ENOXAPARIN SODIUM 40 MG/0.4ML ~~LOC~~ SOLN
40.0000 mg | SUBCUTANEOUS | Status: DC
  Filled 2016-05-01: qty 0.4

## 2016-05-01 NOTE — ED Notes (Signed)
Patient states, "I am constantly sick. I have a poor quality of life." I have pancreatitis and an infection of my left leg." patient reports that he has infection of the stump on his left leg.

## 2016-05-01 NOTE — ED Notes (Signed)
Patient flagged this nurse into the room. Patient IV had come out when he attempted to stand and urinate. IV fluids stopped, the IV catheter was discarded. NT Marchelle Folks was requested to assist with collection of urine, if needed, and changing linen. Primary nurse notified by radio that patient IV had come out.

## 2016-05-01 NOTE — ED Notes (Signed)
RN at bedside collecting labs 
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
## 2016-05-01 NOTE — ED Notes (Signed)
He is questioning if our E.D.P. Can write pain rx--he has app't. With pain clinic in ~ 4 weeks.  I inform him I am somewhat dubious of this, but will make the doctor Lynelle Doctor) aware.
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
## 2016-05-01 NOTE — ED Notes (Signed)
I have just given report to [place], RN on [address].  I will re-start IV before tx to floor.

## 2016-05-01 NOTE — Progress Notes (Signed)
Patient arrived to floor.  A&O x4.  Asking for the PCA to be connected.  Reports pain 9/10 abdomen.  Room air.  Walking around room and into hall.

## 2016-05-01 NOTE — ED Notes (Signed)
I have given patient explanation that he will be the next one to be roomed as soon as a room is available.  Patient is continually complaining of not feeling good and that they are a diabetic, this writer has addressed and assessed for diabetic emergencies and explained that as soon as we are able to get patient back

## 2016-05-01 NOTE — ED Notes (Signed)
He continues to be in nsr and is comfortable.  Dr. Patria Mane has just spoken with pt. And his wife.

## 2016-05-02 DIAGNOSIS — F1910 Other psychoactive substance abuse, uncomplicated: Secondary | ICD-10-CM

## 2016-05-02 DIAGNOSIS — Z765 Malingerer [conscious simulation]: Secondary | ICD-10-CM

## 2016-05-02 DIAGNOSIS — K859 Acute pancreatitis without necrosis or infection, unspecified: Secondary | ICD-10-CM | POA: Diagnosis not present

## 2016-05-02 DIAGNOSIS — I10 Essential (primary) hypertension: Secondary | ICD-10-CM | POA: Diagnosis not present

## 2016-05-02 DIAGNOSIS — E118 Type 2 diabetes mellitus with unspecified complications: Secondary | ICD-10-CM | POA: Diagnosis not present

## 2016-05-02 LAB — BASIC METABOLIC PANEL
ANION GAP: 9 (ref 5–15)
BUN: 13 mg/dL (ref 6–20)
CALCIUM: 9.1 mg/dL (ref 8.9–10.3)
CO2: 23 mmol/L (ref 22–32)
CREATININE: 0.57 mg/dL — AB (ref 0.61–1.24)
Chloride: 105 mmol/L (ref 101–111)
Glucose, Bld: 176 mg/dL — ABNORMAL HIGH (ref 65–99)
Potassium: 3.9 mmol/L (ref 3.5–5.1)
SODIUM: 137 mmol/L (ref 135–145)

## 2016-05-02 LAB — GLUCOSE, CAPILLARY
GLUCOSE-CAPILLARY: 141 mg/dL — AB (ref 65–99)
Glucose-Capillary: 191 mg/dL — ABNORMAL HIGH (ref 65–99)
Glucose-Capillary: 203 mg/dL — ABNORMAL HIGH (ref 65–99)

## 2016-05-02 MED ORDER — ALPRAZOLAM 0.5 MG PO TABS: 0.5000 mg | ORAL_TABLET | Freq: Once | ORAL | Status: DC

## 2016-05-02 MED ORDER — LORAZEPAM 2 MG/ML IJ SOLN
1.0000 mg | Freq: Once | INTRAMUSCULAR | Status: AC
  Administered 2016-05-02: 1 mg via INTRAVENOUS
  Filled 2016-05-02: qty 1

## 2016-05-02 NOTE — Discharge Summary (Addendum)
Physician Discharge Summary  [name] [name] IHK:[id number] DOB: 1971/09/28 DOA: 05/01/2016  PCP: Arlean Hopping, MD  Admit date: 05/01/2016 Discharge date: 05/02/2016     LEFT AMA Time spent: 35 minutes  Recommendations for Outpatient Follow-up:  1. Follow up with GI and pain clinic as an outpatient.   Discharge Diagnoses:  Principal Problem:   Acute pancreatitis Active Problems:   Drug-seeking behavior   Diabetes mellitus type 2, uncontrolled (HCC)   Essential hypertension   Hypokalemia   Discharge Condition: stable  Diet recommendation: regular  Filed Weights   05/01/16 1827  Weight: 95.3 kg (210 lb)    History of present illness:  45 year old with past medical history of chronic back otitis followed by Dr. Dulce Sellar is an outpatient that comes to the ED with severe abdominal pain.  Hospital Course:  Acute on chronic pancreatitis/Drug Seeking behavior: Overall unclear etiology, he's had a thorough workup as an outpatient, he was admitted to the hospital place nothing by mouth PCA pump and IV fluids. All through the night the patient was getting diagnosis a hard time, the patient became agitated belligerent and offensive to the nurses relating that he was leaving the hospital relating that he was leaving AGAINST MEDICAL ADVICE, came and evaluated the patient and he was falling asleep during our conversation.  I related to him that the treatment of pancreatitis was keeping him nothing by mouth pain control and IV fluids. I had a long conversation with the patient about this but he was still defiant and belligerent to me. He called me and asked that he was walking that out the door.  Uncontrolled diabetes mellitus: His INR changes were made.  Essential hypertension: No changes were made.  Hypokalemia: It was repleted, magnesium was checked was low and he did not allow magnesium repletion.  Procedures:  None  Consultations:  none  Discharge Exam: Vitals:    05/02/16 0400 05/02/16 0646  BP:  117/80  Pulse:  63  Resp: 16 16  Temp:  97.8 F (36.6 C)    General: A&O x3 Cardiovascular: RRR Respiratory: good air movement CTA B/L  Discharge Instructions   Discharge Instructions    Diet - low sodium heart healthy    Complete by:  As directed   Increase activity slowly    Complete by:  As directed     Discharge Medication List as of 05/02/2016  8:24 AM    CONTINUE these medications which have NOT CHANGED   Details  omega-3 acid ethyl esters (LOVAZA) 1 g capsule Take 1 g by mouth 4 (four) times daily - after meals and at bedtime., Until Discontinued, Historical Med    oxyCODONE-acetaminophen (PERCOCET) 5-325 MG tablet Take 1 tablet by mouth every 4 (four) hours as needed for severe pain., Starting 03/09/2016, Until Discontinued, Print      STOP taking these medications     ALPRAZolam (XANAX) 0.5 MG tablet      clindamycin (CLEOCIN) 300 MG capsule      furosemide (LASIX) 20 MG tablet      hydrochlorothiazide (HYDRODIURIL) 25 MG tablet      HYDROmorphone (DILAUDID) 4 MG tablet      lisinopril (PRINIVIL,ZESTRIL) 40 MG tablet      metFORMIN (GLUCOPHAGE) 1000 MG tablet        No Known Allergies    The results of significant diagnostics from this hospitalization (including imaging, microbiology, ancillary and laboratory) are listed below for reference.    Significant Diagnostic Studies: No results found.  Microbiology:  No results found for this or any previous visit (from the past 240 hour(s)).   Labs: Basic Metabolic Panel:  Recent Labs Lab 05/01/16 1509 05/01/16 1940 05/02/16 0545  NA 138  --  137  K 3.4*  --  3.9  CL 105  --  105  CO2 24  --  23  GLUCOSE 208*  --  176*  BUN 20  --  13  CREATININE 0.81  --  0.57*  CALCIUM 9.3  --  9.1  MG  --  1.6*  --    Liver Function Tests:  Recent Labs Lab 05/01/16 1509  AST 20  ALT 26  ALKPHOS 42  BILITOT 0.7  PROT 6.9  ALBUMIN 4.2    Recent Labs Lab  05/01/16 1509  LIPASE 316*   No results for input(s): AMMONIA in the last 168 hours. CBC:  Recent Labs Lab 05/01/16 1509  WBC 7.0  NEUTROABS 4.5  HGB 14.1  HCT 40.4  MCV 91.0  PLT 227   Cardiac Enzymes: No results for input(s): CKTOTAL, CKMB, CKMBINDEX, TROPONINI in the last 168 hours. BNP: BNP (last 3 results) No results for input(s): BNP in the last 8760 hours.  ProBNP (last 3 results) No results for input(s): PROBNP in the last 8760 hours.  CBG:  Recent Labs Lab 05/01/16 1409 05/01/16 2057 05/02/16 0013 05/02/16 0349 05/02/16 0758  GLUCAP 195* 201* 203* 191* 141*       Signed:  Marinda Elk MD.  [facility] 05/02/2016, 8:33 AM

## 2016-05-02 NOTE — Progress Notes (Signed)
Increased anxiety/agitation throughout shift.  Moderate relief with ativan.  Reviewed plan of care with patient throughout shift.  Very poor insight, easily frustrated/irratated

## 2016-05-02 NOTE — Progress Notes (Signed)
Patient agitated, irritated, and wants to leave AMA.  Notified physician.  Patient signed AMA paper, physician arrived to floor and spoke to patient in patient room.  Security notified statement yesterday by patient of guns in the car.  Patient walked to Emergency Department exit accompanied by nurse stating his wife is picking him up.

## 2016-07-30 DIAGNOSIS — K080 Exfoliation of teeth due to systemic causes: Secondary | ICD-10-CM | POA: Diagnosis not present

## 2016-08-17 DIAGNOSIS — R5383 Other fatigue: Secondary | ICD-10-CM | POA: Diagnosis not present

## 2016-08-17 DIAGNOSIS — F419 Anxiety disorder, unspecified: Secondary | ICD-10-CM | POA: Diagnosis not present

## 2016-08-17 DIAGNOSIS — I158 Other secondary hypertension: Secondary | ICD-10-CM | POA: Diagnosis not present

## 2016-08-17 DIAGNOSIS — K861 Other chronic pancreatitis: Secondary | ICD-10-CM | POA: Diagnosis not present

## 2016-08-17 DIAGNOSIS — E119 Type 2 diabetes mellitus without complications: Secondary | ICD-10-CM | POA: Diagnosis not present

## 2016-08-17 DIAGNOSIS — K859 Acute pancreatitis without necrosis or infection, unspecified: Secondary | ICD-10-CM | POA: Diagnosis not present

## 2016-08-17 DIAGNOSIS — D45 Polycythemia vera: Secondary | ICD-10-CM | POA: Diagnosis not present

## 2016-08-17 DIAGNOSIS — E559 Vitamin D deficiency, unspecified: Secondary | ICD-10-CM | POA: Diagnosis not present

## 2016-08-27 DIAGNOSIS — G8921 Chronic pain due to trauma: Secondary | ICD-10-CM | POA: Diagnosis not present

## 2016-09-29 DIAGNOSIS — Z89512 Acquired absence of left leg below knee: Secondary | ICD-10-CM | POA: Diagnosis not present

## 2016-09-30 DIAGNOSIS — K859 Acute pancreatitis without necrosis or infection, unspecified: Secondary | ICD-10-CM | POA: Diagnosis not present

## 2016-09-30 DIAGNOSIS — K861 Other chronic pancreatitis: Secondary | ICD-10-CM | POA: Diagnosis not present

## 2016-09-30 DIAGNOSIS — R112 Nausea with vomiting, unspecified: Secondary | ICD-10-CM | POA: Diagnosis not present

## 2016-09-30 DIAGNOSIS — E119 Type 2 diabetes mellitus without complications: Secondary | ICD-10-CM | POA: Diagnosis not present

## 2016-09-30 DIAGNOSIS — F17210 Nicotine dependence, cigarettes, uncomplicated: Secondary | ICD-10-CM | POA: Diagnosis not present

## 2016-09-30 DIAGNOSIS — I10 Essential (primary) hypertension: Secondary | ICD-10-CM | POA: Diagnosis not present

## 2016-09-30 DIAGNOSIS — R748 Abnormal levels of other serum enzymes: Secondary | ICD-10-CM | POA: Diagnosis not present

## 2016-10-01 ENCOUNTER — Encounter (HOSPITAL_COMMUNITY): Payer: Self-pay

## 2016-10-01 ENCOUNTER — Emergency Department (HOSPITAL_COMMUNITY)
Admission: EM | Admit: 2016-10-01 | Discharge: 2016-10-01 | Disposition: A | Discharge status: Home / Self Care | Payer: Federal, State, Local not specified - PPO | Attending: Emergency Medicine | Admitting: Emergency Medicine

## 2016-10-01 DIAGNOSIS — Z7984 Long term (current) use of oral hypoglycemic drugs: Secondary | ICD-10-CM | POA: Insufficient documentation

## 2016-10-01 DIAGNOSIS — K859 Acute pancreatitis without necrosis or infection, unspecified: Secondary | ICD-10-CM | POA: Diagnosis not present

## 2016-10-01 DIAGNOSIS — Z79899 Other long term (current) drug therapy: Secondary | ICD-10-CM | POA: Insufficient documentation

## 2016-10-01 DIAGNOSIS — E119 Type 2 diabetes mellitus without complications: Secondary | ICD-10-CM | POA: Diagnosis not present

## 2016-10-01 DIAGNOSIS — R1012 Left upper quadrant pain: Secondary | ICD-10-CM | POA: Diagnosis not present

## 2016-10-01 DIAGNOSIS — Z8719 Personal history of other diseases of the digestive system: Secondary | ICD-10-CM | POA: Insufficient documentation

## 2016-10-01 DIAGNOSIS — F17210 Nicotine dependence, cigarettes, uncomplicated: Secondary | ICD-10-CM | POA: Diagnosis not present

## 2016-10-01 DIAGNOSIS — I10 Essential (primary) hypertension: Secondary | ICD-10-CM | POA: Insufficient documentation

## 2016-10-01 LAB — CBC
HEMATOCRIT: 43 % (ref 39.0–52.0)
Hemoglobin: 15.2 g/dL (ref 13.0–17.0)
MCH: 31 pg (ref 26.0–34.0)
MCHC: 35.3 g/dL (ref 30.0–36.0)
MCV: 87.8 fL (ref 78.0–100.0)
Platelets: 269 10*3/uL (ref 150–400)
RBC: 4.9 MIL/uL (ref 4.22–5.81)
RDW: 14.6 % (ref 11.5–15.5)
WBC: 9.4 10*3/uL (ref 4.0–10.5)

## 2016-10-01 LAB — COMPREHENSIVE METABOLIC PANEL
ALBUMIN: 4.2 g/dL (ref 3.5–5.0)
ALT: 28 U/L (ref 17–63)
AST: 28 U/L (ref 15–41)
Alkaline Phosphatase: 40 U/L (ref 38–126)
Anion gap: 11 (ref 5–15)
BUN: 12 mg/dL (ref 6–20)
CHLORIDE: 102 mmol/L (ref 101–111)
CO2: 28 mmol/L (ref 22–32)
Calcium: 10.6 mg/dL — ABNORMAL HIGH (ref 8.9–10.3)
Creatinine, Ser: 0.92 mg/dL (ref 0.61–1.24)
GFR calc Af Amer: >60 mL/min (ref >60)
GFR calc non Af Amer: >60 mL/min (ref >60)
GLUCOSE: 116 mg/dL — AB (ref 65–99)
POTASSIUM: 3.7 mmol/L (ref 3.5–5.1)
SODIUM: 141 mmol/L (ref 135–145)
Total Bilirubin: 0.5 mg/dL (ref 0.3–1.2)
Total Protein: 6.8 g/dL (ref 6.5–8.1)

## 2016-10-01 LAB — URINALYSIS, ROUTINE W REFLEX MICROSCOPIC
BILIRUBIN URINE: NEGATIVE
GLUCOSE, UA: NEGATIVE mg/dL
Hgb urine dipstick: NEGATIVE
KETONES UR: NEGATIVE mg/dL
Leukocytes, UA: NEGATIVE
Nitrite: NEGATIVE
PH: 6 (ref 5.0–8.0)
Protein, ur: NEGATIVE mg/dL
SPECIFIC GRAVITY, URINE: 1.01 (ref 1.005–1.030)

## 2016-10-01 LAB — LIPASE, BLOOD: LIPASE: 43 U/L (ref 11–51)

## 2016-10-01 MED ORDER — OXYCODONE-ACETAMINOPHEN 5-325 MG PO TABS: 1.0000 | ORAL_TABLET | Freq: Once | ORAL | Status: DC

## 2016-10-01 MED ORDER — ONDANSETRON 4 MG PO TBDP: 4.0000 mg | ORAL_TABLET | Freq: Three times a day (TID) | ORAL | 0 refills | Status: DC | PRN
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
## 2016-10-01 NOTE — ED Notes (Signed)
Pt walked out of room, pt unwilling to wait for discharge papers or medication.

## 2016-10-01 NOTE — ED Notes (Signed)
Pt seen [facility] for acute on chronic pancreatitis, per care everywhere pt has Lipase of 1384. Today Lipase 43. Pt c/o LUQ abd pain with n/v x 3 today. Pt states he felt he should have been admitted at [facility] for pain control. Pt states he was given to pain script yesterday.

## 2016-10-01 NOTE — ED Triage Notes (Signed)
Per PT, Pt is coming from home with complaints of LUQ pain that started yesterday. [facility] and had a Lipase of 1200. Pt has Hx of Chronic Pancreatitis. Pt was sent home, but reports the pain has increased. Denies Diarrhea, but reports Vomiting and diarrhea.

## 2016-12-20 DIAGNOSIS — M545 Low back pain: Secondary | ICD-10-CM | POA: Diagnosis not present

## 2016-12-20 DIAGNOSIS — E119 Type 2 diabetes mellitus without complications: Secondary | ICD-10-CM | POA: Diagnosis not present

## 2016-12-20 DIAGNOSIS — E784 Other hyperlipidemia: Secondary | ICD-10-CM | POA: Diagnosis not present

## 2016-12-20 DIAGNOSIS — E559 Vitamin D deficiency, unspecified: Secondary | ICD-10-CM | POA: Diagnosis not present

## 2017-03-11 DIAGNOSIS — K080 Exfoliation of teeth due to systemic causes: Secondary | ICD-10-CM | POA: Diagnosis not present

## 2017-03-21 DIAGNOSIS — F419 Anxiety disorder, unspecified: Secondary | ICD-10-CM | POA: Diagnosis not present

## 2017-03-21 DIAGNOSIS — E781 Pure hyperglyceridemia: Secondary | ICD-10-CM | POA: Diagnosis not present

## 2017-03-21 DIAGNOSIS — E559 Vitamin D deficiency, unspecified: Secondary | ICD-10-CM | POA: Diagnosis not present

## 2017-03-21 DIAGNOSIS — Z0000 Encounter for general adult medical examination without abnormal findings: Secondary | ICD-10-CM | POA: Diagnosis not present

## 2017-03-21 DIAGNOSIS — E119 Type 2 diabetes mellitus without complications: Secondary | ICD-10-CM | POA: Diagnosis not present

## 2017-03-21 DIAGNOSIS — D45 Polycythemia vera: Secondary | ICD-10-CM | POA: Diagnosis not present

## 2017-03-21 DIAGNOSIS — K859 Acute pancreatitis without necrosis or infection, unspecified: Secondary | ICD-10-CM | POA: Diagnosis not present

## 2017-06-28 DIAGNOSIS — E1165 Type 2 diabetes mellitus with hyperglycemia: Secondary | ICD-10-CM | POA: Diagnosis not present

## 2017-06-28 DIAGNOSIS — M545 Low back pain: Secondary | ICD-10-CM | POA: Diagnosis not present

## 2017-06-28 DIAGNOSIS — K859 Acute pancreatitis without necrosis or infection, unspecified: Secondary | ICD-10-CM | POA: Diagnosis not present

## 2017-06-28 DIAGNOSIS — E781 Pure hyperglyceridemia: Secondary | ICD-10-CM | POA: Diagnosis not present

## 2017-06-28 DIAGNOSIS — M5489 Other dorsalgia: Secondary | ICD-10-CM | POA: Diagnosis not present

## 2017-06-28 DIAGNOSIS — E784 Other hyperlipidemia: Secondary | ICD-10-CM | POA: Diagnosis not present

## 2017-06-28 DIAGNOSIS — K7689 Other specified diseases of liver: Secondary | ICD-10-CM | POA: Diagnosis not present

## 2017-06-28 DIAGNOSIS — E559 Vitamin D deficiency, unspecified: Secondary | ICD-10-CM | POA: Diagnosis not present

## 2017-08-03 DIAGNOSIS — Z89512 Acquired absence of left leg below knee: Secondary | ICD-10-CM | POA: Diagnosis not present

## 2017-11-10 DIAGNOSIS — R945 Abnormal results of liver function studies: Secondary | ICD-10-CM | POA: Diagnosis not present

## 2017-11-10 DIAGNOSIS — I10 Essential (primary) hypertension: Secondary | ICD-10-CM | POA: Diagnosis not present

## 2017-11-10 DIAGNOSIS — M545 Low back pain: Secondary | ICD-10-CM | POA: Diagnosis not present

## 2017-11-10 DIAGNOSIS — E785 Hyperlipidemia, unspecified: Secondary | ICD-10-CM | POA: Diagnosis not present

## 2018-01-09 ENCOUNTER — Emergency Department (HOSPITAL_COMMUNITY): Payer: Federal, State, Local not specified - PPO

## 2018-01-09 ENCOUNTER — Encounter (HOSPITAL_COMMUNITY): Payer: Self-pay | Admitting: *Deleted

## 2018-01-09 ENCOUNTER — Other Ambulatory Visit: Payer: Self-pay

## 2018-01-09 ENCOUNTER — Emergency Department (HOSPITAL_COMMUNITY)
Admission: EM | Admit: 2018-01-09 | Discharge: 2018-01-09 | Disposition: A | Discharge status: Home / Self Care | Payer: Federal, State, Local not specified - PPO | Attending: Emergency Medicine | Admitting: Emergency Medicine

## 2018-01-09 DIAGNOSIS — Z7984 Long term (current) use of oral hypoglycemic drugs: Secondary | ICD-10-CM | POA: Diagnosis not present

## 2018-01-09 DIAGNOSIS — F17210 Nicotine dependence, cigarettes, uncomplicated: Secondary | ICD-10-CM | POA: Diagnosis not present

## 2018-01-09 DIAGNOSIS — R1013 Epigastric pain: Secondary | ICD-10-CM | POA: Insufficient documentation

## 2018-01-09 DIAGNOSIS — E119 Type 2 diabetes mellitus without complications: Secondary | ICD-10-CM | POA: Diagnosis not present

## 2018-01-09 DIAGNOSIS — R110 Nausea: Secondary | ICD-10-CM | POA: Insufficient documentation

## 2018-01-09 DIAGNOSIS — R1012 Left upper quadrant pain: Secondary | ICD-10-CM | POA: Insufficient documentation

## 2018-01-09 DIAGNOSIS — I10 Essential (primary) hypertension: Secondary | ICD-10-CM | POA: Diagnosis not present

## 2018-01-09 DIAGNOSIS — Z79899 Other long term (current) drug therapy: Secondary | ICD-10-CM | POA: Insufficient documentation

## 2018-01-09 DIAGNOSIS — K760 Fatty (change of) liver, not elsewhere classified: Secondary | ICD-10-CM | POA: Diagnosis not present

## 2018-01-09 LAB — COMPREHENSIVE METABOLIC PANEL
ALBUMIN: 4.1 g/dL (ref 3.5–5.0)
ALT: 41 U/L (ref 17–63)
AST: 28 U/L (ref 15–41)
Alkaline Phosphatase: 43 U/L (ref 38–126)
Anion gap: 12 (ref 5–15)
BILIRUBIN TOTAL: 0.5 mg/dL (ref 0.3–1.2)
BUN: 12 mg/dL (ref 6–20)
CHLORIDE: 102 mmol/L (ref 101–111)
CO2: 25 mmol/L (ref 22–32)
CREATININE: 0.94 mg/dL (ref 0.61–1.24)
Calcium: 10.3 mg/dL (ref 8.9–10.3)
GFR calc Af Amer: >60 mL/min (ref >60)
GLUCOSE: 208 mg/dL — AB (ref 65–99)
Potassium: 3.7 mmol/L (ref 3.5–5.1)
Sodium: 139 mmol/L (ref 135–145)
Total Protein: 6.8 g/dL (ref 6.5–8.1)

## 2018-01-09 LAB — URINALYSIS, ROUTINE W REFLEX MICROSCOPIC
BILIRUBIN URINE: NEGATIVE
GLUCOSE, UA: 50 mg/dL — AB
HGB URINE DIPSTICK: NEGATIVE
KETONES UR: NEGATIVE mg/dL
LEUKOCYTES UA: NEGATIVE
NITRITE: NEGATIVE
PH: 7 (ref 5.0–8.0)
Protein, ur: 100 mg/dL — AB
Specific Gravity, Urine: 1.016 (ref 1.005–1.030)

## 2018-01-09 LAB — CBC WITH DIFFERENTIAL/PLATELET
BASOS ABS: 0 10*3/uL (ref 0.0–0.1)
Basophils Relative: 1 %
EOS PCT: 1 %
Eosinophils Absolute: 0.1 10*3/uL (ref 0.0–0.7)
HEMATOCRIT: 43.4 % (ref 39.0–52.0)
Hemoglobin: 15 g/dL (ref 13.0–17.0)
LYMPHS PCT: 22 %
Lymphs Abs: 1.7 10*3/uL (ref 0.7–4.0)
MCH: 31.1 pg (ref 26.0–34.0)
MCHC: 34.6 g/dL (ref 30.0–36.0)
MCV: 90 fL (ref 78.0–100.0)
MONO ABS: 0.4 10*3/uL (ref 0.1–1.0)
MONOS PCT: 5 %
Neutro Abs: 5.5 10*3/uL (ref 1.7–7.7)
Neutrophils Relative %: 71 %
PLATELETS: 219 10*3/uL (ref 150–400)
RBC: 4.82 MIL/uL (ref 4.22–5.81)
RDW: 13.6 % (ref 11.5–15.5)
WBC: 7.7 10*3/uL (ref 4.0–10.5)

## 2018-01-09 LAB — LIPASE, BLOOD: LIPASE: 47 U/L (ref 11–51)

## 2018-01-09 LAB — I-STAT CG4 LACTIC ACID, ED: Lactic Acid, Venous: 1.27 mmol/L (ref 0.5–1.9)

## 2018-01-09 MED ORDER — ONDANSETRON 4 MG PO TBDP
8.0000 mg | ORAL_TABLET | Freq: Once | ORAL | Status: AC
  Administered 2018-01-09: 8 mg via ORAL
  Filled 2018-01-09: qty 2

## 2018-01-09 MED ORDER — IOPAMIDOL (ISOVUE-300) INJECTION 61%
100.0000 mL | Freq: Once | INTRAVENOUS | Status: AC | PRN
  Administered 2018-01-09: 100 mL via INTRAVENOUS

## 2018-01-09 MED ORDER — SODIUM CHLORIDE 0.9 % IV BOLUS
1000.0000 mL | Freq: Once | INTRAVENOUS | Status: AC
  Administered 2018-01-09: 1000 mL via INTRAVENOUS

## 2018-01-09 MED ORDER — ONDANSETRON 4 MG PO TBDP: 4.0000 mg | ORAL_TABLET | Freq: Three times a day (TID) | ORAL | 0 refills | Status: DC | PRN

## 2018-01-09 MED ORDER — OXYCODONE-ACETAMINOPHEN 5-325 MG PO TABS
1.0000 | ORAL_TABLET | Freq: Once | ORAL | Status: AC
  Administered 2018-01-09: 1 via ORAL
  Filled 2018-01-09: qty 1

## 2018-01-09 MED ORDER — MORPHINE SULFATE (PF) 4 MG/ML IV SOLN
4.0000 mg | Freq: Once | INTRAVENOUS | Status: AC
  Administered 2018-01-09: 4 mg via INTRAVENOUS
  Filled 2018-01-09: qty 1
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
## 2018-01-09 NOTE — ED Notes (Signed)
This RN spoke with patient concerning plan of care.  Patient expressed he had not seen a provider yet.  Patient made aware that he was in a bed to be seen by an ED provider. A&Ox4, no distress at this time.

## 2018-01-09 NOTE — ED Notes (Signed)
ED Provider at bedside. 

## 2018-01-09 NOTE — ED Notes (Signed)
Patient Alert and oriented to baseline. Stable and ambulatory to baseline. Patient verbalized understanding of the discharge instructions.  Patient belongings were taken by the patient.   

## 2018-01-09 NOTE — ED Notes (Signed)
Patient eating and drinking without difficulty.

## 2018-01-09 NOTE — ED Triage Notes (Signed)
PT states abdominal pain and nausea for several days.  States it feels like his typical pancreatitis flare-up.

## 2018-01-09 NOTE — Discharge Instructions (Addendum)
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  It is recommended that you undergo yearly CT or MRI for assessment of this issue.  There is a small, 3 mm nodule in the middle lobe of the right lung. This should be followed up upon by your primary care provider.  It may or may not need repeat imaging in 12 months, depending on your risk factors.  Hand washing: Wash your hands throughout the day, but especially before and after touching the face, using the restroom, sneezing, coughing, or touching surfaces that have been coughed or sneezed upon. Hydration: Symptoms will be intensified and complicated by dehydration. Dehydration can also extend the duration of symptoms. Drink plenty of fluids and get plenty of rest. You should be drinking at least half a liter of water an hour to stay hydrated. Electrolyte drinks (ex. Gatorade, Powerade, Pedialyte) are also encouraged. You should be drinking enough fluids to make your urine light yellow, almost clear. If this is not the case, you are not drinking enough water. Please note that some of the treatments indicated below will not be effective if you are not adequately hydrated. Diet: Please concentrate on hydration, however, you may introduce food slowly.  Start with a clear liquid diet, progressed to a full liquid diet, and then bland solids as you are able. Pain or fever: Ibuprofen, Naproxen, or Tylenol for pain or fever.  Nausea/vomiting: Use the Zofran for nausea or vomiting.  Follow up: Follow up with a primary care provider, as needed, for any future management of this issue.

## 2018-02-21 ENCOUNTER — Ambulatory Visit: Payer: Federal, State, Local not specified - PPO | Admitting: Family Medicine

## 2018-02-21 ENCOUNTER — Encounter: Payer: Self-pay | Admitting: Family Medicine

## 2018-02-21 ENCOUNTER — Other Ambulatory Visit: Payer: Self-pay

## 2018-02-21 VITALS — BP 118/76 | HR 71 | Temp 97.6°F | Ht 73.0 in | Wt 230.0 lb

## 2018-02-21 DIAGNOSIS — E785 Hyperlipidemia, unspecified: Secondary | ICD-10-CM | POA: Diagnosis not present

## 2018-02-21 DIAGNOSIS — Z1211 Encounter for screening for malignant neoplasm of colon: Secondary | ICD-10-CM | POA: Insufficient documentation

## 2018-02-21 DIAGNOSIS — E1165 Type 2 diabetes mellitus with hyperglycemia: Secondary | ICD-10-CM | POA: Diagnosis not present

## 2018-02-21 DIAGNOSIS — I10 Essential (primary) hypertension: Secondary | ICD-10-CM | POA: Diagnosis not present

## 2018-02-21 DIAGNOSIS — Z23 Encounter for immunization: Secondary | ICD-10-CM

## 2018-02-21 DIAGNOSIS — Z716 Tobacco abuse counseling: Secondary | ICD-10-CM | POA: Insufficient documentation

## 2018-02-21 DIAGNOSIS — Z0000 Encounter for general adult medical examination without abnormal findings: Secondary | ICD-10-CM | POA: Diagnosis not present

## 2018-02-21 LAB — POCT GLYCOSYLATED HEMOGLOBIN (HGB A1C): HEMOGLOBIN A1C: 7.5

## 2018-02-21 MED ORDER — HYDROCHLOROTHIAZIDE 25 MG PO TABS: 25.0000 mg | ORAL_TABLET | Freq: Every day | ORAL | 5 refills | Status: DC

## 2018-02-21 MED ORDER — OMEGA-3-ACID ETHYL ESTERS 1 G PO CAPS: 4.0000 g | ORAL_CAPSULE | Freq: Every day | ORAL | 2 refills | Status: DC

## 2018-02-21 MED ORDER — LISINOPRIL 40 MG PO TABS: 40.0000 mg | ORAL_TABLET | Freq: Every day | ORAL | 5 refills | Status: DC

## 2018-02-21 MED ORDER — METFORMIN HCL 500 MG PO TABS: 1000.0000 mg | ORAL_TABLET | Freq: Two times a day (BID) | ORAL | 5 refills | Status: DC

## 2018-02-21 MED ORDER — LIRAGLUTIDE 18 MG/3ML ~~LOC~~ SOPN: 1.8000 mg | PEN_INJECTOR | Freq: Every morning | SUBCUTANEOUS | 5 refills | Status: DC

## 2018-02-21 NOTE — Patient Instructions (Addendum)
It was great to meet you today! Thank you for letting me participate in your care!  Today, we discussed your routine care and we are getting blood work today. I will call you with any abnormal results. I increased your Victoza from 1.1 to 1.8 daily. I have reordered all your medications. Please start exercising 30 minutes per day 5 time per week.  I will see you back in 3 months for a follow up for your DM and HTN.  Be well, Jules Schick, DO PGY-1, [facility]

## 2018-02-21 NOTE — Progress Notes (Signed)
Subjective: Chief Complaint  Patient presents with  . Diabetes    HPI: [name] [name] is a 47 y.o. presenting to clinic today to discuss the following:  Establish Care Was going to PCP in [place] but it is to far to drive. He is here today to establish care. 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  HTN BP well controlled today 118/76. He is currently taking lisinopril and HCTZ medications and keeping BP log.  DM He is currently taking Victoza and Metformin. His A1c has not been well controlled in the past. He is not currently exercising spending dedicated time for at least 30 minutes per day 5 times per week.  Review of Systems  Constitutional: Negative for fever, malaise/fatigue and weight loss.  HENT: Negative for congestion, sinus pain and sore throat.   Respiratory: Negative for cough, sputum production, shortness of breath and wheezing.   Cardiovascular: Negative for chest pain.  Gastrointestinal: Negative for abdominal pain, blood in stool, constipation, diarrhea, heartburn, nausea and vomiting.  Genitourinary: Negative for dysuria, frequency and urgency.  Musculoskeletal: Negative for back pain, joint pain and myalgias.  Skin: Negative for rash.  Neurological: Positive for headaches.  Psychiatric/Behavioral: Negative for depression and suicidal ideas.   Health Maintenance: HIV screen, Tetanus, HgbA1c     ROS noted in HPI.   Past Medical, Surgical, Social, and Family History Reviewed & Updated per EMR.   Pertinent Historical Findings include:   Social History   Tobacco Use  Smoking Status Current Every Day Smoker  . Packs/day: 1.00  . Years: 10.00  . Pack years: 10.00  . Types: Cigarettes  Smokeless Tobacco Never Used    Objective: Ht  (1.854 m)   Wt 230 lb (104.3 kg)   BMI 30.34 kg/m  Vitals and nursing notes  reviewed  Physical Exam  Constitutional: He is oriented to person, place, and time. He appears well-developed and well-nourished. No distress.  HENT:  Head: Normocephalic and atraumatic.  Right Ear: External ear normal.  Left Ear: External ear normal.  Eyes: Pupils are equal, round, and reactive to light. Conjunctivae and EOM are normal.  Neck: Normal range of motion. No tracheal deviation present. No thyromegaly present.  Cardiovascular: Normal rate, regular rhythm, normal heart sounds and intact distal pulses.  No murmur heard. Pulmonary/Chest: Effort normal and breath sounds normal. No respiratory distress. He has no wheezes. He has no rales.  Abdominal: Soft. Bowel sounds are normal. He exhibits no distension and no mass. There is no tenderness. There is no guarding.  Musculoskeletal: Normal range of motion. He exhibits no edema.  Left BKA  Lymphadenopathy:    He has no cervical adenopathy.  Neurological: He is alert and oriented to person, place, and time.  Skin: Skin is warm and dry. Capillary refill takes less than 2 seconds. No rash noted. No erythema.   Results for orders placed or performed in visit on 02/21/18 (from the past 72 hour(s))  HgB A1c     Status: Abnormal   Collection Time: 02/21/18  9:30 AM  Result Value Ref Range   Hemoglobin A1C 7.5   HIV antibody (with reflex)     Status: None   Collection Time: 02/21/18 10:17 AM  Result Value Ref Range   HIV Screen 4th Generation wRfx Non Reactive Non Reactive  Comprehensive metabolic panel  Status: Abnormal   Collection Time: 02/21/18 10:17 AM  Result Value Ref Range   Glucose 121 (H) 65 - 99 mg/dL   BUN 14 6 - 24 mg/dL   Creatinine, Ser 1.61 0.76 - 1.27 mg/dL   GFR calc non Af Amer 108 >59 mL/min/1.73   GFR calc Af Amer 125 >59 mL/min/1.73   BUN/Creatinine Ratio 18 9 - 20   Sodium 141 134 - 144 mmol/L   Potassium 4.4 3.5 - 5.2 mmol/L   Chloride 102 96 - 106 mmol/L   CO2 24 20 - 29 mmol/L   Calcium 10.2 8.7 -  10.2 mg/dL   Total Protein 7.1 6.0 - 8.5 g/dL   Albumin 4.7 3.5 - 5.5 g/dL   Globulin, Total 2.4 1.5 - 4.5 g/dL   Albumin/Globulin Ratio 2.0 1.2 - 2.2   Bilirubin Total 0.3 0.0 - 1.2 mg/dL   Alkaline Phosphatase 45 39 - 117 IU/L   AST 22 0 - 40 IU/L   ALT 40 0 - 44 IU/L  Lipid Panel     Status: Abnormal   Collection Time: 02/21/18 10:17 AM  Result Value Ref Range   Cholesterol, Total 153 100 - 199 mg/dL   Triglycerides 096 (H) 0 - 149 mg/dL   HDL 39 (L) >04 mg/dL   VLDL Cholesterol Cal 49 (H) 5 - 40 mg/dL   LDL Calculated 65 0 - 99 mg/dL   Chol/HDL Ratio 3.9 0.0 - 5.0 ratio    Comment:                                   T. Chol/HDL Ratio                                             Men  Women                               1/2 Avg.Risk  3.4    3.3                                   Avg.Risk  5.0    4.4                                2X Avg.Risk  9.6    7.1                                3X Avg.Risk 23.4   11.0     Assessment/Plan:  Essential hypertension Well controlled today. Cont Lisinopril  and HCTZ  Recommended exercise and weight loss would possibly allow for taking away one or both BP meds in time.  Obtain BMET today to check serum Cr and potassium  Diabetes mellitus type 2, uncontrolled (HCC) A1c not at goal today at 7.5. I have increased his Victoza from 1.1 to 1.8 today. Cont Metformin  BID. Tolerating medications well, encouraged healthy diet and avoidance of high sugary foods, snacks, and soft drinks.  Hyperlipidemia Checking lipid panel (non-fasting) as it has not been checked in two years. Currently taking  Omega-3 4 times daily. LDL and total cholesterol below levels for statin therapy   PATIENT EDUCATION PROVIDED: See AVS    Diagnosis and plan along with any newly prescribed medication(s) were discussed in detail with this patient today. The patient verbalized understanding and agreed with the plan. Patient advised if symptoms worsen return to  clinic or ER.   Health Maintainance:   Orders Placed This Encounter  Procedures  . HgB A1c    Meds ordered this encounter  Medications  . lisinopril (PRINIVIL,ZESTRIL) 40 MG tablet    Sig: Take 1 tablet (40 mg total) by mouth daily.    Dispense:  30 tablet    Refill:  5  . omega-3 acid ethyl esters (LOVAZA) 1 g capsule    Sig: Take 4 capsules (4 g total) by mouth daily.    Dispense:  120 capsule    Refill:  2  . hydrochlorothiazide (HYDRODIURIL) 25 MG tablet    Sig: Take 1 tablet (25 mg total) by mouth daily.    Dispense:  30 tablet    Refill:  5  . liraglutide (VICTOZA) 18 MG/3ML SOPN    Sig: Inject 0.3 mLs (1.8 mg total) into the skin every morning.    Dispense:  9 mL    Refill:  5  . metFORMIN (GLUCOPHAGE) 500 MG tablet    Sig: Take 2 tablets (1,000 mg total) by mouth 2 (two) times daily with a meal.    Dispense:  60 tablet    Refill:  5     Tim Karen Chafe, DO 02/21/2018, 9:16 AM PGY-1, [facility]

## 2018-02-22 ENCOUNTER — Encounter: Payer: Self-pay | Admitting: Family Medicine

## 2018-02-22 LAB — COMPREHENSIVE METABOLIC PANEL
A/G RATIO: 2 (ref 1.2–2.2)
ALBUMIN: 4.7 g/dL (ref 3.5–5.5)
ALT: 40 IU/L (ref 0–44)
AST: 22 IU/L (ref 0–40)
Alkaline Phosphatase: 45 IU/L (ref 39–117)
BILIRUBIN TOTAL: 0.3 mg/dL (ref 0.0–1.2)
BUN / CREAT RATIO: 18 (ref 9–20)
BUN: 14 mg/dL (ref 6–24)
CHLORIDE: 102 mmol/L (ref 96–106)
CO2: 24 mmol/L (ref 20–29)
Calcium: 10.2 mg/dL (ref 8.7–10.2)
Creatinine, Ser: 0.76 mg/dL (ref 0.76–1.27)
GFR, EST AFRICAN AMERICAN: 125 mL/min/{1.73_m2} (ref >59)
GFR, EST NON AFRICAN AMERICAN: 108 mL/min/{1.73_m2} (ref >59)
GLOBULIN, TOTAL: 2.4 g/dL (ref 1.5–4.5)
Glucose: 121 mg/dL — ABNORMAL HIGH (ref 65–99)
POTASSIUM: 4.4 mmol/L (ref 3.5–5.2)
SODIUM: 141 mmol/L (ref 134–144)
TOTAL PROTEIN: 7.1 g/dL (ref 6.0–8.5)

## 2018-02-22 LAB — LIPID PANEL
CHOL/HDL RATIO: 3.9 ratio (ref 0.0–5.0)
Cholesterol, Total: 153 mg/dL (ref 100–199)
HDL: 39 mg/dL — ABNORMAL LOW (ref >39)
LDL Calculated: 65 mg/dL (ref 0–99)
Triglycerides: 247 mg/dL — ABNORMAL HIGH (ref 0–149)
VLDL Cholesterol Cal: 49 mg/dL — ABNORMAL HIGH (ref 5–40)

## 2018-02-22 LAB — HIV ANTIBODY (ROUTINE TESTING W REFLEX): HIV SCREEN 4TH GENERATION: NONREACTIVE

## 2018-02-22 NOTE — Assessment & Plan Note (Addendum)
Well controlled today. Cont Lisinopril  and HCTZ  Recommended exercise and weight loss would possibly allow for taking away one or both BP meds in time.  Obtain BMET today to check serum Cr and potassium

## 2018-02-22 NOTE — Assessment & Plan Note (Signed)
A1c not at goal today at 7.5. I have increased his Victoza from 1.1 to 1.8 today. Cont Metformin  BID. Tolerating medications well, encouraged healthy diet and avoidance of high sugary foods, snacks, and soft drinks.

## 2018-02-22 NOTE — Assessment & Plan Note (Signed)
HIV screen obtained, negative. Results sent to patient. Received Tetanus shot today

## 2018-02-22 NOTE — Assessment & Plan Note (Addendum)
Checking lipid panel (non-fasting) as it has not been checked in two years. Currently taking Omega-3 4 times daily. LDL and total cholesterol below levels for statin therapy

## 2018-02-22 NOTE — Progress Notes (Signed)
Sending letter to inform patient of results

## 2018-05-01 ENCOUNTER — Encounter: Payer: Self-pay | Admitting: Family Medicine

## 2018-06-28 ENCOUNTER — Encounter: Payer: Self-pay | Admitting: Family Medicine

## 2018-06-28 ENCOUNTER — Other Ambulatory Visit: Payer: Self-pay

## 2018-06-28 ENCOUNTER — Ambulatory Visit: Payer: Federal, State, Local not specified - PPO | Admitting: Family Medicine

## 2018-06-28 VITALS — BP 118/72 | HR 93 | Temp 97.9°F | Ht 73.0 in | Wt 228.2 lb

## 2018-06-28 DIAGNOSIS — E11649 Type 2 diabetes mellitus with hypoglycemia without coma: Secondary | ICD-10-CM | POA: Diagnosis not present

## 2018-06-28 LAB — POCT GLYCOSYLATED HEMOGLOBIN (HGB A1C): HBA1C, POC (CONTROLLED DIABETIC RANGE): 7.7 % — AB (ref 0.0–7.0)

## 2018-06-28 NOTE — Assessment & Plan Note (Signed)
A1c still not to goal but patient wants to try exercise and dieting before adding a new medication at this time. If still elevated at next visit I will at SGLT-2 inhibitor, empagliflozin 10mg  daily.

## 2018-06-28 NOTE — Patient Instructions (Signed)
It was great to see you today! Thank you for letting me participate in your care!  Today, we discussed your diabetes and your elevated A1c. Please monitor your diet closely and avoid foods high in sugar and limit your carbohydrate intake. Also, I recommend incorporating some exercise   Be well, [name] Schickim Iwao Shamblin, DO PGY-2, [facility]

## 2018-06-28 NOTE — Progress Notes (Signed)
     Subjective: Chief Complaint  Patient presents with  . Follow-up    DM    HPI: [name] [name] is a 47 y.o. presenting to clinic today to discuss the following:  Diabetes Follow Up A1c today slightly elevated at 7.7, up from last check 3 months ago of 7.5. Patient admits to eating "things I know I shouldn't eat" and not having strict diet control that he usually maintains. He wants to attempt to get better control with diet and exercise and states he has been successful at lowering his A1c with these measures in the past.  Health Maintenance: flu vaccine in one week      ROS noted in HPI.   Past Medical, Surgical, Social, and Family History Reviewed & Updated per EMR.   Pertinent Historical Findings include:   Social History   Tobacco Use  Smoking Status Current Every Day Smoker  . Packs/day: 1.00  . Years: 10.00  . Pack years: 10.00  . Types: Cigarettes  Smokeless Tobacco Never Used    Objective: BP 118/72   Pulse 93   Temp 97.9 F (36.6 C) (Oral)   Ht 6\' 1"  (1.854 m)   Wt 228 lb 3.2 oz (103.5 kg)   SpO2 97%   BMI 30.11 kg/m  Vitals and nursing notes reviewed  Physical Exam Gen: Alert and Oriented x 3, NAD HEENT: Normocephalic, atraumatic CV: RRR, no murmurs, normal S1, S2 split Resp: CTAB, no wheezing, rales, or rhonchi, comfortable work of breathing MSK: Moves all four extremities Ext: no clubbing, cyanosis, or edema; diabetic foot exam performed today and noted in quality metrics Skin: warm, dry, intact, no rashes  Results for orders placed or performed in visit on 06/28/18 (from the past 72 hour(s))  HgB A1c     Status: Abnormal   Collection Time: 06/28/18  9:23 AM  Result Value Ref Range   Hemoglobin A1C     HbA1c POC (<> result, manual entry)     HbA1c, POC (prediabetic range)     HbA1c, POC (controlled diabetic range) 7.7 (A) 0.0 - 7.0 %    Assessment/Plan:  Diabetes mellitus type 2, uncontrolled (HCC) A1c still not to goal but patient  wants to try exercise and dieting before adding a new medication at this time. If still elevated at next visit I will at SGLT-2 inhibitor, empagliflozin 10mg  daily.   PATIENT EDUCATION PROVIDED: See AVS    Diagnosis and plan along with any newly prescribed medication(s) were discussed in detail with this patient today. The patient verbalized understanding and agreed with the plan. Patient advised if symptoms worsen return to clinic or ER.   Health Maintainance:   Orders Placed This Encounter  Procedures  . HgB A1c    No orders of the defined types were placed in this encounter.    Jules Schickim Aryona Sill, DO 06/28/2018, 9:34 AM PGY-2 [facility]

## 2018-07-26 ENCOUNTER — Other Ambulatory Visit: Payer: Self-pay | Admitting: Family Medicine

## 2018-08-14 ENCOUNTER — Other Ambulatory Visit: Payer: Self-pay | Admitting: *Deleted

## 2018-08-15 MED ORDER — LIRAGLUTIDE 18 MG/3ML ~~LOC~~ SOPN: 1.8000 mg | PEN_INJECTOR | Freq: Every morning | SUBCUTANEOUS | 5 refills | Status: DC

## 2018-09-15 ENCOUNTER — Other Ambulatory Visit: Payer: Self-pay | Admitting: Family Medicine

## 2018-10-26 ENCOUNTER — Other Ambulatory Visit: Payer: Self-pay | Admitting: Family Medicine

## 2018-12-04 ENCOUNTER — Other Ambulatory Visit: Payer: Self-pay | Admitting: Family Medicine

## 2018-12-18 ENCOUNTER — Ambulatory Visit: Payer: Federal, State, Local not specified - PPO | Admitting: Family Medicine

## 2018-12-19 ENCOUNTER — Telehealth: Payer: Self-pay | Admitting: *Deleted

## 2018-12-19 NOTE — Telephone Encounter (Signed)
Pts wife calls (she is a Engineer, civil (consulting) @ the [employer]).  She thinks that he may have the flu and wants to know if we can call something in.   Pt does have a fever (102.7) that decreases with tylenol to 99.3.  He is nauseous and has a productive cough.  He has not traveled or had any sick contacts that they are aware of. He did not have his flu shot this year.  Advised that pt would need an appt to receive tamiflu but would need to come in tomorrow since symptoms started yesterday.  Wife is aware but is pretty sure she will not be able to convince him to come in.  We talked about supportive measures and red flag to give [place] a call back.  Wife appreciative. Moncerrat Burnstein, Maryjo Rochester, CMA

## 2018-12-20 ENCOUNTER — Other Ambulatory Visit: Payer: Self-pay

## 2018-12-20 ENCOUNTER — Ambulatory Visit: Payer: Federal, State, Local not specified - PPO | Admitting: Family Medicine

## 2018-12-20 VITALS — BP 125/75 | HR 105 | Temp 97.6°F | Wt 225.0 lb

## 2018-12-20 DIAGNOSIS — E11649 Type 2 diabetes mellitus with hypoglycemia without coma: Secondary | ICD-10-CM | POA: Diagnosis not present

## 2018-12-20 DIAGNOSIS — J111 Influenza due to unidentified influenza virus with other respiratory manifestations: Secondary | ICD-10-CM

## 2018-12-20 DIAGNOSIS — J069 Acute upper respiratory infection, unspecified: Secondary | ICD-10-CM | POA: Diagnosis not present

## 2018-12-20 LAB — POCT GLYCOSYLATED HEMOGLOBIN (HGB A1C): HBA1C, POC (CONTROLLED DIABETIC RANGE): 8.4 % — AB (ref 0.0–7.0)

## 2018-12-20 LAB — POCT INFLUENZA A/B
Influenza A, POC: POSITIVE — AB
Influenza B, POC: NEGATIVE

## 2018-12-20 MED ORDER — AMOXICILLIN 500 MG PO CAPS: 500.0000 mg | ORAL_CAPSULE | Freq: Three times a day (TID) | ORAL | 0 refills | Status: DC

## 2018-12-20 NOTE — Progress Notes (Signed)
Subjective:  [name] [name] is a 48 y.o. male who presents to the [facility] today with a chief complaint of flulike symptoms.   HPI: Acute upper respiratory infection Patient with suspected bacterial respiratory infection on top of confirmed swab positive flu.  I have confirmed fever at home was 101.1, has mildly productive cough for the last 2 days only.  Has been able to keep down his fluid and thinks that he is reasonably well hydrated.  Has had no dizziness and only minor muscle aches.  Thinks he is otherwise breathing well beyond this cough no suspected travel or exposure to create increased risk for coronavirus so risk stratifying indicates he should not be tested for this at this time.   symptoms first started approximately 6 days ago but got significantly worse in the last day or 2 which is a timing and description suspicious for superimposed bacterial infection.   Diabetes mellitus type 2, uncontrolled (HCC) As patient was overdue for A1c we decided to draw today, he was 8.4 up from 7.7 her prior draws.  We discussed how this was worse and needed to be controlled for his long-term health.  But given that he was currently sick and potentially his appetite could be altered for the next week or so as he recovers he has been instructed to reschedule with his PCP to work on tighter control once he is feeling better  Objective:  Physical Exam: BP 125/75   Pulse (!) 105   Temp 97.6 F (36.4 C) (Oral)   Wt 225 lb (102.1 kg)   SpO2 95%   BMI 29.69 kg/m   Gen: NAD, resting comfortably, does not appear dehydrated CV: RRR with no murmurs appreciated.   Pulm: NWOB, CTAB with no crackles, wheezes, or rhonchi.  Occasional minor cough with deep inspiration, no production on this exam MSK: no edema, cyanosis, or clubbing noted Skin: warm, dry Neuro: grossly normal, moves all extremities Psych: Normal affect and thought content  Results for orders placed or performed in visit on 12/20/18 (from  the past 72 hour(s))  Influenza A/B     Status: Abnormal   Collection Time: 12/20/18  1:45 PM  Result Value Ref Range   Influenza A, POC Positive (A) Negative   Influenza B, POC Negative Negative  HgB A1c     Status: Abnormal   Collection Time: 12/20/18  1:52 PM  Result Value Ref Range   Hemoglobin A1C     HbA1c POC (<> result, manual entry)     HbA1c, POC (prediabetic range)     HbA1c, POC (controlled diabetic range) 8.4 (A) 0.0 - 7.0 %     Assessment/Plan:  Acute upper respiratory infection Patient with suspected bacterial respiratory infection on top of confirmed swab positive flu.  Symptoms first started approximately 6 days ago but got significantly worse in the last day or 2 which is a timing and description suspicious for superimposed bacterial infection.  Discussed lack of utility for Tamiflu given his story and using shared decision making decided to offer a course of amoxicillin with return precautions.  Diabetes mellitus type 2, uncontrolled (HCC) As patient was overdue for A1c we decided to draw today, he was 8.4 up from 7.7 her prior draws.  We discussed how this was worse and needed to be controlled for his long-term health.  But given that he was currently sick and potentially his appetite could be altered for the next week or so as he recovers he has been instructed  to reschedule with his PCP to work on tighter control once he is feeling better  Flu-like symptoms Flu swab positive not given Tamiflu because he has been sick for 6 days.   Marthenia Rolling, DO FAMILY MEDICINE RESIDENT - PGY2 12/21/2018 1:30 PM

## 2018-12-20 NOTE — Patient Instructions (Signed)
It was a pleasure to see you today! Thank you for choosing [facility] for your primary care. [name] [name] was seen for flulike symptoms.  Today we checked your A1c and it was an 8.4.  Please come back and talk to your primary care doctor about this so that you can work on a plan to help control a little bit better this is worse than your prior reading of 7.7.  Given that you are acutely ill [name] not going to make drastic changes today.  We did a swab and found that you were flu positive given the story line of how sick you have been for the last almost week and the fact that he got significantly worse in the last 2 days I think it is possible you have a overlying bacterial infection.  [name] to give you an antibiotic for that this will be amoxicillin for 10 days.  The amoxicillin does not treat the flu but this could treat your likely bacterial infection.  If you find you are not able to main hydration or if you are respiratory status gets much worse you need to come back to see [place].  You should start seeing some improvement over the next 4 to 5 days.     Please bring all your medications to every doctors visit   Sign up for My Chart to have easy access to your labs results, and communication with your Primary care physician.     Please check-out at the front desk before leaving the clinic.     Best,  Dr. Marthenia Rolling FAMILY MEDICINE RESIDENT - PGY2 12/20/2018 2:05 PM

## 2018-12-20 NOTE — Telephone Encounter (Signed)
Appt made for this afternoon with Bland @ 1:50. Fleeger, Maryjo Rochester, CMA

## 2018-12-21 DIAGNOSIS — R6889 Other general symptoms and signs: Secondary | ICD-10-CM | POA: Insufficient documentation

## 2018-12-21 DIAGNOSIS — J069 Acute upper respiratory infection, unspecified: Secondary | ICD-10-CM | POA: Insufficient documentation

## 2018-12-21 NOTE — Assessment & Plan Note (Signed)
Flu swab positive not given Tamiflu because he has been sick for 6 days.

## 2018-12-21 NOTE — Assessment & Plan Note (Signed)
Patient with suspected bacterial respiratory infection on top of confirmed swab positive flu.  Symptoms first started approximately 6 days ago but got significantly worse in the last day or 2 which is a timing and description suspicious for superimposed bacterial infection.  Discussed lack of utility for Tamiflu given his story and using shared decision making decided to offer a course of amoxicillin with return precautions.

## 2018-12-21 NOTE — Assessment & Plan Note (Signed)
As patient was overdue for A1c we decided to draw today, he was 8.4 up from 7.7 her prior draws.  We discussed how this was worse and needed to be controlled for his long-term health.  But given that he was currently sick and potentially his appetite could be altered for the next week or so as he recovers he has been instructed to reschedule with his PCP to work on tighter control once he is feeling better

## 2018-12-29 ENCOUNTER — Telehealth: Payer: Self-pay | Admitting: Family Medicine

## 2018-12-29 ENCOUNTER — Other Ambulatory Visit: Payer: Self-pay | Admitting: Family Medicine

## 2018-12-29 MED ORDER — LISINOPRIL 40 MG PO TABS: 40.0000 mg | ORAL_TABLET | Freq: Every day | ORAL | 5 refills | Status: DC

## 2018-12-29 NOTE — Telephone Encounter (Signed)
Pt came in office to request refill of: lisinopril   Name of Medication(s):  lisinopril Last date of OV:  12/20/2018 Pharmacy:  [facility]  Will route refill request to Clinic RN.  Discussed with patient policy to call pharmacy for future refills.  Also, discussed refills may take up to 48 hours to approve or deny.  Herma Mering Magtoto

## 2018-12-29 NOTE — Progress Notes (Signed)
Patient requested refill

## 2018-12-30 IMAGING — CT CT ABD-PELV W/ CM
1 of 6 series · 3 of 46 positions shown, 4 images · IV contrast (iopamidol)
Comparison: CT abdomen pelvis dated December 07, 2015.

CLINICAL DATA: Epigastric and left upper quadrant abdominal pain.
History of chronic pancreatitis.

EXAM:
CT ABDOMEN AND PELVIS WITH CONTRAST
TECHNIQUE: Multidetector CT imaging of the abdomen and pelvis was performed
using the standard protocol following bolus administration of
intravenous contrast.
CONTRAST:  100mL PFTGAX-4VV IOPAMIDOL (PFTGAX-4VV) INJECTION 61%

[Series 1012: virtual unenhanced vnc mpr tra 2mm range (auto) · axial · 0.38mm/px · z∈[+649,+1019]mm · 3 of 287 slices shown, 4 images]
[im 51/287  soft-tissue]
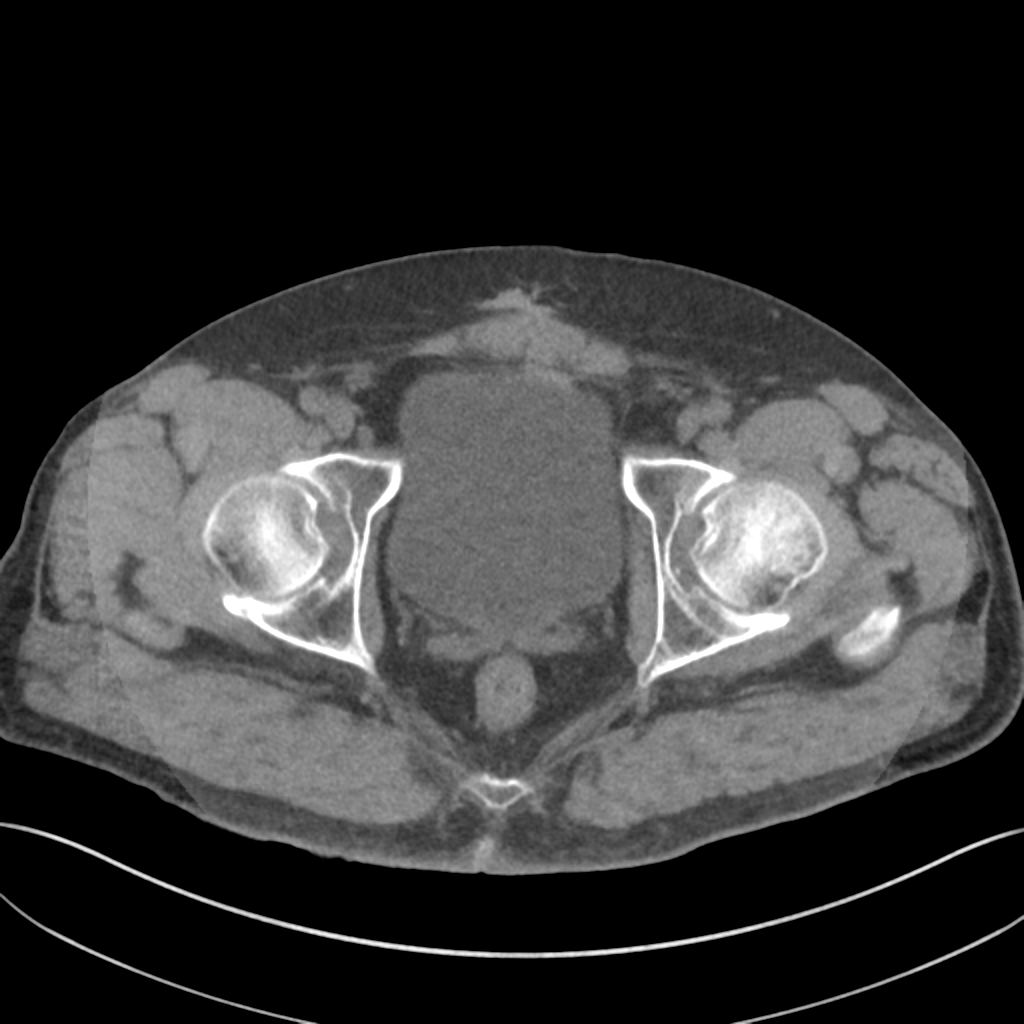
[im 51/287  bone]
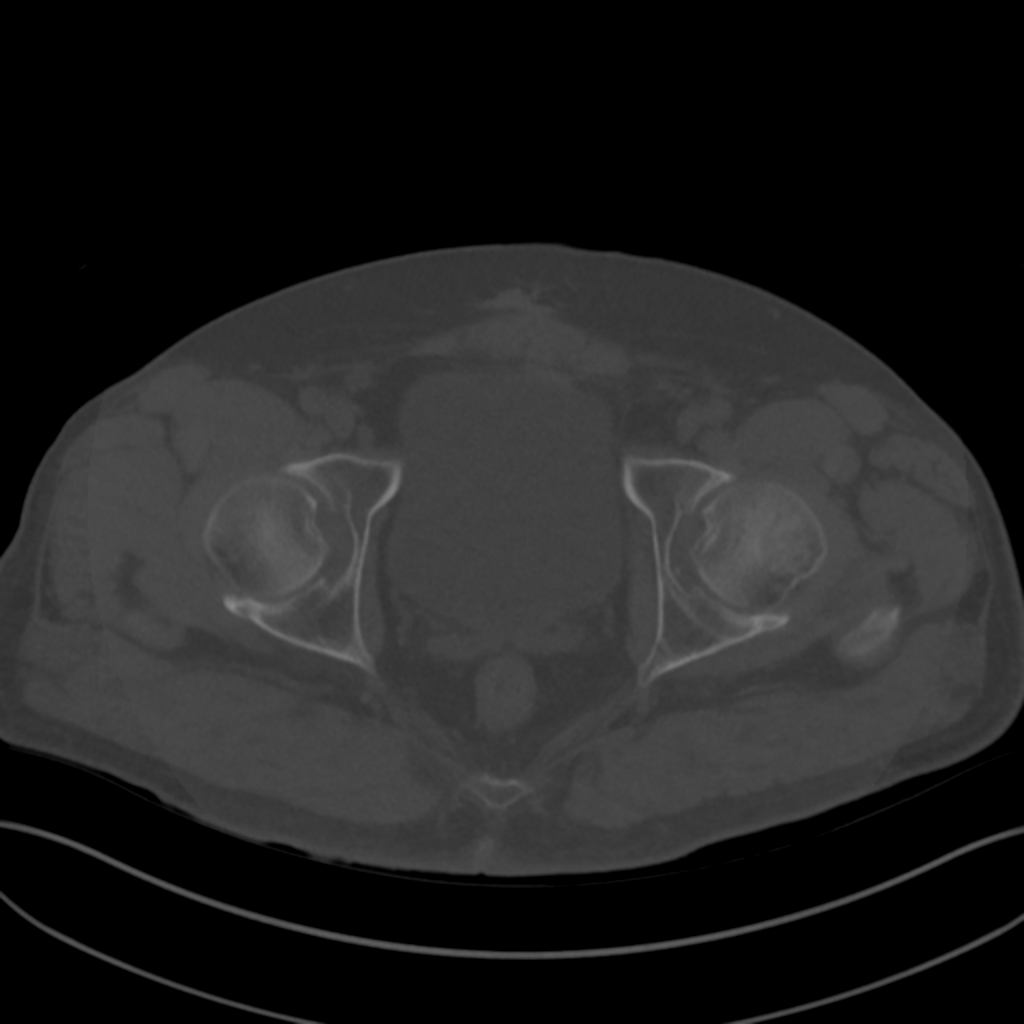
[im 152/287  soft-tissue]
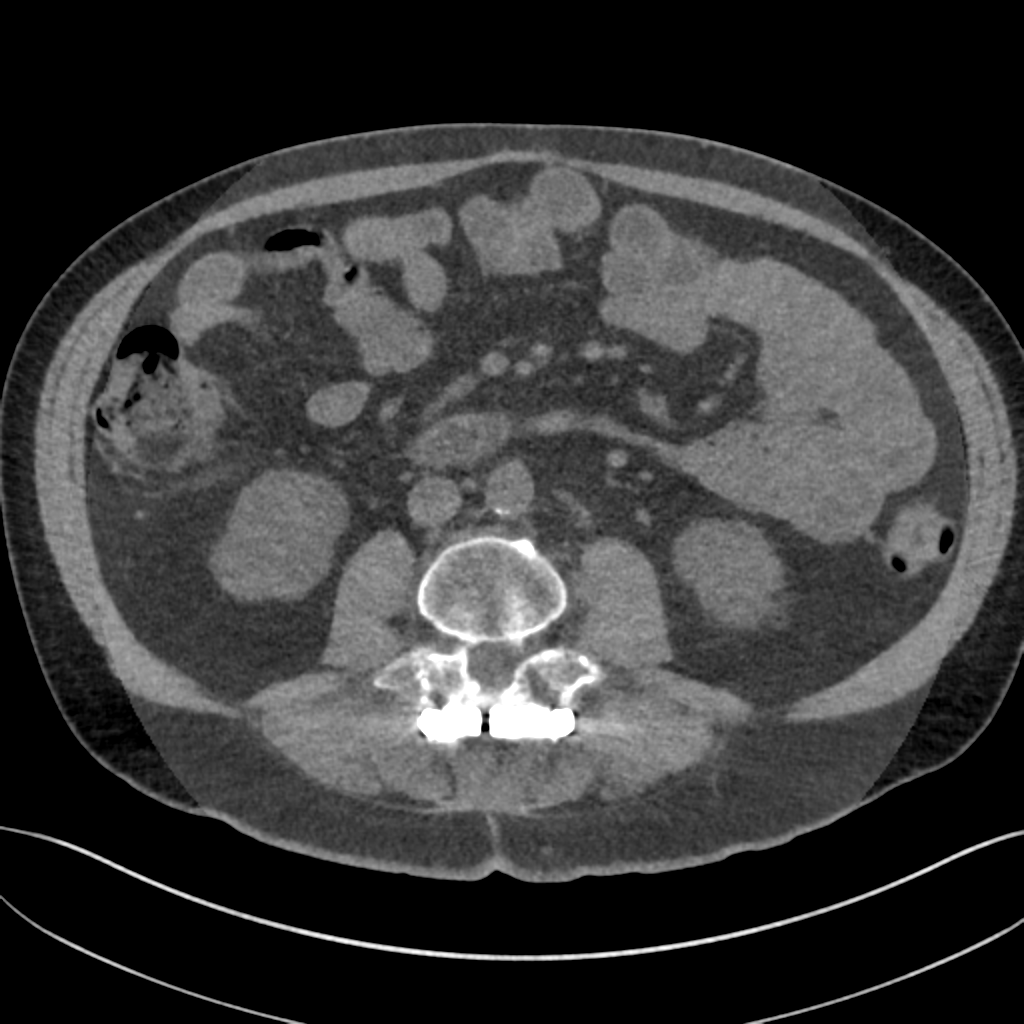
[im 236/287  soft-tissue]
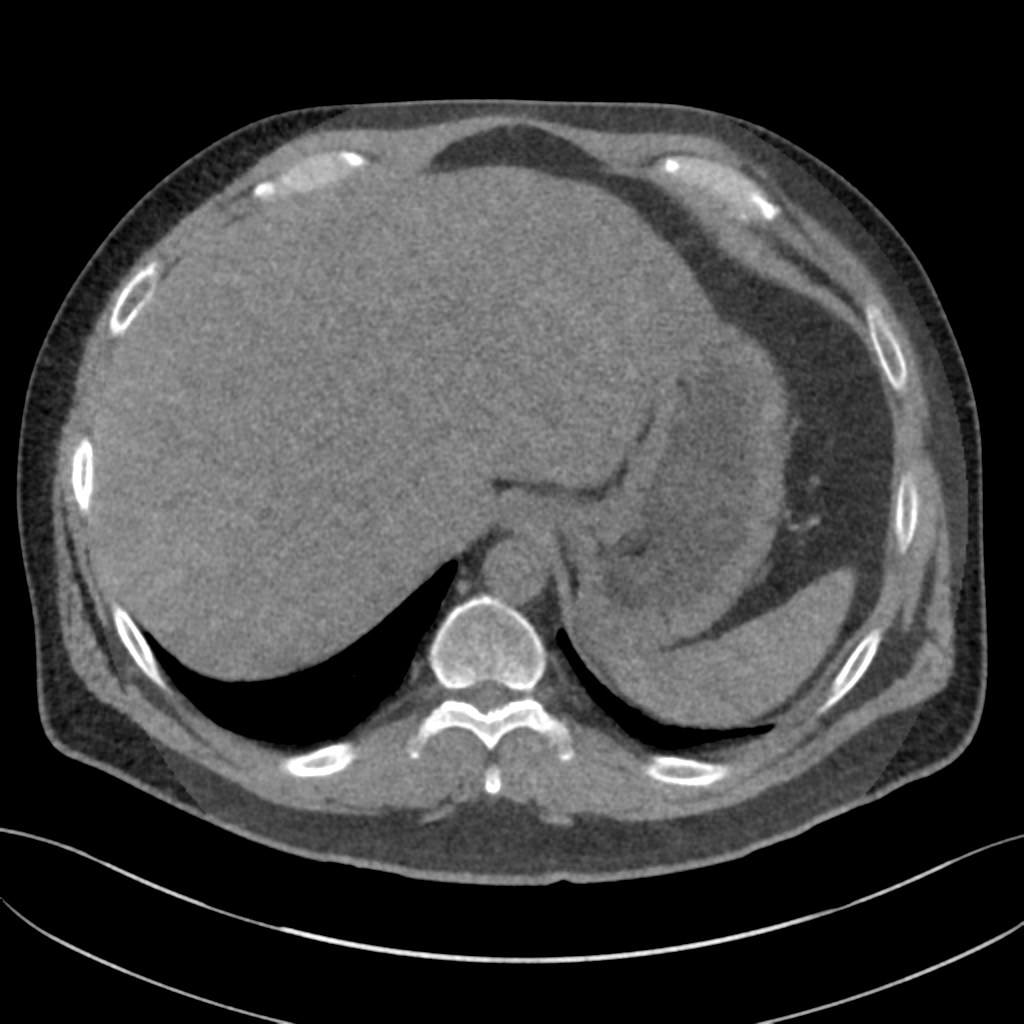

[3 of 46 positions shown; findings below may reference images not displayed]

FINDINGS: Lower chest: No acute abnormality. New 3 mm pulmonary nodule in the
right middle lobe (series 6, image 6). Unchanged 4 mm pulmonary
nodule in the peripheral left lower lobe (series 6, image 25),
stable since November 2015, benign.

Hepatobiliary: Hepatic steatosis. No focal liver abnormality. The
gallbladder is unremarkable. No biliary dilatation.

Pancreas: Unchanged 1.4 cm low-density cystic lesion in the tail of
the pancreas. No ductal dilatation or surrounding inflammatory
changes.

Spleen: Normal in size without focal abnormality.

Adrenals/Urinary Tract: The adrenal glands are unremarkable.
Subcentimeter low-density lesion in the right kidney remains too
small to characterize, but is unchanged. No renal or ureteral
calculi. No hydronephrosis. The bladder is unremarkable.

Stomach/Bowel: Stomach is within normal limits. Appendix appears
normal. No evidence of bowel wall thickening, distention, or
inflammatory changes.

Vascular/Lymphatic: Mild aortic atherosclerosis. Prominent left
retroperitoneal lymph nodes are unchanged, and favored reactive.

Reproductive: Coarse prostatic calcifications.

Other: No abdominal wall hernia or abnormality. No abdominopelvic
ascites. No pneumoperitoneum.

Musculoskeletal: No acute or significant osseous findings. Prior
L3-S1 posterior fusion. Moderate to severe degenerative disc disease
at L1-L2 and L2-L3, unchanged. Unchanged spinal cord stimulator with
the leads terminating at the T7-T8 level.
IMPRESSION: 1.  No acute intra-abdominal process.
2. Stable 1.4 cm cystic lesion in the pancreatic tail. This is
unchanged since 0340, but slightly increased in size since 9757,
when it measured 1.2 cm. Continued yearly follow-up pancreatic
protocol CT or MRI is recommended. This recommendation follows ACR
consensus guidelines: Management of Incidental Pancreatic Cysts: A
White Paper of the ACR Incidental Findings Committee. [HOSPITAL] 0340;[DATE].
3. Hepatic steatosis.
4. New 3 mm pulmonary nodule in the right middle lobe. No follow-up
needed if patient is low-risk. Non-contrast chest CT can be
considered in 12 months if patient is high-risk. This recommendation
follows the consensus statement: Guidelines for Management of
Incidental Pulmonary Nodules Detected on CT Images: From the

## 2019-01-02 ENCOUNTER — Telehealth: Payer: Self-pay

## 2019-01-02 NOTE — Telephone Encounter (Signed)
Pts wife called nurse line stating the patient was seen a few weeks ago in clinic, and had a positive flu test. Pts wife stated he is feeling better, however he has been experiencing some "chest tightness." Pts wife is a Engineer, civil (consulting) and she does not believe its cardiac. Pts wife is requesting a possible inhaler to be sent to the pharmacy, to help. This would be a new medication for the patient. Pts wife denies fever or SOB. Will forward to PCP to advise.   Call wife back at [phone] ext: [phone]

## 2019-01-03 NOTE — Telephone Encounter (Signed)
Do you want him to be seen today in [facility], or triaged? Or are you going to call him and a do a telephone visit?

## 2019-01-05 NOTE — Telephone Encounter (Signed)
Spoke with patient and he no longer feels he needs an inhaler. Pt stated he feels much better than he did a few days ago. Pt will call for a telephone visit if his sxs worsen.

## 2019-01-09 ENCOUNTER — Ambulatory Visit: Payer: Federal, State, Local not specified - PPO | Admitting: Family Medicine

## 2019-01-29 ENCOUNTER — Other Ambulatory Visit: Payer: Self-pay | Admitting: Family Medicine

## 2019-02-20 ENCOUNTER — Other Ambulatory Visit: Payer: Self-pay | Admitting: Family Medicine

## 2019-03-10 ENCOUNTER — Other Ambulatory Visit: Payer: Self-pay

## 2019-03-10 ENCOUNTER — Encounter (HOSPITAL_COMMUNITY): Payer: Self-pay | Admitting: Emergency Medicine

## 2019-03-10 ENCOUNTER — Emergency Department (HOSPITAL_COMMUNITY)
Admission: EM | Admit: 2019-03-10 | Discharge: 2019-03-10 | Disposition: A | Discharge status: Home / Self Care | Payer: Federal, State, Local not specified - PPO | Attending: Emergency Medicine | Admitting: Emergency Medicine

## 2019-03-10 DIAGNOSIS — E119 Type 2 diabetes mellitus without complications: Secondary | ICD-10-CM | POA: Diagnosis not present

## 2019-03-10 DIAGNOSIS — Z7984 Long term (current) use of oral hypoglycemic drugs: Secondary | ICD-10-CM | POA: Insufficient documentation

## 2019-03-10 DIAGNOSIS — L0291 Cutaneous abscess, unspecified: Secondary | ICD-10-CM

## 2019-03-10 DIAGNOSIS — I10 Essential (primary) hypertension: Secondary | ICD-10-CM | POA: Insufficient documentation

## 2019-03-10 DIAGNOSIS — F17210 Nicotine dependence, cigarettes, uncomplicated: Secondary | ICD-10-CM | POA: Diagnosis not present

## 2019-03-10 DIAGNOSIS — L02416 Cutaneous abscess of left lower limb: Secondary | ICD-10-CM | POA: Insufficient documentation

## 2019-03-10 DIAGNOSIS — Z79899 Other long term (current) drug therapy: Secondary | ICD-10-CM | POA: Diagnosis not present

## 2019-03-10 MED ORDER — OXYCODONE-ACETAMINOPHEN 5-325 MG PO TABS: 1.0000 | ORAL_TABLET | Freq: Four times a day (QID) | ORAL | 0 refills | Status: DC | PRN

## 2019-03-10 MED ORDER — HYDROCODONE-ACETAMINOPHEN 5-325 MG PO TABS
1.0000 | ORAL_TABLET | Freq: Once | ORAL | Status: DC
  Filled 2019-03-10: qty 1

## 2019-03-10 MED ORDER — HYDROCODONE-ACETAMINOPHEN 5-325 MG PO TABS
1.0000 | ORAL_TABLET | Freq: Once | ORAL | Status: AC
  Administered 2019-03-10: 10:00:00 1 via ORAL

## 2019-03-10 MED ORDER — CLINDAMYCIN HCL 150 MG PO CAPS: 450.0000 mg | ORAL_CAPSULE | Freq: Three times a day (TID) | ORAL | 0 refills | Status: DC

## 2019-03-10 MED ORDER — OXYCODONE-ACETAMINOPHEN 5-325 MG PO TABS
1.0000 | ORAL_TABLET | Freq: Once | ORAL | Status: AC
  Administered 2019-03-10: 1 via ORAL
  Filled 2019-03-10: qty 1

## 2019-03-10 MED ORDER — LIDOCAINE HCL 2 % IJ SOLN
20.0000 mL | Freq: Once | INTRAMUSCULAR | Status: AC
  Administered 2019-03-10: 11:00:00 400 mg via INTRADERMAL
  Filled 2019-03-10: qty 20

## 2019-03-10 NOTE — ED Triage Notes (Addendum)
Pt here for eval of 2 things. Pt has hernia to left groin that started causing pain yesterday. Pt also states he has a wound to his left BKA where his skin rubs against his prosthetic. Nodule noted to left upper leg. No active drainage. Pt requesting pain medication and also wondering if he can have 1 year follow up CT of his lungs stating his last CT showed a lung nodule that he is supposed to have rechecked this year.

## 2019-03-10 NOTE — Discharge Instructions (Addendum)
Return here for any worsening in your condition.  Follow-up with your doctor soon as possible.  Slowly remove the packing over the next 7 days.  If there is any changes or worsening to the area you need to come back and allow [place] to reevaluate the area.  If you develop fevers nausea vomiting or other worsening symptoms you will also need to be rechecked.

## 2019-03-10 NOTE — ED Notes (Signed)
Pt refused work note

## 2019-03-14 ENCOUNTER — Encounter (HOSPITAL_COMMUNITY): Payer: Self-pay | Admitting: Emergency Medicine

## 2019-03-14 ENCOUNTER — Other Ambulatory Visit: Payer: Self-pay

## 2019-03-14 ENCOUNTER — Emergency Department (HOSPITAL_COMMUNITY)
Admission: EM | Admit: 2019-03-14 | Discharge: 2019-03-14 | Disposition: A | Discharge status: Home / Self Care | Payer: Federal, State, Local not specified - PPO | Attending: Emergency Medicine | Admitting: Emergency Medicine

## 2019-03-14 DIAGNOSIS — E119 Type 2 diabetes mellitus without complications: Secondary | ICD-10-CM | POA: Diagnosis not present

## 2019-03-14 DIAGNOSIS — F419 Anxiety disorder, unspecified: Secondary | ICD-10-CM | POA: Insufficient documentation

## 2019-03-14 DIAGNOSIS — Z5189 Encounter for other specified aftercare: Secondary | ICD-10-CM

## 2019-03-14 DIAGNOSIS — L02416 Cutaneous abscess of left lower limb: Secondary | ICD-10-CM | POA: Diagnosis not present

## 2019-03-14 DIAGNOSIS — Z4801 Encounter for change or removal of surgical wound dressing: Secondary | ICD-10-CM | POA: Insufficient documentation

## 2019-03-14 LAB — CBG MONITORING, ED: Glucose-Capillary: 237 mg/dL — ABNORMAL HIGH (ref 70–99)

## 2019-03-14 MED ORDER — BACITRACIN-NEOMYCIN-POLYMYXIN OINTMENT TUBE
1.0000 "application " | TOPICAL_OINTMENT | Freq: Once | CUTANEOUS | Status: DC
  Filled 2019-03-14: qty 14

## 2019-03-14 NOTE — ED Notes (Addendum)
CBG obtained: 237mg /dL

## 2019-03-14 NOTE — Discharge Instructions (Addendum)
Thank you for allowing me to care for you today. Please return to the emergency department if you have new or worsening symptoms. Take your medications as instructed.  ° °

## 2019-03-14 NOTE — ED Triage Notes (Signed)
Pt here for packaging removal to L leg.  States he was told to return or follow-up with PCP.

## 2019-03-14 NOTE — ED Notes (Signed)
Pt verbalized understanding of discharge paperwork and follow-up care.  °
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
## 2019-03-16 ENCOUNTER — Ambulatory Visit: Payer: Federal, State, Local not specified - PPO | Admitting: Family Medicine

## 2019-03-16 ENCOUNTER — Other Ambulatory Visit: Payer: Self-pay

## 2019-03-16 VITALS — BP 118/80 | HR 88

## 2019-03-16 DIAGNOSIS — E11649 Type 2 diabetes mellitus with hypoglycemia without coma: Secondary | ICD-10-CM

## 2019-03-16 LAB — POCT GLYCOSYLATED HEMOGLOBIN (HGB A1C): HbA1c, POC (controlled diabetic range): 10.3 % — AB (ref 0.0–7.0)

## 2019-03-16 NOTE — Progress Notes (Signed)
     Subjective: Chief Complaint  Patient presents with  . Hospitalization Follow-up     HPI: [name] [name] is a 48 y.o. presenting to clinic today to discuss the following:  Leg Pressure Wound Patient has a pressure ulcer on the lateral side of his left leg caused by friction from his prothesis. Patient did go to the ED and received care, had the would cleaned and packed with gauze, then returned and had the gauze removed. He comes in today for follow up. He states it is tender but appears to be getting better. He states he has had no fever or chills, has been eating well with no nausea or vomiting. He has been to see his prothesis provider already and they will be getting him a new fit to decrease pressure on that area.  DM Patient endorses poor diet and lack of activity during COVID. He states "I know my A1c will be high". He wants to have some time to try and get it back down before taking more medication because he states "I've done it before and I know I can do better". He also endorses not wanting to go up or start a new medication for his diabetes.  ROS noted in HPI.   Past Medical, Surgical, Social, and Family History Reviewed & Updated per EMR.   Pertinent Historical Findings include:   Social History   Tobacco Use  Smoking Status Current Every Day Smoker  . Packs/day: 1.00  . Years: 10.00  . Pack years: 10.00  . Types: Cigarettes  Smokeless Tobacco Never Used    Objective: BP 118/80   Pulse 88   SpO2 97%  Vitals and nursing notes reviewed  Physical Exam Gen: Alert and Oriented x 3, NAD CV: RRR, no murmurs, normal S1, S2 split Resp: CTAB, no wheezing, rales, or rhonchi, comfortable work of breathing MSK: left leg lateral side has approximately 3x2cm ulcer with scab formation appears to be well healing. Mild surrounding erythema that is TTP but no fluctuance or pus noted Ext: no clubbing, cyanosis, or edema Skin: warm, dry, intact, no rashes   Results  for orders placed or performed during the hospital encounter of 03/14/19 (from the past 72 hour(s))  CBG monitoring, ED     Status: Abnormal   Collection Time: 03/14/19  7:53 AM  Result Value Ref Range   Glucose-Capillary 237 (H) 70 - 99 mg/dL    Assessment/Plan:  Diabetes mellitus type 2, uncontrolled (HCC) Uncontrolled and above goal with trend worsening. A1c was above 10. Patient noted lack of activity and poor diet due to COVID as main reasons. Recently reports he had been sick and thinks infection could have contributed. - Cont Metformin 2000mg   - Cont Victoza 18mg  - At follow up I will suggest we add a third medication, SGLT-2 or GLP-1  Leg Pressure Ulcer - Complete Prosthetic Care order and give to patient and communicate with his office - F/u in one week   PATIENT EDUCATION PROVIDED: See AVS    Diagnosis and plan along with any newly prescribed medication(s) were discussed in detail with this patient today. The patient verbalized understanding and agreed with the plan. Patient advised if symptoms worsen return to clinic or ER.     Orders Placed This Encounter  Procedures  . HgB A1c    No orders of the defined types were placed in this encounter.    Jules Schick, DO 03/16/2019, 10:56 AM PGY-2 [facility]
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
## 2019-03-20 ENCOUNTER — Encounter: Payer: Self-pay | Admitting: Family Medicine

## 2019-03-20 NOTE — Assessment & Plan Note (Signed)
Uncontrolled and above goal with trend worsening. A1c was above 10. Patient noted lack of activity and poor diet due to COVID as main reasons. Recently reports he had been sick and thinks infection could have contributed. - Cont Metformin 2000mg   - Cont Victoza 18mg  - At follow up I will suggest we add a third medication, SGLT-2 or GLP-1

## 2019-03-23 ENCOUNTER — Ambulatory Visit: Payer: Federal, State, Local not specified - PPO | Admitting: Family Medicine

## 2019-03-23 ENCOUNTER — Other Ambulatory Visit: Payer: Self-pay

## 2019-03-23 VITALS — BP 110/70 | HR 97 | Wt 228.0 lb

## 2019-03-23 DIAGNOSIS — E11649 Type 2 diabetes mellitus with hypoglycemia without coma: Secondary | ICD-10-CM | POA: Diagnosis not present

## 2019-03-23 NOTE — Assessment & Plan Note (Signed)
Poorly controlled. Due to shared decision making with patient he wants to trial a 3 month period of diet and exercise before starting a new medication. 1 3 month F/u, repeat A1c - If A1c is >10 at next visit start Jardiance

## 2019-03-23 NOTE — Progress Notes (Signed)
     Subjective: Chief Complaint  Patient presents with  . leg follow up    HPI: [name] [name] is a 48 y.o. 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 It is much better appearing and with gauze and protective pad in place it is doing much better. He states it is not draining, no pus, less tender, and feels better. Overall, healing well.  DM Patient had elevated A1c at last visit of 10.3. Due to COVID, gyms closed, and eating out more he thinks he can reverse this trend in the next 3 months. After discussion and shared decision making I think it is reasonable to see how well he can do with increasing his activity and changing his diet. We did agree that if it remains over 10 at his next appt in 3 months we would start [place].   ROS noted in HPI.   Past Medical, Surgical, Social, and Family History Reviewed & Updated per EMR.   Pertinent Historical Findings include:   Social History   Tobacco Use  Smoking Status Current Every Day Smoker  . Packs/day: 1.00  . Years: 10.00  . Pack years: 10.00  . Types: Cigarettes  Smokeless Tobacco Never Used    Objective: BP 110/70   Pulse 97   Wt 228 lb (103.4 kg)   SpO2 97%   BMI 30.08 kg/m  Vitals and nursing notes reviewed  Physical Exam Gen: Alert and Oriented x 3, NAD HEENT: Normocephalic, atraumatic Resp: NWOB MSK: Left leg ulcer healing well, no drainage or pus Ext: no clubbing, cyanosis, or edema Skin: warm, dry, intact, no rashes  No results found for this or any previous visit (from the past 72 hour(s)).  Assessment/Plan:  Diabetes mellitus type 2, uncontrolled ([place]) Poorly controlled. Due to shared decision making with patient he wants to trial a 3 month period of diet and exercise before starting a new medication. 1 3 month F/u, repeat A1c - If A1c is >10 at next  visit start [place]  Will call Prosethesis clinic for patient and make sure his new one is ordered  PATIENT EDUCATION PROVIDED: See AVS    Diagnosis and plan along with any newly prescribed medication(s) were discussed in detail with this patient today. The patient verbalized understanding and agreed with the plan. Patient advised if symptoms worsen return to clinic or ER.    No orders of the defined types were placed in this encounter.   No orders of the defined types were placed in this encounter.    [name], DO 03/23/2019, 9:45 AM PGY-2 [place]

## 2019-03-28 ENCOUNTER — Telehealth: Payer: Self-pay | Admitting: Family Medicine

## 2019-03-28 NOTE — Telephone Encounter (Signed)
Patient still needs his prescription for a new prosthetic leg.  He is leaving the information for where to fax it, again.  It goes to [facility], fax # [phone].  The clinic says they never received it.

## 2019-03-30 ENCOUNTER — Other Ambulatory Visit: Payer: Self-pay | Admitting: Family Medicine

## 2019-03-30 NOTE — Progress Notes (Signed)
Sending physical prescription signed by Dr. Andria Frames for a K3 knee prothesis for L BKA. Also sending letter to Prosthetist to give medical reasoning for the need.

## 2019-04-06 ENCOUNTER — Telehealth: Payer: Self-pay | Admitting: Family Medicine

## 2019-04-06 NOTE — Telephone Encounter (Signed)
[name] [name] called from [facility] stating that a prescription that was faxed to them needs to have pt's name and be dated. Please give [name] [name] a call back.

## 2019-05-15 ENCOUNTER — Other Ambulatory Visit: Payer: Self-pay | Admitting: Family Medicine

## 2019-06-01 DIAGNOSIS — Z89512 Acquired absence of left leg below knee: Secondary | ICD-10-CM | POA: Diagnosis not present

## 2019-08-06 ENCOUNTER — Other Ambulatory Visit: Payer: Self-pay | Admitting: Family Medicine

## 2019-10-31 ENCOUNTER — Other Ambulatory Visit: Payer: Self-pay | Admitting: Family Medicine

## 2019-11-22 ENCOUNTER — Ambulatory Visit: Payer: Federal, State, Local not specified - PPO | Attending: Family

## 2019-11-22 DIAGNOSIS — Z23 Encounter for immunization: Secondary | ICD-10-CM

## 2019-11-22 NOTE — Progress Notes (Signed)
   Covid-19 Vaccination Clinic  Name:  [name] [name]    MRN: [id number] DOB: [birth year]  11/22/2019  [name] was observed post Covid-19 immunization for 15 minutes without incidence. He was provided with Vaccine Information Sheet and instruction to access the V-Safe system.   [name] was instructed to call 911 with any severe reactions post vaccine: [name] Difficulty breathing  . Swelling of your face and throat  . A fast heartbeat  . A bad rash all over your body  . Dizziness and weakness    Immunizations Administered    Name Date Dose VIS Date Route   Moderna COVID-19 Vaccine 11/22/2019  4:03 PM 0.5 mL 09/11/2019 Intramuscular   Manufacturer: Moderna   Lot: [id number]   NDC: 61950-932-67

## 2019-12-25 ENCOUNTER — Ambulatory Visit: Payer: Federal, State, Local not specified - PPO | Attending: Family

## 2019-12-25 DIAGNOSIS — Z23 Encounter for immunization: Secondary | ICD-10-CM

## 2019-12-25 NOTE — Progress Notes (Signed)
   Covid-19 Vaccination Clinic  Name:  [name] [name]    MRN: [id number] DOB: 1971-08-05  12/25/2019  Mr. [name] was observed post Covid-19 immunization for 15 minutes without incident. He was provided with Vaccine Information Sheet and instruction to access the V-Safe system.   Mr. [name] was instructed to call 911 with any severe reactions post vaccine: [name] Difficulty breathing  . Swelling of face and throat  . A fast heartbeat  . A bad rash all over body  . Dizziness and weakness   Immunizations Administered    Name Date Dose VIS Date Route   Moderna COVID-19 Vaccine 12/25/2019  2:46 PM 0.5 mL 09/11/2019 Intramuscular   Manufacturer: Moderna   Lot: [id number]   NDC: 74163-845-36

## 2020-02-06 ENCOUNTER — Other Ambulatory Visit: Payer: Self-pay | Admitting: Family Medicine

## 2020-02-06 DIAGNOSIS — E785 Hyperlipidemia, unspecified: Secondary | ICD-10-CM

## 2020-02-06 NOTE — Progress Notes (Signed)
Lipid panel due 

## 2020-02-07 ENCOUNTER — Encounter: Payer: Self-pay | Admitting: Family Medicine

## 2020-02-07 ENCOUNTER — Ambulatory Visit: Payer: Federal, State, Local not specified - PPO | Admitting: Family Medicine

## 2020-02-07 ENCOUNTER — Other Ambulatory Visit: Payer: Self-pay

## 2020-02-07 VITALS — BP 128/72 | HR 109 | Ht 73.0 in | Wt 227.2 lb

## 2020-02-07 DIAGNOSIS — E11649 Type 2 diabetes mellitus with hypoglycemia without coma: Secondary | ICD-10-CM | POA: Diagnosis not present

## 2020-02-07 LAB — POCT GLYCOSYLATED HEMOGLOBIN (HGB A1C): HbA1c, POC (controlled diabetic range): 8.6 % — AB (ref 0.0–7.0)

## 2020-02-07 MED ORDER — METFORMIN HCL 500 MG PO TABS: 1000.0000 mg | ORAL_TABLET | Freq: Two times a day (BID) | ORAL | 0 refills | Status: DC

## 2020-02-07 MED ORDER — VICTOZA 18 MG/3ML ~~LOC~~ SOPN: PEN_INJECTOR | SUBCUTANEOUS | 0 refills | Status: DC

## 2020-02-07 MED ORDER — LISINOPRIL 40 MG PO TABS: 40.0000 mg | ORAL_TABLET | Freq: Every day | ORAL | 0 refills | Status: DC

## 2020-02-07 MED ORDER — HYDROCHLOROTHIAZIDE 25 MG PO TABS: 25.0000 mg | ORAL_TABLET | Freq: Every day | ORAL | 0 refills | Status: DC

## 2020-02-07 MED ORDER — OMEGA-3-ACID ETHYL ESTERS 1 G PO CAPS: ORAL_CAPSULE | ORAL | 0 refills | Status: DC

## 2020-02-07 MED ORDER — EMPAGLIFLOZIN 10 MG PO TABS: 10.0000 mg | ORAL_TABLET | Freq: Every day | ORAL | 3 refills | Status: DC

## 2020-02-07 NOTE — Patient Instructions (Addendum)
It was great to see you today! Thank you for letting me participate in your care!  Today, we discussed your diabetes and I am glad your A1c is better but it is not quite to goal. Please restart Victoza, continue Metformin, and start Jardiance.  Be well, Jules Schick, DO PGY-3, [facility]

## 2020-02-07 NOTE — Progress Notes (Signed)
    SUBJECTIVE:   CHIEF COMPLAINT / HPI:   T2DM Patient presents today for follow up for his A1c. It is improved from last time but still above goal. He has been compliant with medications and has not had any difficulty obtaining them. He does states he has not taken his Victoza in two weeks because he was worried I may change his regimen today. Otherwise, no side effects from taking metformin or victoza. No hypoglycemic events. He has made some improvements in modifying his dietary and exercises habits and is exercising some. He is trying to avoid sweets and carbs and feels like he has done better in the past 3 weeks. He is agreeable to adding another medication as he agrees he needs to get his blood sugar under control.   PERTINENT  PMH / PSH: T2DM, HTN, HLD  OBJECTIVE:   BP 128/72   Pulse (!) 109   Ht 6\' 1"  (1.854 m)   Wt 227 lb 3.2 oz (103.1 kg)   SpO2 96%   BMI 29.98 kg/m   Gen: NAD CV: RRR, no murmurs, normal S1, S2 split Resp: CTAB, no wheezing, rales, or rhonchi, comfortable work of breathing Skin: warm, dry, intact, no rashes  ASSESSMENT/PLAN:   Diabetes mellitus type 2, uncontrolled (HCC) Still not to goal today but did improve. - Cont Metformin 1000mg  BID - Cont Victoza 1.8mg  daily injection - Start Jardiance 10mg  daily - Recheck A1c in 3 months, if not to goal increase Jardiance to 25mg  daily. If he fails to gain adequate control of his blood glucose after 3 months of being on the max dose of jardiance consider starting insulin   Health Maintenance - Patient will benefit by referral to GI for colonoscopy at next appointment. He has declined in the past but reattempts should be made  , DO [facility]

## 2020-02-08 LAB — BASIC METABOLIC PANEL
BUN/Creatinine Ratio: 20 (ref 9–20)
BUN: 18 mg/dL (ref 6–24)
CO2: 20 mmol/L (ref 20–29)
Calcium: 9.9 mg/dL (ref 8.7–10.2)
Chloride: 101 mmol/L (ref 96–106)
Creatinine, Ser: 0.92 mg/dL (ref 0.76–1.27)
GFR calc Af Amer: 112 mL/min/{1.73_m2} (ref >59)
GFR calc non Af Amer: 97 mL/min/{1.73_m2} (ref >59)
Glucose: 180 mg/dL — ABNORMAL HIGH (ref 65–99)
Potassium: 4.2 mmol/L (ref 3.5–5.2)
Sodium: 141 mmol/L (ref 134–144)

## 2020-02-08 NOTE — Assessment & Plan Note (Addendum)
Still not to goal today but did improve. - Cont Metformin 1000mg  BID - Cont Victoza 1.8mg  daily injection - Start Jardiance 10mg  daily - Recheck A1c in 3 months, if not to goal increase Jardiance to 25mg  daily. If he fails to gain adequate control of his blood glucose after 3 months of being on the max dose of jardiance consider starting insulin

## 2020-03-04 DIAGNOSIS — Z89512 Acquired absence of left leg below knee: Secondary | ICD-10-CM | POA: Diagnosis not present

## 2020-03-13 ENCOUNTER — Encounter: Payer: Self-pay | Admitting: Family Medicine

## 2020-03-13 ENCOUNTER — Ambulatory Visit: Payer: Federal, State, Local not specified - PPO | Admitting: Family Medicine

## 2020-03-13 ENCOUNTER — Other Ambulatory Visit: Payer: Self-pay

## 2020-03-13 VITALS — BP 125/80 | HR 95 | Ht 73.0 in | Wt 223.0 lb

## 2020-03-13 DIAGNOSIS — E11649 Type 2 diabetes mellitus with hypoglycemia without coma: Secondary | ICD-10-CM | POA: Diagnosis not present

## 2020-03-13 DIAGNOSIS — E785 Hyperlipidemia, unspecified: Secondary | ICD-10-CM | POA: Diagnosis not present

## 2020-03-13 MED ORDER — OMEGA-3-ACID ETHYL ESTERS 1 G PO CAPS: ORAL_CAPSULE | ORAL | 0 refills | Status: DC

## 2020-03-13 NOTE — Patient Instructions (Signed)
It was great to see you today! Thank you for letting me participate in your care!  Today, we discussed that you are doing well on jardiance. Please continue taking it as prescribed as I will have your A1c rechecked in about 2 months.   If any of your blood work today is abnormal I will call you.  Be well, Jules Schick, DO PGY-3, [facility]

## 2020-03-13 NOTE — Assessment & Plan Note (Signed)
Patient tolerating Jardiance 10mg  well. Continue with current dose and with current medication regimen. - Recheck A1c in 2 months and if not improved increase Jardiance to 25mg  - Checking BMP today

## 2020-03-13 NOTE — Progress Notes (Signed)
° ° °  SUBJECTIVE:   CHIEF COMPLAINT / HPI:   Follow Up on T2DM At last appointment changed medications by adding Jardiance 10mg . Patient endorses no side effects, has not missed any doses, and states he feels well. He even thinks it is helping him lose weight as he states he is down 3 lbs however he has been more active. He can afford Jardiance with the coupon.   HLD Patient last had LDL checked over one year ago. Still taking statin with no reports of muscle pain.  Left Leg Pain Patient is working part time job and finds it very helpful and fulfilling. It keeps him active and engaged. He is unable to work for more than 4 hours at a time as his prosthesis causes pain and he has developed leg blisters and sores leaving it on longer   PERTINENT  PMH / PSH: T2DM, HLD, HTN, Right BKA  OBJECTIVE:   BP 125/80    Pulse 95    Ht 6\' 1"  (1.854 m)    Wt 223 lb (101.2 kg)    SpO2 97%    BMI 29.42 kg/m   Gen: NAD Skin: Intact, no open sores or blisters on the legs  ASSESSMENT/PLAN:   Diabetes mellitus type 2, uncontrolled (HCC) Patient tolerating Jardiance 10mg  well. Continue with current dose and with current medication regimen. - Recheck A1c in 2 months and if not improved increase Jardiance to 25mg  - Checking BMP today  Hyperlipidemia Obtaining lipid panel today  Left Leg Pain - Cont to take 4 hour breaks and do not stand continually on leg or have it on for more than 4 hours at a time.    , DO [facility]

## 2020-03-13 NOTE — Assessment & Plan Note (Signed)
Obtaining lipid panel today. 

## 2020-03-14 LAB — BASIC METABOLIC PANEL
BUN/Creatinine Ratio: 27 — ABNORMAL HIGH (ref 9–20)
BUN: 22 mg/dL (ref 6–24)
CO2: 18 mmol/L — ABNORMAL LOW (ref 20–29)
Calcium: 10.7 mg/dL — ABNORMAL HIGH (ref 8.7–10.2)
Chloride: 101 mmol/L (ref 96–106)
Creatinine, Ser: 0.81 mg/dL (ref 0.76–1.27)
GFR calc Af Amer: 120 mL/min/{1.73_m2} (ref >59)
GFR calc non Af Amer: 104 mL/min/{1.73_m2} (ref >59)
Glucose: 109 mg/dL — ABNORMAL HIGH (ref 65–99)
Potassium: 4 mmol/L (ref 3.5–5.2)
Sodium: 139 mmol/L (ref 134–144)

## 2020-03-14 LAB — LIPID PANEL
Chol/HDL Ratio: 5.9 ratio — ABNORMAL HIGH (ref 0.0–5.0)
Cholesterol, Total: 202 mg/dL — ABNORMAL HIGH (ref 100–199)
HDL: 34 mg/dL — ABNORMAL LOW (ref >39)
LDL Chol Calc (NIH): 94 mg/dL (ref 0–99)
Triglycerides: 446 mg/dL — ABNORMAL HIGH (ref 0–149)
VLDL Cholesterol Cal: 74 mg/dL — ABNORMAL HIGH (ref 5–40)

## 2020-04-23 ENCOUNTER — Other Ambulatory Visit: Payer: Self-pay | Admitting: Family Medicine

## 2020-04-24 ENCOUNTER — Other Ambulatory Visit: Payer: Self-pay

## 2020-05-05 ENCOUNTER — Encounter: Payer: Self-pay | Admitting: Student in an Organized Health Care Education/Training Program

## 2020-07-21 ENCOUNTER — Other Ambulatory Visit: Payer: Self-pay

## 2020-07-21 ENCOUNTER — Ambulatory Visit (INDEPENDENT_AMBULATORY_CARE_PROVIDER_SITE_OTHER)
Payer: Federal, State, Local not specified - PPO | Admitting: Student in an Organized Health Care Education/Training Program

## 2020-07-21 VITALS — BP 126/80 | HR 99 | Ht 73.0 in | Wt 225.6 lb

## 2020-07-21 DIAGNOSIS — Z720 Tobacco use: Secondary | ICD-10-CM | POA: Diagnosis not present

## 2020-07-21 DIAGNOSIS — E11649 Type 2 diabetes mellitus with hypoglycemia without coma: Secondary | ICD-10-CM

## 2020-07-21 LAB — POCT GLYCOSYLATED HEMOGLOBIN (HGB A1C): HbA1c, POC (controlled diabetic range): 7.9 % — AB (ref 0.0–7.0)

## 2020-07-21 MED ORDER — VARENICLINE TARTRATE 0.5 MG PO TABS
0.5000 mg | ORAL_TABLET | Freq: Two times a day (BID) | ORAL | 1 refills | Status: DC
Start: 2020-07-21 — End: 2022-03-25

## 2020-07-21 MED ORDER — METFORMIN HCL 1000 MG PO TABS: ORAL_TABLET | ORAL | 3 refills | Status: DC

## 2020-07-21 MED ORDER — LISINOPRIL 40 MG PO TABS: 40.0000 mg | ORAL_TABLET | Freq: Every day | ORAL | 1 refills | Status: DC

## 2020-07-21 MED ORDER — EMPAGLIFLOZIN 10 MG PO TABS: 10.0000 mg | ORAL_TABLET | Freq: Every day | ORAL | 3 refills | Status: DC

## 2020-07-21 MED ORDER — VICTOZA 18 MG/3ML ~~LOC~~ SOPN: PEN_INJECTOR | SUBCUTANEOUS | 0 refills | Status: DC

## 2020-07-21 MED ORDER — HYDROCHLOROTHIAZIDE 25 MG PO TABS: 25.0000 mg | ORAL_TABLET | Freq: Every day | ORAL | 0 refills | Status: DC

## 2020-07-21 MED ORDER — ATORVASTATIN CALCIUM 40 MG PO TABS: 40.0000 mg | ORAL_TABLET | Freq: Every day | ORAL | 3 refills | Status: DC

## 2020-07-21 NOTE — Progress Notes (Signed)
    SUBJECTIVE:   CHIEF COMPLAINT / HPI: DM f/u  DM- checks blood sugars intermittently throughout the week. Around 160 in am when he has good diet control. Endorses intermittent diet adherence and splurges. Adherent with medications and denies negative side effects.   HLD- discussed starting statin based on recent lipid profile  Tobacco use- patient in planning phase of quitting. Interested in medication assistance  vaccines- 2019 received PNA 23. Received flu and shingrix this year.   Prosthetic- needs prosthetic supply prescription renewed in November.  OBJECTIVE:   BP 126/80   Pulse 99   Ht 6\' 1"  (1.854 m)   Wt 225 lb 9.6 oz (102.3 kg)   SpO2 95%   BMI 29.76 kg/m   General: NAD, pleasant, able to participate in exam Cardiac: RRR, normal heart sounds, no murmurs. 2+ radial and PT pulses bilaterally Respiratory: CTAB, normal effort, No wheezes, rales or rhonchi Abdomen: soft, nontender, nondistended, no hepatic or splenomegaly, +BS Extremities: no edema or cyanosis. WWP. Left amputation Skin: warm and dry, no rashes noted Neuro: alert and oriented, no focal deficits Psych: Normal affect and mood  ASSESSMENT/PLAN:   Diabetes mellitus type 2, uncontrolled (HCC) A1c improved to 7.9 today. Discussed increasing Jardiance vs. Diet changes and patient would like to try to adhere to strict diet/exercise regimen and recheck A1c in 3 months.  - refill jardiance 10mg , victoza 1.8mg  daily - changed dose of metformin to 1,000mg  pills BID to decrease pill burden - initiated atorvastatin 40mg  daily. Repeat lipid panel at f/u visit in 3 months - repeat A1c at f/u in 3 months - received PNA 23 in 2019  Tobacco use - provided with counseling, 1-800-quit now resources - prescribed chantix - f/u via mychart and appointment in 3 months   , DO [facility]

## 2020-07-21 NOTE — Assessment & Plan Note (Signed)
-   provided with counseling, 1-800-quit now resources - prescribed chantix - f/u via mychart and appointment in 3 months

## 2020-07-21 NOTE — Assessment & Plan Note (Signed)
A1c improved to 7.9 today. Discussed increasing Jardiance vs. Diet changes and patient would like to try to adhere to strict diet/exercise regimen and recheck A1c in 3 months.  - refill jardiance 10mg , victoza 1.8mg  daily - changed dose of metformin to 1,000mg  pills BID to decrease pill burden - initiated atorvastatin 40mg  daily. Repeat lipid panel at f/u visit in 3 months - repeat A1c at f/u in 3 months - received PNA 23 in 2019

## 2020-07-21 NOTE — Patient Instructions (Signed)
It was a pleasure to see you today!  To summarize our discussion for this visit:  I'm glad that you are taking steps for quitting smoking! I sent in a prescription to help with that and given you a resource. We'll touch base on your progress at our next appointment  Come back and see me in 3 months for repeat labs on your cholesterol and diabetes.   We are starting a statin medication for your cholesterol  You're going to work on a stricter diet and exercise for your blood sugar control. We may think about increasing your jardiance if not improved at next visit  Let me know about the prescription you need for prosthetic supplies!  Some additional health maintenance measures we should update are: Health Maintenance Due  Topic Date Due  . Hepatitis C Screening  Never done  . OPHTHALMOLOGY EXAM  10/11/2018  . COLONOSCOPY  Never done  . INFLUENZA VACCINE  05/11/2020  .    Call the clinic at [phone] if your symptoms worsen or you have any concerns.   Thank you for allowing me to take part in your care,  Dr. Jamelle Rushing

## 2020-08-13 ENCOUNTER — Encounter: Payer: Self-pay | Admitting: Student in an Organized Health Care Education/Training Program

## 2020-08-15 ENCOUNTER — Encounter: Payer: Self-pay | Admitting: Student in an Organized Health Care Education/Training Program

## 2020-09-18 DIAGNOSIS — Z89512 Acquired absence of left leg below knee: Secondary | ICD-10-CM | POA: Diagnosis not present

## 2020-10-14 ENCOUNTER — Other Ambulatory Visit: Payer: Self-pay | Admitting: Student in an Organized Health Care Education/Training Program

## 2020-10-15 ENCOUNTER — Other Ambulatory Visit: Payer: Self-pay | Admitting: *Deleted

## 2020-11-26 ENCOUNTER — Other Ambulatory Visit: Payer: Self-pay

## 2020-11-26 ENCOUNTER — Encounter: Payer: Self-pay | Admitting: Student in an Organized Health Care Education/Training Program

## 2020-11-26 ENCOUNTER — Ambulatory Visit
Payer: Federal, State, Local not specified - PPO | Admitting: Student in an Organized Health Care Education/Training Program

## 2020-11-26 VITALS — BP 115/70 | HR 102 | Ht 74.0 in | Wt 231.0 lb

## 2020-11-26 DIAGNOSIS — K861 Other chronic pancreatitis: Secondary | ICD-10-CM | POA: Diagnosis not present

## 2020-11-26 DIAGNOSIS — J984 Other disorders of lung: Secondary | ICD-10-CM

## 2020-11-26 DIAGNOSIS — K862 Cyst of pancreas: Secondary | ICD-10-CM

## 2020-11-26 DIAGNOSIS — IMO0001 Reserved for inherently not codable concepts without codable children: Secondary | ICD-10-CM

## 2020-11-26 DIAGNOSIS — E11649 Type 2 diabetes mellitus with hypoglycemia without coma: Secondary | ICD-10-CM

## 2020-11-26 DIAGNOSIS — Z1159 Encounter for screening for other viral diseases: Secondary | ICD-10-CM | POA: Diagnosis not present

## 2020-11-26 DIAGNOSIS — R911 Solitary pulmonary nodule: Secondary | ICD-10-CM

## 2020-11-26 DIAGNOSIS — Z89512 Acquired absence of left leg below knee: Secondary | ICD-10-CM

## 2020-11-26 DIAGNOSIS — Z1211 Encounter for screening for malignant neoplasm of colon: Secondary | ICD-10-CM

## 2020-11-26 LAB — POCT GLYCOSYLATED HEMOGLOBIN (HGB A1C): Hemoglobin A1C: 8.2 % — AB (ref 4.0–5.6)

## 2020-11-26 NOTE — Patient Instructions (Signed)
It was a pleasure to see you today!  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 We are checking some blood work prior to evaluate your kidneys for the contrast  Please send me your disability forms to review  I sent in a pain management referral  Some additional health maintenance measures we should update are: Health Maintenance Due  Topic Date Due  . Hepatitis C Screening  Never done  . COLONOSCOPY (Pts 45-50yrs Insurance coverage will need to be confirmed)  Never done  . OPHTHALMOLOGY EXAM  10/11/2018  . INFLUENZA VACCINE  05/11/2020  .   Please return to our clinic to see me after we discuss your CT results.  Call the clinic at [phone] if your symptoms worsen or you have any concerns.   Thank you for allowing me to take part in your care,  Dr. Jamelle Rushing

## 2020-11-26 NOTE — Progress Notes (Signed)
   SUBJECTIVE:   CHIEF COMPLAINT / HPI: chronic pancreatitis, DM  DM-currently taking Metformin and Jardiance.  At past visit patient declines medication changes as he was going to make strict diet and activity changes.  However, he endorses that he was not very judicious with his diet over the holidays and is not surprised to see his A1c increased today.  Chronic pancreatitis-worsening pain in epigastric and bilateral upper quadrants. - no known gallstone. Not associated with eating. Its constant. Nausea x1 last week. Not taking any medications recently. Requesting referral to pain management for this and leg pain at amputation site. has back stimulator since 2006 2019 abdominal CT showed pancreatic cyst recommending 1 year follow-up  [facility] due- [facility].Bm regular, daily. Some mild diarrhea- did not look to see if there was blood.  Disability-requesting a form for student loans for given this.  Will drop off at front desk when he gets it.   tobacco use-patient is 1.5 PPD smoker for the last 32 years, current smoker.  Expressed interest in cessation previously but did not follow through with treatment.  His wife also urged him to stop smoking  OBJECTIVE:   BP 115/70   Pulse (!) 102   Ht 6\' 2"  (1.88 m)   Wt 231 lb (104.8 kg)   SpO2 97%   BMI 29.66 kg/m   General: NAD, pleasant, able to participate in exam Cardiac: RRR, normal heart sounds, no murmurs. Respiratory: CTAB, normal effort, No wheezes, rales or rhonchi Abdomen: soft, protuberant, no hepatic or splenomegaly, +BS, mildly tender in upper quadrants without guarding or rebound tenderness.  Seems to be worse on right upper quadrant with negative Murphy sign Extremities: no edema. WWP.  Left BKA Skin: warm and dry, no rashes noted Neuro: alert and oriented, no focal deficits Psych: Normal affect and mood  ASSESSMENT/PLAN:   Chronic pancreatitis (HCC) >5 years - referral to pain  medicine - consider nutritional assessment and enzyme replacement -Bmet today -Hep C screening -Repeat abdominal CT  Diabetes mellitus type 2, uncontrolled (HCC) Poorly controlled.  Again, stressed importance of diet and exercise in relation to disease management. Continue Metformin, increase Jardiance Bmet today Recheck A1c 3 months -Abdominal pain worsening could be associated with worsening control of diabetes and gastroparesis  Pulmonary lesion Incidental finding of 3 mm lung nodule in middle lobe in 2019. Due to significant tobacco use history, including repeat chest CT scan when going for abdominal CT  Encounter for screening colonoscopy GI referral placed for colonoscopy and would also like him to follow-up with GI for his chronic pancreatitis. Previously seen at [facility]     [facility], DO [facility]

## 2020-11-27 LAB — HEPATITIS C ANTIBODY: Hep C Virus Ab: <0.1 s/co ratio (ref 0.0–0.9)

## 2020-11-27 LAB — BASIC METABOLIC PANEL
BUN/Creatinine Ratio: 25 — ABNORMAL HIGH (ref 9–20)
BUN: 19 mg/dL (ref 6–24)
CO2: 19 mmol/L — ABNORMAL LOW (ref 20–29)
Calcium: 10.1 mg/dL (ref 8.7–10.2)
Chloride: 100 mmol/L (ref 96–106)
Creatinine, Ser: 0.76 mg/dL (ref 0.76–1.27)
GFR calc Af Amer: 122 mL/min/{1.73_m2} (ref >59)
GFR calc non Af Amer: 106 mL/min/{1.73_m2} (ref >59)
Glucose: 91 mg/dL (ref 65–99)
Potassium: 4.1 mmol/L (ref 3.5–5.2)
Sodium: 140 mmol/L (ref 134–144)

## 2020-11-27 NOTE — Assessment & Plan Note (Signed)
>  5 years - referral to pain medicine - consider nutritional assessment and enzyme replacement -Bmet today -Hep C screening -Repeat abdominal CT

## 2020-11-28 DIAGNOSIS — J984 Other disorders of lung: Secondary | ICD-10-CM | POA: Insufficient documentation

## 2020-11-28 MED ORDER — EMPAGLIFLOZIN 25 MG PO TABS
25.0000 mg | ORAL_TABLET | Freq: Every day | ORAL | 1 refills | Status: DC
Start: 2020-11-28 — End: 2021-05-14

## 2020-11-28 NOTE — Assessment & Plan Note (Signed)
Incidental finding of 3 mm lung nodule in middle lobe in 2019. Due to significant tobacco use history, including repeat chest CT scan when going for abdominal CT

## 2020-11-28 NOTE — Assessment & Plan Note (Addendum)
Poorly controlled.  Again, stressed importance of diet and exercise in relation to disease management. Continue Metformin, increase Jardiance Bmet today Recheck A1c 3 months -Abdominal pain worsening could be associated with worsening control of diabetes and gastroparesis

## 2020-11-28 NOTE — Assessment & Plan Note (Signed)
GI referral placed for colonoscopy and would also like him to follow-up with GI for his chronic pancreatitis. Previously seen at [facility]

## 2020-12-03 ENCOUNTER — Telehealth: Payer: Self-pay | Admitting: *Deleted

## 2020-12-03 DIAGNOSIS — IMO0001 Reserved for inherently not codable concepts without codable children: Secondary | ICD-10-CM

## 2020-12-03 DIAGNOSIS — R911 Solitary pulmonary nodule: Secondary | ICD-10-CM

## 2020-12-03 NOTE — Telephone Encounter (Signed)
Received voicemail from [place] at [facility] regarding upcoming CT chest nodule f/u.  Per her review, patient didn't have an initial ct low dose scan and the order will need to be changed to CT CHEST W/O.  This will show the lungs and allow for nodules to be seen if present.  Will forward to MD to change.  Appointment is on 12-12-20.  Matika Bartell,CMA

## 2020-12-03 NOTE — Telephone Encounter (Signed)
It's a follow up because the nodule was seen on his latest abdominal CT

## 2020-12-03 NOTE — Telephone Encounter (Signed)
The CT was on 01/09/2018 and the type of imaging that I ordered was based on the recommendation of the radiologist that read that report at that time.

## 2020-12-03 NOTE — Telephone Encounter (Signed)
If you would like to keep the order as is.  Please call [facility] to discuss this with their radiologist.  Thanks Burnard Hawthorne

## 2020-12-04 NOTE — Telephone Encounter (Signed)
Recommendations and orders clarified with provider and will be changed.  Tarus Briski,CMA

## 2020-12-04 NOTE — Telephone Encounter (Signed)
That number/extension sent me to medical records voicemail. Tried the main line and unable to reach an actual person

## 2020-12-09 NOTE — Addendum Note (Signed)
Addended by: Leeroy Bock on: 12/09/2020 01:40 PM   Modules accepted: Orders

## 2020-12-10 ENCOUNTER — Encounter: Payer: Self-pay | Admitting: Student in an Organized Health Care Education/Training Program

## 2020-12-12 ENCOUNTER — Inpatient Hospital Stay: Admission: RE | Admit: 2020-12-12 | Payer: Federal, State, Local not specified - PPO | Source: Ambulatory Visit

## 2020-12-22 ENCOUNTER — Other Ambulatory Visit: Payer: Self-pay

## 2020-12-22 ENCOUNTER — Ambulatory Visit
Payer: Federal, State, Local not specified - PPO | Admitting: Student in an Organized Health Care Education/Training Program

## 2020-12-22 ENCOUNTER — Encounter: Payer: Self-pay | Admitting: Student in an Organized Health Care Education/Training Program

## 2020-12-22 VITALS — BP 114/80 | HR 97 | Ht 74.0 in | Wt 224.2 lb

## 2020-12-22 DIAGNOSIS — N529 Male erectile dysfunction, unspecified: Secondary | ICD-10-CM | POA: Diagnosis not present

## 2020-12-22 DIAGNOSIS — E11649 Type 2 diabetes mellitus with hypoglycemia without coma: Secondary | ICD-10-CM

## 2020-12-22 DIAGNOSIS — N5201 Erectile dysfunction due to arterial insufficiency: Secondary | ICD-10-CM | POA: Diagnosis not present

## 2020-12-22 DIAGNOSIS — R262 Difficulty in walking, not elsewhere classified: Secondary | ICD-10-CM | POA: Diagnosis not present

## 2020-12-22 MED ORDER — SILDENAFIL CITRATE 100 MG PO TABS: 50.0000 mg | ORAL_TABLET | Freq: Every day | ORAL | 2 refills | Status: DC | PRN

## 2020-12-22 NOTE — Progress Notes (Signed)
    SUBJECTIVE:   CHIEF COMPLAINT / HPI: forms and CT scans f/u  Follow up on ordered CT scans- patient has not completed recommended CT scans due to issues with imaging center and scheduling/charges.  New information- patient states that he had follow up with GI about his pancreatic cyst and was ruled out with biopsy for benign and he thinks they may have said he didn't need repeat monitoring imaging any more. So, he is going to hold off on that CT until he follows up with GI to ensure he does or doesn't need it. He is going to go ahead with the chest CT for cancer screening. Planning to call to schedule that today  ED- sex drive is down.  No relationship discourse.  No spontaneous erections.  When gets erection, not as strong and doesn't last as long.  No ejaculations in 1.5-2 months.  No problems with urination. strong stream, fully empties bladder.  A couple months ago rapidly changed Has not used medications yet  School forms- requesting disability form completed for school. Brought into appointment today.  OBJECTIVE:   BP 114/80   Pulse 97   Ht 6\' 2"  (1.88 m)   Wt 224 lb 4 oz (101.7 kg)   SpO2 98%   BMI 28.79 kg/m   General: NAD, pleasant, able to participate in exam Cardiac: RRR, normal heart sounds, no murmurs. 2+ radial and PT pulses bilaterally Respiratory: CTAB, normal effort, No wheezes, rales or rhonchi Abdomen: soft, nontender, nondistended, no hepatic or splenomegaly, +BS Extremities: left LE amputation Skin: warm and dry, no rashes noted Neuro: alert and oriented, no focal deficits Psych: Normal affect and mood  ASSESSMENT/PLAN:   Erectile dysfunction Likely vascular and related to poorly controlled diabetes and cholesterol.  Patient has improvement in his diet and some weight loss today since recent visit.  Discussed relation to his symptoms and his chronic conditions.  Will recheck cholesterol today as well as TSH. Printed Viagra prescription and  recommended good rx app to fill. Described escalating dose as needed and goal to use lowest dose that is effective.  Will follow up in 2-3 months to recheck diabetes   Disability of walking Brings in form for disability for school funding.  Will fill out and leave at front desk for pick up, per patient request   Pain referral- not contacted due to being dismissed from the practice previously  Complete chest CT for lung cancer screening. counseling on tobacco cessation  Follow up with GI for colonoscopy and whether needs abdominal CT follow up of pancreatic cyst. Referral placed for request of Dr. , DO [facility]

## 2020-12-22 NOTE — Assessment & Plan Note (Signed)
Brings in form for disability for school funding.  Will fill out and leave at front desk for pick up, per patient request

## 2020-12-22 NOTE — Patient Instructions (Signed)
It was a pleasure to see you today!  To summarize our discussion for this visit:  For your paperwork- I will fill out and leave at front desk within a week and try to message you when ready.   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 We are getting some labs today to investigate the other contributions to this problem but likely related to your high cholesterol and diabetes. Keep up the good work on your diet and increase your exercise as able.     Some additional health maintenance measures we should update are: Health Maintenance Due  Topic Date Due  . COLONOSCOPY (Pts 45-90yrs Insurance coverage will need to be confirmed)  Never done  . OPHTHALMOLOGY EXAM  10/11/2018  .   Please return to our clinic to see me in about 3 months as needed.  Call the clinic at [phone] if your symptoms worsen or you have any concerns.   Thank you for allowing me to take part in your care,  Dr. Jamelle Rushing   Erectile Dysfunction Erectile dysfunction (ED) is the inability to get or keep an erection in order to have sexual intercourse. ED is considered a symptom of an underlying disorder and not considered a disease. Erectile dysfunction may include:  Inability to get an erection.  Lack of enough hardness of the erection to allow penetration.  Loss of the erection before sex is finished. What are the causes? This condition may be caused by:  Certain medicines, such as: ? Pain relievers. ? Antihistamines. ? Antidepressants. ? Blood pressure medicines. ? Water pills (diuretics). ? Ulcer medicines. ? Muscle relaxants. ? Drugs.  Excessive drinking.  Psychological causes, such as: ? Anxiety. ? Depression. ? Sadness. ? Exhaustion. ? Performance fear. ? Stress.  Physical  causes, such as: ? Artery problems. This may include diabetes, smoking, liver disease, or atherosclerosis. ? High blood pressure. ? Hormonal problems, such as low testosterone. ? Obesity. ? Nerve problems. This may include back or pelvic injuries, diabetes mellitus, multiple sclerosis, or Parkinson's disease. What are the signs or symptoms? Symptoms of this condition include:  Inability to get an erection.  Lack of enough hardness of the erection to allow penetration.  Loss of the erection before sex is finished.  Normal erections at some times, but with frequent unsatisfactory episodes.  Low sexual satisfaction in either partner due to erection problems.  A curved penis occurring with erection. The curve may cause pain or the penis may be too curved to allow for intercourse.  Never having nighttime erections. How is this diagnosed? This condition is often diagnosed by:  Performing a physical exam to find other diseases or specific problems with the penis.  Asking you detailed questions about the problem.  Performing blood tests to check for diabetes mellitus or to measure hormone levels.  Performing other tests to check for underlying health conditions.  Performing an ultrasound exam to check for scarring.  Performing a test to check blood flow to the penis.  Doing a sleep study at home to measure nighttime erections. How is this treated? This condition may be treated by:  Medicine taken by mouth to help you achieve an erection (oral medicine).  Hormone replacement therapy to replace low testosterone levels.  Medicine that  is injected into the penis. Your health care provider may instruct you how to give yourself these injections at home.  Vacuum pump. This is a pump with a ring on it. The pump and ring are placed on the penis and used to create pressure that helps the penis become erect.  Penile implant surgery. In this procedure, you may receive: ? An inflatable  implant. This consists of cylinders, a pump, and a reservoir. The cylinders can be inflated with a fluid that helps to create an erection, and they can be deflated after intercourse. ? A semi-rigid implant. This consists of two silicone rubber rods. The rods provide some rigidity. They are also flexible, so the penis can both curve downward in its normal position and become straight for sexual intercourse.  Blood vessel surgery, to improve blood flow to the penis. During this procedure, a blood vessel from a different part of the body is placed into the penis to allow blood to flow around (bypass) damaged or blocked blood vessels.  Lifestyle changes, such as exercising more, losing weight, and quitting smoking. Follow these instructions at home: Medicines  Take over-the-counter and prescription medicines only as told by your health care provider. Do not increase the dosage without first discussing it with your health care provider.  If you are using self-injections, perform injections as directed by your health care provider. Make sure to avoid any veins that are on the surface of the penis. After giving an injection, apply pressure to the injection site for 5 minutes.   General instructions  Exercise regularly, as directed by your health care provider. Work with your health care provider to lose weight, if needed.  Do not use any products that contain nicotine or tobacco, such as cigarettes and e-cigarettes. If you need help quitting, ask your health care provider.  Before using a vacuum pump, read the instructions that come with the pump and discuss any questions with your health care provider.  Keep all follow-up visits as told by your health care provider. This is important. Contact a health care provider if:  You feel nauseous.  You vomit. Get help right away if:  You are taking oral or injectable medicines and you have an erection that lasts longer than 4 hours. If your health care  provider is unavailable, go to the nearest emergency room for evaluation. An erection that lasts much longer than 4 hours can result in permanent damage to your penis.  You have severe pain in your groin or abdomen.  You develop redness or severe swelling of your penis.  You have redness spreading up into your groin or lower abdomen.  You are unable to urinate.  You experience chest pain or a rapid heart beat (palpitations) after taking oral medicines. Summary  Erectile dysfunction (ED) is the inability to get or keep an erection during sexual intercourse. This problem can usually be treated successfully.  This condition is diagnosed based on a physical exam, your symptoms, and tests to determine the cause. Treatment varies depending on the cause and may include medicines, hormone therapy, surgery, or a vacuum pump.  You may need follow-up visits to make sure that you are using your medicines or devices correctly.  Get help right away if you are taking or injecting medicines and you have an erection that lasts longer than 4 hours. This information is not intended to replace advice given to you by your health care provider. Make sure you discuss any questions you have with  your health care provider. Document Revised: 06/13/2020 Document Reviewed: 06/13/2020 Elsevier Patient Education  2021 [employer].

## 2020-12-22 NOTE — Assessment & Plan Note (Signed)
Likely vascular and related to poorly controlled diabetes and cholesterol.  Patient has improvement in his diet and some weight loss today since recent visit.  Discussed relation to his symptoms and his chronic conditions.  Will recheck cholesterol today as well as TSH. Printed Viagra prescription and recommended good rx app to fill. Described escalating dose as needed and goal to use lowest dose that is effective.  Will follow up in 2-3 months to recheck diabetes

## 2020-12-23 ENCOUNTER — Encounter: Payer: Self-pay | Admitting: Student in an Organized Health Care Education/Training Program

## 2020-12-23 LAB — BASIC METABOLIC PANEL
BUN/Creatinine Ratio: 29 — ABNORMAL HIGH (ref 9–20)
BUN: 23 mg/dL (ref 6–24)
CO2: 19 mmol/L — ABNORMAL LOW (ref 20–29)
Calcium: 10 mg/dL (ref 8.7–10.2)
Chloride: 100 mmol/L (ref 96–106)
Creatinine, Ser: 0.8 mg/dL (ref 0.76–1.27)
Glucose: 113 mg/dL — ABNORMAL HIGH (ref 65–99)
Potassium: 4.2 mmol/L (ref 3.5–5.2)
Sodium: 139 mmol/L (ref 134–144)
eGFR: 107 mL/min/{1.73_m2} (ref >59)

## 2020-12-23 LAB — LIPID PANEL
Chol/HDL Ratio: 4.4 ratio (ref 0.0–5.0)
Cholesterol, Total: 131 mg/dL (ref 100–199)
HDL: 30 mg/dL — ABNORMAL LOW (ref >39)
LDL Chol Calc (NIH): 40 mg/dL (ref 0–99)
Triglycerides: 422 mg/dL — ABNORMAL HIGH (ref 0–149)
VLDL Cholesterol Cal: 61 mg/dL — ABNORMAL HIGH (ref 5–40)

## 2020-12-23 LAB — TSH: TSH: 0.91 u[IU]/mL (ref 0.450–4.500)

## 2020-12-24 ENCOUNTER — Encounter: Payer: Self-pay | Admitting: Student in an Organized Health Care Education/Training Program

## 2020-12-30 ENCOUNTER — Encounter: Payer: Self-pay | Admitting: Student in an Organized Health Care Education/Training Program

## 2020-12-31 ENCOUNTER — Other Ambulatory Visit: Payer: Self-pay | Admitting: Student in an Organized Health Care Education/Training Program

## 2021-01-01 ENCOUNTER — Other Ambulatory Visit: Payer: Self-pay | Admitting: *Deleted

## 2021-01-05 ENCOUNTER — Telehealth: Payer: Self-pay | Admitting: Student in an Organized Health Care Education/Training Program

## 2021-01-05 MED ORDER — VICTOZA 18 MG/3ML ~~LOC~~ SOPN: PEN_INJECTOR | SUBCUTANEOUS | 0 refills | Status: DC

## 2021-01-05 NOTE — Telephone Encounter (Signed)
Patient stopped by today again trying to get his form filled out he states Dr Dareen Piano would complete form.  He states this was supposed to be done last week don't see it up at front desk yet.  Can you please call patient and let him know what going on. Paperwork is for school for his disability.

## 2021-01-06 ENCOUNTER — Encounter: Payer: Self-pay | Admitting: Student in an Organized Health Care Education/Training Program

## 2021-01-28 DIAGNOSIS — G894 Chronic pain syndrome: Secondary | ICD-10-CM | POA: Diagnosis not present

## 2021-01-28 DIAGNOSIS — T8789 Other complications of amputation stump: Secondary | ICD-10-CM | POA: Diagnosis not present

## 2021-01-28 DIAGNOSIS — M961 Postlaminectomy syndrome, not elsewhere classified: Secondary | ICD-10-CM | POA: Diagnosis not present

## 2021-01-28 DIAGNOSIS — Z79899 Other long term (current) drug therapy: Secondary | ICD-10-CM | POA: Diagnosis not present

## 2021-01-28 DIAGNOSIS — Z79891 Long term (current) use of opiate analgesic: Secondary | ICD-10-CM | POA: Diagnosis not present

## 2021-01-28 DIAGNOSIS — Z981 Arthrodesis status: Secondary | ICD-10-CM | POA: Diagnosis not present

## 2021-02-25 DIAGNOSIS — T8789 Other complications of amputation stump: Secondary | ICD-10-CM | POA: Diagnosis not present

## 2021-02-25 DIAGNOSIS — G894 Chronic pain syndrome: Secondary | ICD-10-CM | POA: Diagnosis not present

## 2021-02-25 DIAGNOSIS — M545 Low back pain, unspecified: Secondary | ICD-10-CM | POA: Diagnosis not present

## 2021-02-25 DIAGNOSIS — M961 Postlaminectomy syndrome, not elsewhere classified: Secondary | ICD-10-CM | POA: Diagnosis not present

## 2021-03-26 ENCOUNTER — Other Ambulatory Visit: Payer: Self-pay | Admitting: Student in an Organized Health Care Education/Training Program

## 2021-03-26 DIAGNOSIS — G894 Chronic pain syndrome: Secondary | ICD-10-CM | POA: Diagnosis not present

## 2021-03-26 DIAGNOSIS — M961 Postlaminectomy syndrome, not elsewhere classified: Secondary | ICD-10-CM | POA: Diagnosis not present

## 2021-03-26 DIAGNOSIS — Z79891 Long term (current) use of opiate analgesic: Secondary | ICD-10-CM | POA: Diagnosis not present

## 2021-03-26 DIAGNOSIS — T8789 Other complications of amputation stump: Secondary | ICD-10-CM | POA: Diagnosis not present

## 2021-03-26 DIAGNOSIS — Z79899 Other long term (current) drug therapy: Secondary | ICD-10-CM | POA: Diagnosis not present

## 2021-04-23 DIAGNOSIS — G894 Chronic pain syndrome: Secondary | ICD-10-CM | POA: Diagnosis not present

## 2021-04-23 DIAGNOSIS — M961 Postlaminectomy syndrome, not elsewhere classified: Secondary | ICD-10-CM | POA: Diagnosis not present

## 2021-04-23 DIAGNOSIS — Z981 Arthrodesis status: Secondary | ICD-10-CM | POA: Diagnosis not present

## 2021-04-23 DIAGNOSIS — Z79891 Long term (current) use of opiate analgesic: Secondary | ICD-10-CM | POA: Diagnosis not present

## 2021-05-11 ENCOUNTER — Other Ambulatory Visit: Payer: Self-pay | Admitting: Student in an Organized Health Care Education/Training Program

## 2021-05-11 DIAGNOSIS — E11649 Type 2 diabetes mellitus with hypoglycemia without coma: Secondary | ICD-10-CM

## 2021-05-14 ENCOUNTER — Encounter: Payer: Self-pay | Admitting: Student

## 2021-05-19 DIAGNOSIS — F17210 Nicotine dependence, cigarettes, uncomplicated: Secondary | ICD-10-CM | POA: Diagnosis not present

## 2021-05-19 DIAGNOSIS — M961 Postlaminectomy syndrome, not elsewhere classified: Secondary | ICD-10-CM | POA: Diagnosis not present

## 2021-05-19 DIAGNOSIS — Z89512 Acquired absence of left leg below knee: Secondary | ICD-10-CM | POA: Diagnosis not present

## 2021-05-19 DIAGNOSIS — T85112A Breakdown (mechanical) of implanted electronic neurostimulator (electrode) of spinal cord, initial encounter: Secondary | ICD-10-CM | POA: Diagnosis not present

## 2021-05-19 DIAGNOSIS — M546 Pain in thoracic spine: Secondary | ICD-10-CM | POA: Diagnosis not present

## 2021-05-19 DIAGNOSIS — Z4542 Encounter for adjustment and management of neuropacemaker (brain) (peripheral nerve) (spinal cord): Secondary | ICD-10-CM | POA: Diagnosis not present

## 2021-05-19 DIAGNOSIS — T85122A Displacement of implanted electronic neurostimulator (electrode) of spinal cord, initial encounter: Secondary | ICD-10-CM | POA: Diagnosis not present

## 2021-05-19 DIAGNOSIS — I10 Essential (primary) hypertension: Secondary | ICD-10-CM | POA: Diagnosis not present

## 2021-05-19 DIAGNOSIS — T85113A Breakdown (mechanical) of implanted electronic neurostimulator, generator, initial encounter: Secondary | ICD-10-CM | POA: Diagnosis not present

## 2021-05-19 DIAGNOSIS — Z981 Arthrodesis status: Secondary | ICD-10-CM | POA: Diagnosis not present

## 2021-05-19 DIAGNOSIS — Z9682 Presence of neurostimulator: Secondary | ICD-10-CM | POA: Diagnosis not present

## 2021-05-19 DIAGNOSIS — G894 Chronic pain syndrome: Secondary | ICD-10-CM | POA: Diagnosis not present

## 2021-06-03 DIAGNOSIS — Z79891 Long term (current) use of opiate analgesic: Secondary | ICD-10-CM | POA: Diagnosis not present

## 2021-06-03 DIAGNOSIS — Z79899 Other long term (current) drug therapy: Secondary | ICD-10-CM | POA: Diagnosis not present

## 2021-06-03 DIAGNOSIS — G894 Chronic pain syndrome: Secondary | ICD-10-CM | POA: Diagnosis not present

## 2021-06-23 ENCOUNTER — Other Ambulatory Visit: Payer: Self-pay | Admitting: Student in an Organized Health Care Education/Training Program

## 2021-07-08 DIAGNOSIS — M961 Postlaminectomy syndrome, not elsewhere classified: Secondary | ICD-10-CM | POA: Diagnosis not present

## 2021-07-08 DIAGNOSIS — Z9689 Presence of other specified functional implants: Secondary | ICD-10-CM | POA: Diagnosis not present

## 2021-07-08 DIAGNOSIS — T8789 Other complications of amputation stump: Secondary | ICD-10-CM | POA: Diagnosis not present

## 2021-07-08 DIAGNOSIS — G894 Chronic pain syndrome: Secondary | ICD-10-CM | POA: Diagnosis not present

## 2021-07-13 MED ORDER — METFORMIN HCL 1000 MG PO TABS: ORAL_TABLET | ORAL | 0 refills | Status: DC

## 2021-07-13 MED ORDER — LISINOPRIL 40 MG PO TABS: 40.0000 mg | ORAL_TABLET | Freq: Every day | ORAL | 0 refills | Status: DC

## 2021-07-13 NOTE — Addendum Note (Signed)
Addended by: Howard Pouch on: 07/13/2021 03:50 PM   Modules accepted: Orders

## 2021-07-28 ENCOUNTER — Other Ambulatory Visit: Payer: Self-pay | Admitting: Student in an Organized Health Care Education/Training Program

## 2021-07-28 ENCOUNTER — Other Ambulatory Visit: Payer: Self-pay | Admitting: Student

## 2021-07-28 DIAGNOSIS — E11649 Type 2 diabetes mellitus with hypoglycemia without coma: Secondary | ICD-10-CM

## 2021-08-03 ENCOUNTER — Other Ambulatory Visit: Payer: Self-pay | Admitting: Student in an Organized Health Care Education/Training Program

## 2021-08-12 ENCOUNTER — Other Ambulatory Visit: Payer: Self-pay

## 2021-08-12 ENCOUNTER — Encounter: Payer: Self-pay | Admitting: Student

## 2021-08-12 ENCOUNTER — Ambulatory Visit (INDEPENDENT_AMBULATORY_CARE_PROVIDER_SITE_OTHER): Payer: Federal, State, Local not specified - PPO | Admitting: Student

## 2021-08-12 VITALS — BP 136/82 | HR 117 | Ht 73.0 in | Wt 216.4 lb

## 2021-08-12 DIAGNOSIS — R262 Difficulty in walking, not elsewhere classified: Secondary | ICD-10-CM

## 2021-08-12 DIAGNOSIS — E11649 Type 2 diabetes mellitus with hypoglycemia without coma: Secondary | ICD-10-CM

## 2021-08-12 LAB — POCT GLYCOSYLATED HEMOGLOBIN (HGB A1C): HbA1c, POC (controlled diabetic range): 7.3 % — AB (ref 0.0–7.0)

## 2021-08-12 NOTE — Progress Notes (Signed)
    SUBJECTIVE:   CHIEF COMPLAINT / HPI: Needs new prosthetic leg/foot  PERTINENT  PMH / PSH: Hypertension, HLD, type 2 diabetes, s/p BKA  S/P BKA Pt is s/p BKA in 2010. Pt states his prosthetic leg is not fitting well for past couple months, too much room.  Has had this prosthetic leg for past 2 years,  and prosthetic foot for past 4 years.  Feels like foot may break.  He cannot exercise as he would like to and gets sharp pains in his stump with ambulation.  Diabetes Checks sugars typically once a day after eating, run between 150-240.  Patient is currently taking Jardiance 25 mg 1 tablet once a day, Victoza 1.8 mg subcutaneous every morning, metformin 1000 mg 1 tablet by mouth twice daily.  OBJECTIVE:   BP 136/82   Pulse (!) 117   Ht 6\' 1"  (1.854 m)   Wt 216 lb 6.4 oz (98.2 kg)   SpO2 99%   BMI 28.55 kg/m    General: NAD, pleasant, able to participate in exam Cardiac: Well-perfused Respiratory: Breathing comfortably on room air Extremities: S/p left BKA.  Erythematous areas on stump from L BKA due to pressure from prosthetic leg.  See photo in media tab Skin: warm and dry, no rashes noted Neuro: alert, no obvious focal deficits Psych: Normal affect and mood  ASSESSMENT/PLAN:   S/p BKA -DME order sent for evaluation of new prosthetic leg/foot   T2DM A1c today of 7.3, down from 8.2 on 11/26/2020.  Patient was congratulated for doing so well with his sugars and encouraged to continue his current regimen.  Patient was advised that he does not need to check his sugars multiple times a day but that he should check his sugars if he feels unwell.  -Continue Jardiance 25 mg daily -Continue Victoza 1.8 mg subcutaneously every morning -Continue metformin 1000 mg twice daily -Repeat A1c in 3 months.   Dr. 11/28/2020, DO [place] [facility]

## 2021-08-12 NOTE — Patient Instructions (Signed)
It was great to see you! Thank you for allowing me to participate in your care!  I recommend that you always bring your medications to each appointment as this makes it easy to ensure you are on the correct medications and helps [place] not miss when refills are needed.  Our plans for today:  -I have put in an order for you to be evaluated for a new prosthetic leg.  After this evaluation, I will be able to order what you need.  Take care and seek immediate care sooner if you develop any concerns.   Dr. Erick Alley, DO [facility]

## 2021-08-12 NOTE — Assessment & Plan Note (Addendum)
A1c today of 7.3, down from 8.2 on 11/26/2020.  Patient was congratulated for doing so well with his sugars and encouraged to continue his current regimen.  Patient was advised that he does not need to check his sugars multiple times a day but that he should check his sugars if he feels unwell. -Continue Jardiance 25 mg daily -Continue Victoza 1.8 mg subcutaneously every morning -Continue metformin 1000 mg twice daily -Repeat A1c in 3 months.

## 2021-08-13 ENCOUNTER — Encounter: Payer: Self-pay | Admitting: Student

## 2021-08-14 ENCOUNTER — Encounter: Payer: Self-pay | Admitting: Student

## 2021-08-20 DIAGNOSIS — G894 Chronic pain syndrome: Secondary | ICD-10-CM | POA: Diagnosis not present

## 2021-08-20 DIAGNOSIS — Z79891 Long term (current) use of opiate analgesic: Secondary | ICD-10-CM | POA: Diagnosis not present

## 2021-08-20 DIAGNOSIS — Z79899 Other long term (current) drug therapy: Secondary | ICD-10-CM | POA: Diagnosis not present

## 2021-08-20 DIAGNOSIS — M961 Postlaminectomy syndrome, not elsewhere classified: Secondary | ICD-10-CM | POA: Diagnosis not present

## 2021-08-20 DIAGNOSIS — T8789 Other complications of amputation stump: Secondary | ICD-10-CM | POA: Diagnosis not present

## 2021-08-20 DIAGNOSIS — Z89512 Acquired absence of left leg below knee: Secondary | ICD-10-CM | POA: Diagnosis not present

## 2021-08-20 DIAGNOSIS — Z9689 Presence of other specified functional implants: Secondary | ICD-10-CM | POA: Diagnosis not present

## 2021-08-25 ENCOUNTER — Emergency Department (HOSPITAL_BASED_OUTPATIENT_CLINIC_OR_DEPARTMENT_OTHER)
Admission: EM | Admit: 2021-08-25 | Discharge: 2021-08-25 | Disposition: A | Discharge status: Home / Self Care | Payer: Federal, State, Local not specified - PPO | Attending: Emergency Medicine | Admitting: Emergency Medicine

## 2021-08-25 ENCOUNTER — Encounter (HOSPITAL_BASED_OUTPATIENT_CLINIC_OR_DEPARTMENT_OTHER): Payer: Self-pay | Admitting: Emergency Medicine

## 2021-08-25 ENCOUNTER — Other Ambulatory Visit: Payer: Self-pay

## 2021-08-25 DIAGNOSIS — Z7984 Long term (current) use of oral hypoglycemic drugs: Secondary | ICD-10-CM | POA: Diagnosis not present

## 2021-08-25 DIAGNOSIS — L089 Local infection of the skin and subcutaneous tissue, unspecified: Secondary | ICD-10-CM

## 2021-08-25 DIAGNOSIS — X58XXXA Exposure to other specified factors, initial encounter: Secondary | ICD-10-CM | POA: Insufficient documentation

## 2021-08-25 DIAGNOSIS — Z79899 Other long term (current) drug therapy: Secondary | ICD-10-CM | POA: Insufficient documentation

## 2021-08-25 DIAGNOSIS — Y9389 Activity, other specified: Secondary | ICD-10-CM | POA: Insufficient documentation

## 2021-08-25 DIAGNOSIS — Y990 Civilian activity done for income or pay: Secondary | ICD-10-CM | POA: Diagnosis not present

## 2021-08-25 DIAGNOSIS — S8991XA Unspecified injury of right lower leg, initial encounter: Secondary | ICD-10-CM | POA: Diagnosis not present

## 2021-08-25 DIAGNOSIS — Z794 Long term (current) use of insulin: Secondary | ICD-10-CM | POA: Diagnosis not present

## 2021-08-25 DIAGNOSIS — L03115 Cellulitis of right lower limb: Secondary | ICD-10-CM | POA: Diagnosis not present

## 2021-08-25 DIAGNOSIS — E119 Type 2 diabetes mellitus without complications: Secondary | ICD-10-CM | POA: Insufficient documentation

## 2021-08-25 DIAGNOSIS — F17210 Nicotine dependence, cigarettes, uncomplicated: Secondary | ICD-10-CM | POA: Insufficient documentation

## 2021-08-25 DIAGNOSIS — I10 Essential (primary) hypertension: Secondary | ICD-10-CM | POA: Diagnosis not present

## 2021-08-25 DIAGNOSIS — S80811A Abrasion, right lower leg, initial encounter: Secondary | ICD-10-CM | POA: Insufficient documentation

## 2021-08-25 MED ORDER — DOXYCYCLINE HYCLATE 100 MG PO CAPS: 100.0000 mg | ORAL_CAPSULE | Freq: Two times a day (BID) | ORAL | 0 refills | Status: DC

## 2021-08-25 NOTE — ED Triage Notes (Signed)
Pt arrives to ED with c/o of possible infection to wound on left inner leg. This started x6 days ago when he cut it on concrete blocks at work.
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
## 2021-08-28 ENCOUNTER — Ambulatory Visit: Payer: Federal, State, Local not specified - PPO

## 2021-09-01 ENCOUNTER — Other Ambulatory Visit: Payer: Self-pay

## 2021-09-01 ENCOUNTER — Ambulatory Visit (INDEPENDENT_AMBULATORY_CARE_PROVIDER_SITE_OTHER): Payer: Federal, State, Local not specified - PPO | Admitting: Family Medicine

## 2021-09-01 VITALS — BP 120/78 | HR 102 | Wt 219.5 lb

## 2021-09-01 DIAGNOSIS — L089 Local infection of the skin and subcutaneous tissue, unspecified: Secondary | ICD-10-CM

## 2021-09-01 DIAGNOSIS — Z1211 Encounter for screening for malignant neoplasm of colon: Secondary | ICD-10-CM

## 2021-09-01 DIAGNOSIS — S80811D Abrasion, right lower leg, subsequent encounter: Secondary | ICD-10-CM

## 2021-09-01 NOTE — Progress Notes (Signed)
    SUBJECTIVE:   CHIEF COMPLAINT / HPI:   Skin Infection, Right Leg Patient was seen in the ED on 11/15 for abrasion of right lower leg with surrounding redness. He was concerned about developing an infection given his history of L BKA (which he reports was related to MRSA infection).  Per ED note, there was a linear abrasion with surrounding erythema and induration. He was prescribed doxycycline x10 days. Reports he has been taking the doxy and has ~3 days remaining. The area appears to be healing well and he has not had fever, chills, drainage, worsening redness, etc.  Health Maintenance Due for colonoscopy (never done). Reports he was seen by [facility] many years ago but owes them money so they won't see him. He requests referral to be sent elsewhere.  PERTINENT  PMH / PSH: chronic pancreatitis, T2DM, HTN, HLD, s/p L BKA (hx traumatic compartment syndrome), tobacco use  OBJECTIVE:   BP 120/78   Pulse (!) 102   Wt 219 lb 8 oz (99.6 kg)   SpO2 99%   BMI 28.96 kg/m   General: NAD, pleasant, able to participate in exam Respiratory: No respiratory distress Skin: warm and dry Psych: Normal affect and mood Neuro: grossly intact R ankle: linear abrasion as below. No increased warmth, no surround erythema or induration, no drainage     ASSESSMENT/PLAN:   Infected Abrasion of R Lower Leg Resolving. Appears to be healing quite well on exam today. No significant erythema, induration or drainage. No increased warmth, no systemic symptoms. -Advised to complete remainder of doxycycline -Apply vaseline or OTC antibiotic ointment and keep area covered (given region is prone to trauma, re-injury) -Return precautions discussed  Health Maintenance -Referral to GI for screening colonoscopy. Will see if we can refer to [place].    Maury Dus, MD [facility]

## 2021-09-01 NOTE — Patient Instructions (Addendum)
It was great to meet you! Your leg is healing nicely.   [employer] your entire course of antibiotics -Apply a small amount of ointment (either vaseline, aquaphor, polysporin, or neosporin are fine) to the wound and keep it covered for the next several days until it heals -If you develop fever, chills, worsening redness or drainage please let us know  -I will look into your colonoscopy situation and place the appropriate referral.   Take care, Dr. Anner Crete

## 2021-09-11 ENCOUNTER — Other Ambulatory Visit: Payer: Self-pay | Admitting: Student

## 2021-09-11 ENCOUNTER — Other Ambulatory Visit: Payer: Self-pay | Admitting: Student in an Organized Health Care Education/Training Program

## 2021-09-22 DIAGNOSIS — M961 Postlaminectomy syndrome, not elsewhere classified: Secondary | ICD-10-CM | POA: Diagnosis not present

## 2021-09-22 DIAGNOSIS — T8789 Other complications of amputation stump: Secondary | ICD-10-CM | POA: Diagnosis not present

## 2021-09-22 DIAGNOSIS — Z9689 Presence of other specified functional implants: Secondary | ICD-10-CM | POA: Diagnosis not present

## 2021-10-09 DIAGNOSIS — Z89512 Acquired absence of left leg below knee: Secondary | ICD-10-CM | POA: Diagnosis not present

## 2021-10-14 ENCOUNTER — Other Ambulatory Visit: Payer: Self-pay | Admitting: Student

## 2021-10-14 DIAGNOSIS — E11649 Type 2 diabetes mellitus with hypoglycemia without coma: Secondary | ICD-10-CM

## 2021-10-22 DIAGNOSIS — Z89512 Acquired absence of left leg below knee: Secondary | ICD-10-CM | POA: Diagnosis not present

## 2021-10-22 DIAGNOSIS — T8789 Other complications of amputation stump: Secondary | ICD-10-CM | POA: Diagnosis not present

## 2021-10-22 DIAGNOSIS — Z9689 Presence of other specified functional implants: Secondary | ICD-10-CM | POA: Diagnosis not present

## 2021-10-22 DIAGNOSIS — M961 Postlaminectomy syndrome, not elsewhere classified: Secondary | ICD-10-CM | POA: Diagnosis not present

## 2021-11-17 ENCOUNTER — Other Ambulatory Visit: Payer: Self-pay | Admitting: Student

## 2021-11-21 ENCOUNTER — Other Ambulatory Visit: Payer: Self-pay | Admitting: Student

## 2021-11-22 ENCOUNTER — Encounter: Payer: Self-pay | Admitting: Student

## 2021-12-06 ENCOUNTER — Other Ambulatory Visit: Payer: Self-pay | Admitting: Student

## 2021-12-10 ENCOUNTER — Other Ambulatory Visit: Payer: Self-pay | Admitting: Student

## 2021-12-10 DIAGNOSIS — Z9689 Presence of other specified functional implants: Secondary | ICD-10-CM | POA: Diagnosis not present

## 2021-12-10 DIAGNOSIS — T8789 Other complications of amputation stump: Secondary | ICD-10-CM | POA: Diagnosis not present

## 2021-12-10 DIAGNOSIS — G894 Chronic pain syndrome: Secondary | ICD-10-CM | POA: Diagnosis not present

## 2021-12-10 DIAGNOSIS — Z79891 Long term (current) use of opiate analgesic: Secondary | ICD-10-CM | POA: Diagnosis not present

## 2021-12-10 DIAGNOSIS — M961 Postlaminectomy syndrome, not elsewhere classified: Secondary | ICD-10-CM | POA: Diagnosis not present

## 2021-12-10 MED ORDER — VICTOZA 18 MG/3ML ~~LOC~~ SOPN: PEN_INJECTOR | SUBCUTANEOUS | 0 refills | Status: DC

## 2021-12-11 ENCOUNTER — Encounter: Payer: Self-pay | Admitting: Student

## 2021-12-11 ENCOUNTER — Other Ambulatory Visit: Payer: Self-pay | Admitting: Student

## 2021-12-15 ENCOUNTER — Other Ambulatory Visit: Payer: Self-pay | Admitting: Student

## 2021-12-15 ENCOUNTER — Encounter: Payer: Self-pay | Admitting: Student

## 2022-01-03 ENCOUNTER — Other Ambulatory Visit: Payer: Self-pay | Admitting: Student

## 2022-01-03 DIAGNOSIS — E11649 Type 2 diabetes mellitus with hypoglycemia without coma: Secondary | ICD-10-CM

## 2022-01-05 ENCOUNTER — Other Ambulatory Visit: Payer: Self-pay | Admitting: Student

## 2022-01-13 DIAGNOSIS — G546 Phantom limb syndrome with pain: Secondary | ICD-10-CM | POA: Diagnosis not present

## 2022-02-07 ENCOUNTER — Other Ambulatory Visit: Payer: Self-pay | Admitting: Student

## 2022-02-12 DIAGNOSIS — G546 Phantom limb syndrome with pain: Secondary | ICD-10-CM | POA: Diagnosis not present

## 2022-03-01 ENCOUNTER — Other Ambulatory Visit: Payer: Self-pay | Admitting: Student

## 2022-03-12 ENCOUNTER — Other Ambulatory Visit: Payer: Self-pay | Admitting: Student

## 2022-03-12 DIAGNOSIS — G546 Phantom limb syndrome with pain: Secondary | ICD-10-CM | POA: Diagnosis not present

## 2022-03-12 DIAGNOSIS — Z79891 Long term (current) use of opiate analgesic: Secondary | ICD-10-CM | POA: Diagnosis not present

## 2022-03-16 ENCOUNTER — Encounter: Payer: Self-pay | Admitting: *Deleted

## 2022-03-23 NOTE — Progress Notes (Signed)
    SUBJECTIVE:   CHIEF COMPLAINT / HPI:    S/P BKA 2010 Pt states he needs new prosthetic liner and socks.   T2DM Pt currently takes jardiance 25 mg daily, metformin 1000 mg BID, and victoza 1.8 mg daily. Last Hgb A1c 7.3 in Nov. 2022. Hgb A1c 8 today. Pt states he would like to watch his diet better to get A1C down.   HTN Pt currently takes lisinopril 40 mg daily and HCTZ 25 mg daily. Last BMP over a year ago. Is also on a statin, will check CMP today. Checks BP at home on occasion, has been in 130's/70s. Elevated today, but drank and energy drink prior to coming.   Fatigue  For past 6 month, pt has had fatigue. States he sleeps well. Drinks 2-3 sugar free red bull or [employer] daily which helps. He also complains of erectile dysfunction.   Nausea Has been feeling nauseous on and off for past few years, states d/t chronic pancreatitis. He sometimes vomits and has abdominal pain. Over past month has felt nauseous 2-3 times with no vomiting. No fevers, no change in appetite. Gets constipated sometimes, takes OTC dolca** which works.    PERTINENT  PMH / PSH: HTN, T2DM, L BKA  OBJECTIVE:   Vitals:   03/25/22 1338 03/25/22 1406  BP: (!) 155/88 133/86  Pulse: (!) 109 (!) 107  SpO2: 99%      General: NAD, pleasant, able to participate in exam Cardiac: RRR, no murmurs. Respiratory: CTAB, normal effort, No wheezes, rales or rhonchi Abdomen: Bowel sounds present, nontender, nondistended, soft Extremities: no edema or erythema of BKA stump Skin: warm and dry, no rashes noted Neuro: alert, no obvious focal deficits Psych: Normal affect and mood  ASSESSMENT/PLAN:  Hypertension Repeat BMP improved. -Continue current regimen  Diabetes mellitus 2, uncontrolled A1c of 8 today, goal of 7 or less.  Patient prefers to follow diabetic diet over the next 3 months and recheck.  -Continue current medications  S/p BKA -Order for prosthetic liners and socks sent  Fatigue Patient  encouraged to stop smoking, stop drinking energy drinks with limit of 1/day if he must have 1.  We will check CBC, TSH and CMP.  Chronic pancreatitis Likely cause of patient's occasional nausea. -Zofran 8 mg every 8 hours as needed  Tobacco cessation Chantix represcribed.  We will further discuss tobacco cessation at next visit.  Dr. Precious Gilding, [place]

## 2022-03-25 ENCOUNTER — Other Ambulatory Visit: Payer: Self-pay

## 2022-03-25 ENCOUNTER — Encounter: Payer: Self-pay | Admitting: Student

## 2022-03-25 ENCOUNTER — Ambulatory Visit: Payer: Federal, State, Local not specified - PPO | Admitting: Student

## 2022-03-25 VITALS — BP 133/86 | HR 107 | Wt 214.0 lb

## 2022-03-25 DIAGNOSIS — K861 Other chronic pancreatitis: Secondary | ICD-10-CM | POA: Diagnosis not present

## 2022-03-25 DIAGNOSIS — I10 Essential (primary) hypertension: Secondary | ICD-10-CM

## 2022-03-25 DIAGNOSIS — Z89512 Acquired absence of left leg below knee: Secondary | ICD-10-CM

## 2022-03-25 DIAGNOSIS — Z720 Tobacco use: Secondary | ICD-10-CM

## 2022-03-25 DIAGNOSIS — E785 Hyperlipidemia, unspecified: Secondary | ICD-10-CM

## 2022-03-25 DIAGNOSIS — N529 Male erectile dysfunction, unspecified: Secondary | ICD-10-CM

## 2022-03-25 DIAGNOSIS — E119 Type 2 diabetes mellitus without complications: Secondary | ICD-10-CM

## 2022-03-25 DIAGNOSIS — R5383 Other fatigue: Secondary | ICD-10-CM | POA: Diagnosis not present

## 2022-03-25 DIAGNOSIS — E11649 Type 2 diabetes mellitus with hypoglycemia without coma: Secondary | ICD-10-CM | POA: Diagnosis not present

## 2022-03-25 DIAGNOSIS — Z716 Tobacco abuse counseling: Secondary | ICD-10-CM

## 2022-03-25 LAB — POCT GLYCOSYLATED HEMOGLOBIN (HGB A1C): HbA1c, POC (controlled diabetic range): 8 % — AB (ref 0.0–7.0)

## 2022-03-25 MED ORDER — SILDENAFIL CITRATE 100 MG PO TABS: 50.0000 mg | ORAL_TABLET | Freq: Every day | ORAL | 2 refills | Status: DC | PRN

## 2022-03-25 MED ORDER — VARENICLINE TARTRATE 0.5 MG PO TABS: 0.5000 mg | ORAL_TABLET | Freq: Two times a day (BID) | ORAL | 1 refills | Status: DC

## 2022-03-25 MED ORDER — EMPAGLIFLOZIN 25 MG PO TABS: 25.0000 mg | ORAL_TABLET | Freq: Every day | ORAL | 0 refills | Status: DC

## 2022-03-25 MED ORDER — LISINOPRIL 40 MG PO TABS: 40.0000 mg | ORAL_TABLET | Freq: Every day | ORAL | 0 refills | Status: DC

## 2022-03-25 MED ORDER — ATORVASTATIN CALCIUM 40 MG PO TABS: 40.0000 mg | ORAL_TABLET | Freq: Every day | ORAL | 0 refills | Status: DC

## 2022-03-25 MED ORDER — VICTOZA 18 MG/3ML ~~LOC~~ SOPN: PEN_INJECTOR | SUBCUTANEOUS | 0 refills | Status: DC

## 2022-03-25 MED ORDER — ONDANSETRON HCL 8 MG PO TABS: 8.0000 mg | ORAL_TABLET | Freq: Three times a day (TID) | ORAL | 0 refills | Status: DC | PRN

## 2022-03-25 MED ORDER — METFORMIN HCL 1000 MG PO TABS: 1000.0000 mg | ORAL_TABLET | Freq: Two times a day (BID) | ORAL | 0 refills | Status: DC

## 2022-03-25 MED ORDER — HYDROCHLOROTHIAZIDE 25 MG PO TABS: 25.0000 mg | ORAL_TABLET | Freq: Every day | ORAL | 0 refills | Status: DC

## 2022-03-25 NOTE — Patient Instructions (Signed)
It was great to see you! Thank you for allowing me to participate in your care!   Our plans for today:  -I sent in a prescription to Chantix and Zofran to your pharmacy along with your other medications -I have sent in an order for prosthetic liners and socks -I recommend you stop drinking energy drinks, limit to 1/day when you must have one.  We are checking some labs today, I will call you if they are abnormal will send you a MyChart message or a letter if they are normal.  If you do not hear about your labs in the next 2 weeks please let us know.  Take care and seek immediate care sooner if you develop any concerns.   Dr. Erick Alley, DO [facility]

## 2022-03-25 NOTE — Assessment & Plan Note (Signed)
Chantix represcribed.  We will further discuss tobacco cessation at next visit.

## 2022-03-25 NOTE — Assessment & Plan Note (Signed)
A1c of 8 today, goal of 7 or less.  Patient prefers to follow diabetic diet over the next 3 months and recheck.  -Continue current medications

## 2022-03-25 NOTE — Assessment & Plan Note (Signed)
Repeat BMP improved.  -Continue current regimen

## 2022-03-26 LAB — COMPREHENSIVE METABOLIC PANEL
ALT: 41 IU/L (ref 0–44)
AST: 27 IU/L (ref 0–40)
Albumin/Globulin Ratio: 1.9 (ref 1.2–2.2)
Albumin: 4.8 g/dL (ref 3.8–4.9)
Alkaline Phosphatase: 52 IU/L (ref 44–121)
BUN/Creatinine Ratio: 26 — ABNORMAL HIGH (ref 9–20)
BUN: 21 mg/dL (ref 6–24)
Bilirubin Total: 0.3 mg/dL (ref 0.0–1.2)
CO2: 23 mmol/L (ref 20–29)
Calcium: 10.8 mg/dL — ABNORMAL HIGH (ref 8.7–10.2)
Chloride: 99 mmol/L (ref 96–106)
Creatinine, Ser: 0.82 mg/dL (ref 0.76–1.27)
Globulin, Total: 2.5 g/dL (ref 1.5–4.5)
Glucose: 145 mg/dL — ABNORMAL HIGH (ref 70–99)
Potassium: 4.1 mmol/L (ref 3.5–5.2)
Sodium: 142 mmol/L (ref 134–144)
Total Protein: 7.3 g/dL (ref 6.0–8.5)
eGFR: 106 mL/min/{1.73_m2} (ref >59)

## 2022-03-26 LAB — TSH: TSH: 0.707 u[IU]/mL (ref 0.450–4.500)

## 2022-03-26 LAB — CBC
Hematocrit: 48.1 % (ref 37.5–51.0)
Hemoglobin: 17.2 g/dL (ref 13.0–17.7)
MCH: 31.3 pg (ref 26.6–33.0)
MCHC: 35.8 g/dL — ABNORMAL HIGH (ref 31.5–35.7)
MCV: 88 fL (ref 79–97)
Platelets: 308 10*3/uL (ref 150–450)
RBC: 5.5 x10E6/uL (ref 4.14–5.80)
RDW: 14 % (ref 11.6–15.4)
WBC: 8.9 10*3/uL (ref 3.4–10.8)

## 2022-04-12 DIAGNOSIS — G546 Phantom limb syndrome with pain: Secondary | ICD-10-CM | POA: Diagnosis not present

## 2022-05-03 ENCOUNTER — Telehealth: Payer: Self-pay | Admitting: *Deleted

## 2022-05-03 NOTE — Chronic Care Management (AMB) (Signed)
  Care Coordination  Note  05/03/2022 Name: [name] [name] MRN: [id number] DOB: [birth year]  [name] [name] is a 51 y.o. year old male who is a primary care patient of Erick Alley, DO. I reached out to CLENTON ESPER by phone today to offer care coordination services.       Follow up plan: Unsuccessful telephone outreach attempt made. A HIPAA compliant phone message was left for the patient providing contact information and requesting a return call.  The care guide will reach out to the patient again over the next 7 days.  If patient calls provider office to request assistance with care coordination needs, please contact the care guide at the number below.   [facility]  Care Coordination Care Guide  Direct Dial: [phone]

## 2022-05-09 ENCOUNTER — Other Ambulatory Visit: Payer: Self-pay | Admitting: Student

## 2022-05-09 DIAGNOSIS — E11649 Type 2 diabetes mellitus with hypoglycemia without coma: Secondary | ICD-10-CM

## 2022-05-11 DIAGNOSIS — M792 Neuralgia and neuritis, unspecified: Secondary | ICD-10-CM | POA: Diagnosis not present

## 2022-05-12 NOTE — Chronic Care Management (AMB) (Signed)
  Care Coordination  Outreach Note  05/12/2022 Name: [name] [name] MRN: [id number] DOB: 1971-03-21   Care Coordination Outreach Attempts  A second unsuccessful outreach was attempted today to offer the patient with information about available care coordination services as a benefit of their health plan.     Follow Up Plan:  Additional outreach attempts will be made to offer the patient care coordination information and services.   Encounter Outcome:  No Answer  Gwenevere Ghazi  Care Coordination Care Guide  Direct Dial: [phone]

## 2022-05-14 ENCOUNTER — Other Ambulatory Visit: Payer: Self-pay | Admitting: *Deleted

## 2022-05-14 NOTE — Patient Outreach (Signed)
  Care Coordination   05/14/2022 Name: TAMAR LIPSCOMB MRN: [id number] DOB: [birth year]   Care Coordination Outreach Attempts:  An unsuccessful telephone outreach was attempted today to offer the patient information about available care coordination services as a benefit of their health plan.   Follow Up Plan:  No further outreach attempts will be made at this time. We have been unable to contact the patient to offer or enroll patient in care coordination services  Encounter Outcome:  No Answer  Care Coordination Interventions Activated:  No   Care Coordination Interventions:  No, not indicated   Blanchie Serve RN, BSN [facility] Care Management [facility] [phone] [email or web address]

## 2022-05-24 NOTE — Chronic Care Management (AMB) (Signed)
  Care Coordination  Outreach Note  05/24/2022 Name: [name] [name] MRN: [id number] DOB: [birth year]   Care Coordination Outreach Attempts  A third unsuccessful outreach was attempted today to offer the patient with information about available care coordination services as a benefit of their health plan.   Follow Up Plan:  No further outreach attempts will be made at this time. We have been unable to contact the patient to offer or enroll patient in care coordination services  Encounter Outcome:  No Answer   Gwenevere Ghazi  Care Coordination Care Guide  Direct Dial: [phone]

## 2022-06-07 DIAGNOSIS — Z89512 Acquired absence of left leg below knee: Secondary | ICD-10-CM | POA: Diagnosis not present

## 2022-06-09 DIAGNOSIS — M792 Neuralgia and neuritis, unspecified: Secondary | ICD-10-CM | POA: Diagnosis not present

## 2022-06-25 ENCOUNTER — Other Ambulatory Visit: Payer: Self-pay | Admitting: Student

## 2022-06-25 DIAGNOSIS — E119 Type 2 diabetes mellitus without complications: Secondary | ICD-10-CM

## 2022-07-08 DIAGNOSIS — M792 Neuralgia and neuritis, unspecified: Secondary | ICD-10-CM | POA: Diagnosis not present

## 2022-08-05 ENCOUNTER — Other Ambulatory Visit: Payer: Self-pay | Admitting: Student

## 2022-08-05 DIAGNOSIS — M792 Neuralgia and neuritis, unspecified: Secondary | ICD-10-CM | POA: Diagnosis not present

## 2022-08-05 DIAGNOSIS — E119 Type 2 diabetes mellitus without complications: Secondary | ICD-10-CM

## 2022-08-28 ENCOUNTER — Other Ambulatory Visit: Payer: Self-pay | Admitting: Student

## 2022-08-28 DIAGNOSIS — E11649 Type 2 diabetes mellitus with hypoglycemia without coma: Secondary | ICD-10-CM

## 2022-09-03 ENCOUNTER — Other Ambulatory Visit: Payer: Self-pay | Admitting: Student

## 2022-09-03 DIAGNOSIS — E119 Type 2 diabetes mellitus without complications: Secondary | ICD-10-CM

## 2022-09-03 DIAGNOSIS — E11649 Type 2 diabetes mellitus with hypoglycemia without coma: Secondary | ICD-10-CM

## 2022-09-03 DIAGNOSIS — E785 Hyperlipidemia, unspecified: Secondary | ICD-10-CM

## 2022-09-08 DIAGNOSIS — M792 Neuralgia and neuritis, unspecified: Secondary | ICD-10-CM | POA: Diagnosis not present

## 2022-09-14 ENCOUNTER — Ambulatory Visit (INDEPENDENT_AMBULATORY_CARE_PROVIDER_SITE_OTHER): Payer: Federal, State, Local not specified - PPO | Admitting: Podiatry

## 2022-09-14 DIAGNOSIS — E1140 Type 2 diabetes mellitus with diabetic neuropathy, unspecified: Secondary | ICD-10-CM | POA: Diagnosis not present

## 2022-09-14 DIAGNOSIS — L84 Corns and callosities: Secondary | ICD-10-CM | POA: Diagnosis not present

## 2022-09-14 DIAGNOSIS — E119 Type 2 diabetes mellitus without complications: Secondary | ICD-10-CM

## 2022-09-14 DIAGNOSIS — T148XXA Other injury of unspecified body region, initial encounter: Secondary | ICD-10-CM | POA: Diagnosis not present

## 2022-09-14 NOTE — Patient Instructions (Signed)
Look for urea 40% with salicylic acid cream or ointment and apply to the thickened dry skin / calluses. This can be bought over the counter, at a pharmacy or online such as [employer].  

## 2022-09-17 NOTE — Progress Notes (Signed)
  Subjective:  Patient ID: [name] [name], male    DOB: 17-Apr-1971,  MRN: [id number]  Chief Complaint  Patient presents with   Foot Problem    Patient has a dark lesion on the top right great hallux, patient noticed the area last Saturday, no drainage or pain    51 y.o. male presents with the above complaint. History confirmed with patient.  He was concerned due to his diabetes  Objective:  Physical Exam: warm, good capillary refill, no trophic changes or ulcerative lesions, normal DP and PT pulses, normal sensory exam, and blood blister distal right hallux, some hyperkeratosis here  Assessment:   1. Blood blister   2. Callus of foot   3. Type 2 diabetes mellitus with diabetic neuropathy, unspecified whether long term insulin use (HCC)      Plan:  Patient was evaluated and treated and all questions answered.  Patient educated on diabetes. Discussed proper diabetic foot care and discussed risks and complications of disease. Educated patient in depth on reasons to return to the office immediately should he/she discover anything concerning or new on the feet. All questions answered. Discussed proper shoes as well.    We discussed that he has a blood blister on the tip of the hallux secondary to a callus here.  We discussed appropriate shoe gear and tightness of shoes.  I recommended offloading with a silicone toe cap and dispensed this today.  The lesion was debrided, there is nothing concerning that required biopsy.  He will continue to monitor this.  Also recommend using urea cream for the calluses.  Return in about 3 months (around 12/14/2022) for diabetic foot check .

## 2022-10-26 DIAGNOSIS — M792 Neuralgia and neuritis, unspecified: Secondary | ICD-10-CM | POA: Diagnosis not present

## 2022-10-26 DIAGNOSIS — Z79891 Long term (current) use of opiate analgesic: Secondary | ICD-10-CM | POA: Diagnosis not present

## 2022-10-29 ENCOUNTER — Other Ambulatory Visit: Payer: Self-pay | Admitting: Student

## 2022-10-29 DIAGNOSIS — E119 Type 2 diabetes mellitus without complications: Secondary | ICD-10-CM

## 2022-11-02 ENCOUNTER — Telehealth: Payer: Self-pay

## 2022-11-02 NOTE — Patient Outreach (Signed)
  Care Coordination   11/02/2022 Name: ARTHA CHIASSON MRN: [id number] DOB: [birth year]   Care Coordination Outreach Attempts:  An unsuccessful telephone outreach was attempted today to offer the patient information about available care coordination services as a benefit of their health plan.   Follow Up Plan:  Additional outreach attempts will be made to offer the patient care coordination information and services.   Encounter Outcome:  No Answer   Care Coordination Interventions:  No, not indicated    Jone Baseman, RN, MSN [facility] [facility] Care Management Coordinator Direct Line [phone]

## 2022-11-12 ENCOUNTER — Telehealth: Payer: Self-pay

## 2022-11-12 NOTE — Patient Outreach (Signed)
  Care Coordination   11/12/2022 Name: [name] [name] MRN: [id number] DOB: 1970-12-22   Care Coordination Outreach Attempts:  A second unsuccessful outreach was attempted today to offer the patient with information about available care coordination services as a benefit of their health plan.     Follow Up Plan:  Additional outreach attempts will be made to offer the patient care coordination information and services.   Encounter Outcome:  Pt. Request to Call Back   Care Coordination Interventions:  No, not indicated    Jone Baseman, RN, MSN [place] [facility] Care Management Coordinator Direct Line [phone]

## 2022-11-22 NOTE — Progress Notes (Signed)
    SUBJECTIVE:   CHIEF COMPLAINT / HPI:   T2DM Hemoglobin A1c of 8 on 03/25/2022.  Currently takes Jardiance 25 mg daily, metformin 1000 mg twice daily, Victoza 1.8 mg daily and is on a statin.  HTN Regimen includes lisinopril 40 mg daily and HCTZ 25 mg daily, BPs at home have been running***  PERTINENT  PMH / PSH: ***  OBJECTIVE:   There were no vitals taken for this visit. ***  General: NAD, pleasant, able to participate in exam Cardiac: RRR, no murmurs. Respiratory: CTAB, normal effort, No wheezes, rales or rhonchi Abdomen: Bowel sounds present, nontender, nondistended, no hepatosplenomegaly. Extremities: no edema or cyanosis. Skin: warm and dry, no rashes noted Neuro: alert, no obvious focal deficits Psych: Normal affect and mood  ASSESSMENT/PLAN:   No problem-specific Assessment & Plan notes found for this encounter.     Dr. Precious Gilding, [place]    {    This will disappear when note is signed, click to select method of visit    :1}

## 2022-11-23 ENCOUNTER — Ambulatory Visit: Payer: Federal, State, Local not specified - PPO | Admitting: Student

## 2022-11-23 ENCOUNTER — Encounter: Payer: Self-pay | Admitting: Student

## 2022-11-23 VITALS — BP 140/70 | HR 96 | Ht 73.0 in | Wt 207.4 lb

## 2022-11-23 DIAGNOSIS — K861 Other chronic pancreatitis: Secondary | ICD-10-CM

## 2022-11-23 DIAGNOSIS — E11649 Type 2 diabetes mellitus with hypoglycemia without coma: Secondary | ICD-10-CM | POA: Diagnosis not present

## 2022-11-23 DIAGNOSIS — I10 Essential (primary) hypertension: Secondary | ICD-10-CM | POA: Diagnosis not present

## 2022-11-23 DIAGNOSIS — E785 Hyperlipidemia, unspecified: Secondary | ICD-10-CM

## 2022-11-23 DIAGNOSIS — E1140 Type 2 diabetes mellitus with diabetic neuropathy, unspecified: Secondary | ICD-10-CM | POA: Diagnosis not present

## 2022-11-23 LAB — POCT GLYCOSYLATED HEMOGLOBIN (HGB A1C): HbA1c, POC (controlled diabetic range): 7.2 % — AB (ref 0.0–7.0)

## 2022-11-23 MED ORDER — LISINOPRIL 40 MG PO TABS: ORAL_TABLET | ORAL | 3 refills | Status: DC

## 2022-11-23 MED ORDER — ONDANSETRON HCL 8 MG PO TABS: 8.0000 mg | ORAL_TABLET | Freq: Three times a day (TID) | ORAL | 0 refills | Status: DC | PRN

## 2022-11-23 MED ORDER — ATORVASTATIN CALCIUM 40 MG PO TABS: 40.0000 mg | ORAL_TABLET | Freq: Every day | ORAL | 3 refills | Status: DC

## 2022-11-23 MED ORDER — HYDROCHLOROTHIAZIDE 25 MG PO TABS: 25.0000 mg | ORAL_TABLET | Freq: Every day | ORAL | 3 refills | Status: DC

## 2022-11-23 MED ORDER — SEMAGLUTIDE(0.25 OR 0.5MG/DOS) 2 MG/1.5ML ~~LOC~~ SOPN: 0.2500 mg | PEN_INJECTOR | SUBCUTANEOUS | 0 refills | Status: DC

## 2022-11-23 NOTE — Assessment & Plan Note (Signed)
-  Continue atorvastatin -Lipid panel when BMP is checked

## 2022-11-23 NOTE — Assessment & Plan Note (Signed)
-  Lisinopril reordered -Continue HCTZ -Lab only visit for BMP in 1 to 2 weeks

## 2022-11-23 NOTE — Patient Instructions (Signed)
It was great to see you! Thank you for allowing me to participate in your care!  I recommend that you always bring your medications to each appointment as this makes it easy to ensure you are on the correct medications and helps [place] not miss when refills are needed.  Our plans for today:  - Ozempic was sent to pharmacy. Start with 0.83m weekly dose, after 4 weeks if you tolerate it well, increase to 0.5 mg weekly.  - Return in 1-2 weeks for lab only visit to check your kidney function, electrolytes and lipid panel  -Return in 4 months for diabetes follow up   Take care and seek immediate care sooner if you develop any concerns.   Dr. SPrecious Gilding DO [facility]

## 2022-11-23 NOTE — Assessment & Plan Note (Signed)
Risks and benefits of Ozempic discussed.  No family history of thyroid cancer.  -Ozempic 0.25 mg injected weekly, can increase to 0.5 mg in 4 weeks if tolerating well.  Will continue to titrate up as tolerated. -Return in 4 months for A1c

## 2022-11-25 ENCOUNTER — Other Ambulatory Visit: Payer: Self-pay | Admitting: Student

## 2022-11-25 DIAGNOSIS — M792 Neuralgia and neuritis, unspecified: Secondary | ICD-10-CM | POA: Diagnosis not present

## 2022-11-26 ENCOUNTER — Other Ambulatory Visit: Payer: Self-pay | Admitting: Student

## 2022-11-26 DIAGNOSIS — Z89512 Acquired absence of left leg below knee: Secondary | ICD-10-CM

## 2022-11-26 NOTE — Telephone Encounter (Signed)
Faxed order to [facility].   Talbot Grumbling, RN

## 2022-11-26 NOTE — Progress Notes (Unsigned)
Pt in need of new socks and liners for prosthetic. Order placed.

## 2022-12-06 ENCOUNTER — Ambulatory Visit (INDEPENDENT_AMBULATORY_CARE_PROVIDER_SITE_OTHER): Payer: Federal, State, Local not specified - PPO | Admitting: Student

## 2022-12-06 DIAGNOSIS — E785 Hyperlipidemia, unspecified: Secondary | ICD-10-CM

## 2022-12-06 DIAGNOSIS — I10 Essential (primary) hypertension: Secondary | ICD-10-CM

## 2022-12-06 NOTE — Progress Notes (Signed)
Pt presented for lab only visit. BMP and lipid panel collected.

## 2022-12-07 LAB — LIPID PANEL
Chol/HDL Ratio: 4.2 ratio (ref 0.0–5.0)
Cholesterol, Total: 135 mg/dL (ref 100–199)
HDL: 32 mg/dL — ABNORMAL LOW (ref >39)
LDL Chol Calc (NIH): 47 mg/dL (ref 0–99)
Triglycerides: 377 mg/dL — ABNORMAL HIGH (ref 0–149)
VLDL Cholesterol Cal: 56 mg/dL — ABNORMAL HIGH (ref 5–40)

## 2022-12-07 LAB — BASIC METABOLIC PANEL
BUN/Creatinine Ratio: 24 — ABNORMAL HIGH (ref 9–20)
BUN: 20 mg/dL (ref 6–24)
CO2: 19 mmol/L — ABNORMAL LOW (ref 20–29)
Calcium: 10.3 mg/dL — ABNORMAL HIGH (ref 8.7–10.2)
Chloride: 102 mmol/L (ref 96–106)
Creatinine, Ser: 0.84 mg/dL (ref 0.76–1.27)
Glucose: 124 mg/dL — ABNORMAL HIGH (ref 70–99)
Potassium: 4.6 mmol/L (ref 3.5–5.2)
Sodium: 142 mmol/L (ref 134–144)
eGFR: 104 mL/min/{1.73_m2} (ref >59)

## 2022-12-23 DIAGNOSIS — M792 Neuralgia and neuritis, unspecified: Secondary | ICD-10-CM | POA: Diagnosis not present

## 2022-12-23 DIAGNOSIS — Z79891 Long term (current) use of opiate analgesic: Secondary | ICD-10-CM | POA: Diagnosis not present

## 2022-12-28 ENCOUNTER — Other Ambulatory Visit: Payer: Self-pay | Admitting: Student

## 2022-12-28 DIAGNOSIS — E11649 Type 2 diabetes mellitus with hypoglycemia without coma: Secondary | ICD-10-CM

## 2023-01-03 ENCOUNTER — Other Ambulatory Visit: Payer: Self-pay | Admitting: Student

## 2023-01-03 ENCOUNTER — Encounter: Payer: Self-pay | Admitting: Student

## 2023-01-03 DIAGNOSIS — E1140 Type 2 diabetes mellitus with diabetic neuropathy, unspecified: Secondary | ICD-10-CM

## 2023-01-04 ENCOUNTER — Encounter: Payer: Self-pay | Admitting: Student

## 2023-01-04 ENCOUNTER — Other Ambulatory Visit: Payer: Self-pay | Admitting: Student

## 2023-01-04 DIAGNOSIS — E1140 Type 2 diabetes mellitus with diabetic neuropathy, unspecified: Secondary | ICD-10-CM

## 2023-01-04 MED ORDER — SEMAGLUTIDE (1 MG/DOSE) 4 MG/3ML ~~LOC~~ SOPN: 1.0000 mg | PEN_INJECTOR | SUBCUTANEOUS | 0 refills | Status: DC

## 2023-01-04 NOTE — Progress Notes (Signed)
Patient has been doing well on Ozempic 0.5 mg.  Refill ordered at 1 mg per dose

## 2023-01-20 ENCOUNTER — Telehealth: Payer: Self-pay | Admitting: Student

## 2023-01-20 DIAGNOSIS — M792 Neuralgia and neuritis, unspecified: Secondary | ICD-10-CM | POA: Diagnosis not present

## 2023-01-20 NOTE — Telephone Encounter (Signed)
[facility] called checking on the status of order form. It was sent on 03/22, and 03/28. I could not find it in doctors box or in already faxed. I did request her to fax it to [place] again.   Please Advise.   Thanks!

## 2023-01-21 DIAGNOSIS — Z89512 Acquired absence of left leg below knee: Secondary | ICD-10-CM | POA: Diagnosis not present

## 2023-01-24 ENCOUNTER — Other Ambulatory Visit: Payer: Self-pay

## 2023-01-24 ENCOUNTER — Emergency Department (HOSPITAL_BASED_OUTPATIENT_CLINIC_OR_DEPARTMENT_OTHER)
Admission: EM | Admit: 2023-01-24 | Discharge: 2023-01-24 | Disposition: A | Discharge status: Home / Self Care | Payer: Federal, State, Local not specified - PPO | Attending: Emergency Medicine | Admitting: Emergency Medicine

## 2023-01-24 DIAGNOSIS — Z794 Long term (current) use of insulin: Secondary | ICD-10-CM | POA: Diagnosis not present

## 2023-01-24 DIAGNOSIS — E119 Type 2 diabetes mellitus without complications: Secondary | ICD-10-CM | POA: Insufficient documentation

## 2023-01-24 DIAGNOSIS — Z79899 Other long term (current) drug therapy: Secondary | ICD-10-CM | POA: Diagnosis not present

## 2023-01-24 DIAGNOSIS — L02416 Cutaneous abscess of left lower limb: Secondary | ICD-10-CM | POA: Insufficient documentation

## 2023-01-24 DIAGNOSIS — L0291 Cutaneous abscess, unspecified: Secondary | ICD-10-CM

## 2023-01-24 DIAGNOSIS — Z7984 Long term (current) use of oral hypoglycemic drugs: Secondary | ICD-10-CM | POA: Insufficient documentation

## 2023-01-24 DIAGNOSIS — I10 Essential (primary) hypertension: Secondary | ICD-10-CM | POA: Diagnosis not present

## 2023-01-24 DIAGNOSIS — L02415 Cutaneous abscess of right lower limb: Secondary | ICD-10-CM | POA: Diagnosis not present

## 2023-01-24 LAB — CBC WITH DIFFERENTIAL/PLATELET
Abs Immature Granulocytes: 0.03 10*3/uL (ref 0.00–0.07)
Basophils Absolute: 0.1 10*3/uL (ref 0.0–0.1)
Basophils Relative: 1 %
Eosinophils Absolute: 0.3 10*3/uL (ref 0.0–0.5)
Eosinophils Relative: 3 %
HCT: 44.2 % (ref 39.0–52.0)
Hemoglobin: 15.6 g/dL (ref 13.0–17.0)
Immature Granulocytes: 0 %
Lymphocytes Relative: 20 %
Lymphs Abs: 2 10*3/uL (ref 0.7–4.0)
MCH: 31.6 pg (ref 26.0–34.0)
MCHC: 35.3 g/dL (ref 30.0–36.0)
MCV: 89.5 fL (ref 80.0–100.0)
Monocytes Absolute: 0.6 10*3/uL (ref 0.1–1.0)
Monocytes Relative: 6 %
Neutro Abs: 7.5 10*3/uL (ref 1.7–7.7)
Neutrophils Relative %: 70 %
Platelets: 214 10*3/uL (ref 150–400)
RBC: 4.94 MIL/uL (ref 4.22–5.81)
RDW: 14.5 % (ref 11.5–15.5)
WBC: 10.5 10*3/uL (ref 4.0–10.5)
nRBC: 0 % (ref 0.0–0.2)

## 2023-01-24 LAB — COMPREHENSIVE METABOLIC PANEL
ALT: 19 U/L (ref 0–44)
AST: 12 U/L — ABNORMAL LOW (ref 15–41)
Albumin: 4.4 g/dL (ref 3.5–5.0)
Alkaline Phosphatase: 40 U/L (ref 38–126)
Anion gap: 15 (ref 5–15)
BUN: 19 mg/dL (ref 6–20)
CO2: 21 mmol/L — ABNORMAL LOW (ref 22–32)
Calcium: 10 mg/dL (ref 8.9–10.3)
Chloride: 103 mmol/L (ref 98–111)
Creatinine, Ser: 0.65 mg/dL (ref 0.61–1.24)
GFR, Estimated: >60 mL/min (ref >60)
Glucose, Bld: 181 mg/dL — ABNORMAL HIGH (ref 70–99)
Potassium: 3.4 mmol/L — ABNORMAL LOW (ref 3.5–5.1)
Sodium: 139 mmol/L (ref 135–145)
Total Bilirubin: 0.4 mg/dL (ref 0.3–1.2)
Total Protein: 6.8 g/dL (ref 6.5–8.1)

## 2023-01-24 LAB — LACTIC ACID, PLASMA
Lactic Acid, Venous: 2.8 mmol/L (ref 0.5–1.9)
Lactic Acid, Venous: 2.2 mmol/L (ref 0.5–1.9)

## 2023-01-24 MED ORDER — SULFAMETHOXAZOLE-TRIMETHOPRIM 800-160 MG PO TABS: 1.0000 | ORAL_TABLET | Freq: Two times a day (BID) | ORAL | 0 refills | Status: AC

## 2023-01-24 MED ORDER — LIDOCAINE-EPINEPHRINE (PF) 2 %-1:200000 IJ SOLN
20.0000 mL | Freq: Once | INTRAMUSCULAR | Status: AC
  Administered 2023-01-24: 20 mL
  Filled 2023-01-24: qty 20

## 2023-01-24 NOTE — ED Triage Notes (Addendum)
Patient arrives with complaints of posterior left leg abscess x3 days. Site has become increasingly inflamed and painful, per patient.   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

## 2023-01-24 NOTE — ED Provider Notes (Signed)
[place] EMERGENCY DEPARTMENT AT [facility] Provider Note   CSN: [id number] Arrival date & time: 01/24/23  1406     History  Chief Complaint  Patient presents with   Abscess    Left leg    [name] [name] is a 52 y.o. male.   Abscess    Patient is a 52 year old with medical history of hypertension, pancreatitis, BKA left lower extremity, diabetes presenting to the emergency department due to left lower extremity abscess.  It is posterior left lower extremity near the BKA.  It started 2 days ago as a small bump and has been increasing in size and is painful.  No fevers or chills, is up-to-date on his tetanus.  Does have a history of MRSA.  Home Medications Prior to Admission medications   Medication Sig Start Date End Date Taking? Authorizing Provider  sulfamethoxazole-trimethoprim (BACTRIM DS) 800-160 MG tablet Take 1 tablet by mouth 2 (two) times daily for 7 days. 01/24/23 01/31/23 Yes Theron Arista, PA-C  atorvastatin (LIPITOR) 40 MG tablet Take 1 tablet (40 mg total) by mouth daily. 11/23/22   Erick Alley, DO  doxycycline (VIBRAMYCIN) 100 MG capsule Take 1 capsule (100 mg total) by mouth 2 (two) times daily. 08/25/21   Pollyann Savoy, MD  hydrochlorothiazide (HYDRODIURIL) 25 MG tablet Take 1 tablet (25 mg total) by mouth daily. 11/23/22   Erick Alley, DO  JARDIANCE 25 MG TABS tablet Take 1 tablet by mouth once daily 12/28/22   Erick Alley, DO  lisinopril (ZESTRIL) 40 MG tablet TAKE 1 TABLET BY MOUTH ONCE DAILY. PLEASE CALL TO SCHEDULE AN APPOINTMENT BEFORE YOUR NEXT REFILL. 11/23/22   Erick Alley, DO  metFORMIN (GLUCOPHAGE) 1000 MG tablet TAKE 1 TABLET BY MOUTH TWICE DAILY WITH A MEAL 10/29/22   Erick Alley, DO  ondansetron (ZOFRAN) 8 MG tablet Take 1 tablet (8 mg total) by mouth every 8 (eight) hours as needed for nausea or vomiting. 11/23/22   Erick Alley, DO  Semaglutide, 1 MG/DOSE, 4 MG/3ML SOPN Inject 1 mg into the skin once a week. 01/04/23   Erick Alley, DO   sildenafil (VIAGRA) 100 MG tablet Take 0.5-1 tablets (50-100 mg total) by mouth daily as needed for erectile dysfunction. 03/25/22   Erick Alley, DO  varenicline (CHANTIX) 0.5 MG tablet Take 1 tablet (0.5 mg total) by mouth 2 (two) times daily. 03/25/22   Erick Alley, DO      Allergies    Patient has no known allergies.    Review of Systems   Review of Systems  Physical Exam Updated Vital Signs BP (!) 140/92 (BP Location: Right Arm)   Pulse 93   Temp 98.5 F (36.9 C) (Oral)   Resp 17   SpO2 99%  Physical Exam Vitals and nursing note reviewed. Exam conducted with a chaperone present.  Constitutional:      Appearance: Normal appearance.  HENT:     Head: Normocephalic and atraumatic.  Eyes:     General: No scleral icterus.       Right eye: No discharge.        Left eye: No discharge.     Extraocular Movements: Extraocular movements intact.     Pupils: Pupils are equal, round, and reactive to light.  Cardiovascular:     Rate and Rhythm: Normal rate and regular rhythm.     Pulses: Normal pulses.     Heart sounds: Normal heart sounds.     No friction rub. No gallop.  Pulmonary:  Effort: Pulmonary effort is normal. No respiratory distress.     Breath sounds: Normal breath sounds.  Abdominal:     General: Abdomen is flat. Bowel sounds are normal. There is no distension.     Palpations: Abdomen is soft.     Tenderness: There is no abdominal tenderness.  Skin:    General: Skin is warm and dry.     Coloration: Skin is not jaundiced.  Neurological:     Mental Status: He is alert. Mental status is at baseline.     Coordination: Coordination normal.     ED Results / Procedures / Treatments   Labs (all labs ordered are listed, but only abnormal results are displayed) Labs Reviewed  COMPREHENSIVE METABOLIC PANEL - Abnormal; Notable for the following components:      Result Value   Potassium 3.4 (*)    CO2 21 (*)    Glucose, Bld 181 (*)    AST 12 (*)    All other  components within normal limits  LACTIC ACID, PLASMA - Abnormal; Notable for the following components:   Lactic Acid, Venous 2.8 (*)    All other components within normal limits  LACTIC ACID, PLASMA - Abnormal; Notable for the following components:   Lactic Acid, Venous 2.2 (*)    All other components within normal limits  CULTURE, BLOOD (ROUTINE X 2)  CULTURE, BLOOD (ROUTINE X 2)  CBC WITH DIFFERENTIAL/PLATELET    EKG None  Radiology No results found.  Procedures .Marland KitchenIncision and Drainage  Date/Time: 01/24/2023 5:31 PM  Performed by: Sherlyn Lick, Student-PA Authorized by: Theron Arista, PA-C   Consent:    Consent obtained:  Verbal   Consent given by:  Patient   Risks, benefits, and alternatives were discussed: yes     Risks discussed:  Bleeding, incomplete drainage, pain, infection and damage to other organs   Alternatives discussed:  No treatment, delayed treatment and alternative treatment Universal protocol:    Procedure explained and questions answered to patient or proxy's satisfaction: yes     Relevant documents present and verified: yes     Test results available : yes     Imaging studies available: yes     Required blood products, implants, devices, and special equipment available: yes     Site/side marked: yes     Immediately prior to procedure, a time out was called: yes     Patient identity confirmed:  Verbally with patient Location:    Type:  Abscess   Size:  5 cm   Location:  Lower extremity   Lower extremity location:  Leg   Leg location:  L upper leg Pre-procedure details:    Skin preparation:  Povidone-iodine Sedation:    Sedation type:  None Anesthesia:    Anesthesia method:  Local infiltration   Local anesthetic:  Lidocaine 1% WITH epi Procedure type:    Complexity:  Simple Procedure details:    Ultrasound guidance: yes     Needle aspiration: no     Incision types:  Single straight   Drainage:  Purulent   Drainage amount:  Copious    Wound treatment:  Wound left open   Packing materials:  1/4 in iodoform gauze   Amount 1/4" iodoform:  10 Post-procedure details:    Procedure completion:  Tolerated well, no immediate complications     Medications Ordered in ED Medications  lidocaine-EPINEPHrine (XYLOCAINE W/EPI) 2 %-1:200000 (PF) injection 20 mL (20 mLs Infiltration Given 01/24/23 1528)    ED Course/ Medical  Decision Making/ A&P                             Medical Decision Making Amount and/or Complexity of Data Reviewed Labs: ordered.  Risk Prescription drug management.   Patient presents due to abscess to left lower extremity.  Differential includes abscess, cellulitis, sepsis, deeper space infection.  Patient is mildly tachycardic but afebrile, he does have a suspected source of infection so lactic and cultures were obtained.  Seems to be very well localized, point-of-care ultrasound was utilized and popliteal artery is not involved in the fluctuant area of the abscess, will proceed with I&D.  Patient tetanus up-to-date, patient tolerated I&D without any difficulty.  No leukocytosis anemia, no gross electrolyte derangement or AKI.  Patient has a slight lactic acidosis just probably secondary to infection, clinically, lower suspicion for sepsis and suspect localized infection.  Will have him follow-up with his PCP, will discharge with Bactrim for additional MRSA coverage.  Stable for discharge at this time.        Final Clinical Impression(s) / ED Diagnoses Final diagnoses:  Abscess    Rx / DC Orders ED Discharge Orders          Ordered    sulfamethoxazole-trimethoprim (BACTRIM DS) 800-160 MG tablet  2 times daily        01/24/23 1623              Theron Arista, [place] 01/24/23 1747    Alvira Monday, MD 01/24/23 2228

## 2023-01-24 NOTE — ED Notes (Signed)
Discharge instructions, follow up care, pain management and prescriptions reviewed and explained, pt verbalized understanding and had no further questions on d/c. Pt caox4, ambulatory, NAD on d/c.

## 2023-01-24 NOTE — Discharge Instructions (Signed)
You are seen today in the emergency department due to abscess.  This was drained, do warm compresses at home, change the dressing daily and check for worsening signs of infection.  Follow-up with your primary in 3 days for wound check, also you will need to remove the packing in 3 days.  Return to the ED for fevers, worsening symptoms, new or concerning symptoms.

## 2023-01-25 ENCOUNTER — Other Ambulatory Visit: Payer: Self-pay | Admitting: Student

## 2023-01-25 DIAGNOSIS — E1140 Type 2 diabetes mellitus with diabetic neuropathy, unspecified: Secondary | ICD-10-CM

## 2023-01-25 LAB — CULTURE, BLOOD (ROUTINE X 2): Special Requests: ADEQUATE

## 2023-01-25 MED ORDER — OZEMPIC (2 MG/DOSE) 8 MG/3ML ~~LOC~~ SOPN
2.0000 mg | PEN_INJECTOR | SUBCUTANEOUS | 3 refills | Status: DC
Start: 2023-01-25 — End: 2023-05-17

## 2023-01-25 NOTE — Progress Notes (Signed)
Pt reports he has tolerated 1 mg ozempic and would like to increase to 2 mg. This has been sent to the pharmacy.

## 2023-01-26 LAB — CULTURE, BLOOD (ROUTINE X 2)

## 2023-01-27 LAB — CULTURE, BLOOD (ROUTINE X 2): Culture: NO GROWTH

## 2023-01-29 LAB — CULTURE, BLOOD (ROUTINE X 2)
Culture: NO GROWTH
Special Requests: ADEQUATE

## 2023-02-01 ENCOUNTER — Other Ambulatory Visit: Payer: Self-pay | Admitting: Student

## 2023-02-01 DIAGNOSIS — E119 Type 2 diabetes mellitus without complications: Secondary | ICD-10-CM

## 2023-02-17 DIAGNOSIS — M792 Neuralgia and neuritis, unspecified: Secondary | ICD-10-CM | POA: Diagnosis not present

## 2023-03-17 DIAGNOSIS — M792 Neuralgia and neuritis, unspecified: Secondary | ICD-10-CM | POA: Diagnosis not present

## 2023-03-31 ENCOUNTER — Other Ambulatory Visit: Payer: Self-pay | Admitting: Student

## 2023-03-31 DIAGNOSIS — E11649 Type 2 diabetes mellitus with hypoglycemia without coma: Secondary | ICD-10-CM

## 2023-05-05 ENCOUNTER — Other Ambulatory Visit: Payer: Self-pay | Admitting: Student

## 2023-05-05 DIAGNOSIS — M792 Neuralgia and neuritis, unspecified: Secondary | ICD-10-CM | POA: Diagnosis not present

## 2023-05-05 DIAGNOSIS — Z79891 Long term (current) use of opiate analgesic: Secondary | ICD-10-CM | POA: Diagnosis not present

## 2023-05-05 DIAGNOSIS — E119 Type 2 diabetes mellitus without complications: Secondary | ICD-10-CM

## 2023-05-14 ENCOUNTER — Other Ambulatory Visit: Payer: Self-pay | Admitting: Student

## 2023-05-14 DIAGNOSIS — E1140 Type 2 diabetes mellitus with diabetic neuropathy, unspecified: Secondary | ICD-10-CM

## 2023-06-02 DIAGNOSIS — M792 Neuralgia and neuritis, unspecified: Secondary | ICD-10-CM | POA: Diagnosis not present

## 2023-06-14 ENCOUNTER — Other Ambulatory Visit: Payer: Self-pay | Admitting: Student

## 2023-06-14 DIAGNOSIS — E1140 Type 2 diabetes mellitus with diabetic neuropathy, unspecified: Secondary | ICD-10-CM

## 2023-06-30 ENCOUNTER — Other Ambulatory Visit: Payer: Self-pay | Admitting: Student

## 2023-06-30 DIAGNOSIS — M792 Neuralgia and neuritis, unspecified: Secondary | ICD-10-CM | POA: Diagnosis not present

## 2023-06-30 DIAGNOSIS — E11649 Type 2 diabetes mellitus with hypoglycemia without coma: Secondary | ICD-10-CM

## 2023-07-17 ENCOUNTER — Other Ambulatory Visit: Payer: Self-pay | Admitting: Student

## 2023-07-17 DIAGNOSIS — E1140 Type 2 diabetes mellitus with diabetic neuropathy, unspecified: Secondary | ICD-10-CM

## 2023-07-28 DIAGNOSIS — M792 Neuralgia and neuritis, unspecified: Secondary | ICD-10-CM | POA: Diagnosis not present

## 2023-07-28 DIAGNOSIS — Z79891 Long term (current) use of opiate analgesic: Secondary | ICD-10-CM | POA: Diagnosis not present

## 2023-08-16 ENCOUNTER — Other Ambulatory Visit: Payer: Self-pay | Admitting: Student

## 2023-08-16 DIAGNOSIS — E1140 Type 2 diabetes mellitus with diabetic neuropathy, unspecified: Secondary | ICD-10-CM

## 2023-08-25 DIAGNOSIS — Z79891 Long term (current) use of opiate analgesic: Secondary | ICD-10-CM | POA: Diagnosis not present

## 2023-08-25 DIAGNOSIS — M792 Neuralgia and neuritis, unspecified: Secondary | ICD-10-CM | POA: Diagnosis not present

## 2023-09-02 ENCOUNTER — Other Ambulatory Visit: Payer: Self-pay | Admitting: Student

## 2023-09-02 DIAGNOSIS — E119 Type 2 diabetes mellitus without complications: Secondary | ICD-10-CM

## 2023-09-22 ENCOUNTER — Other Ambulatory Visit: Payer: Self-pay | Admitting: Student

## 2023-09-22 DIAGNOSIS — E11649 Type 2 diabetes mellitus with hypoglycemia without coma: Secondary | ICD-10-CM

## 2023-09-25 ENCOUNTER — Other Ambulatory Visit: Payer: Self-pay | Admitting: Student

## 2023-09-25 DIAGNOSIS — E11649 Type 2 diabetes mellitus with hypoglycemia without coma: Secondary | ICD-10-CM

## 2023-09-29 DIAGNOSIS — M792 Neuralgia and neuritis, unspecified: Secondary | ICD-10-CM | POA: Diagnosis not present

## 2023-09-29 DIAGNOSIS — Z79891 Long term (current) use of opiate analgesic: Secondary | ICD-10-CM | POA: Diagnosis not present

## 2023-10-06 ENCOUNTER — Other Ambulatory Visit: Payer: Self-pay | Admitting: Student

## 2023-10-06 DIAGNOSIS — E11649 Type 2 diabetes mellitus with hypoglycemia without coma: Secondary | ICD-10-CM

## 2023-10-07 ENCOUNTER — Other Ambulatory Visit: Payer: Self-pay

## 2023-10-07 DIAGNOSIS — E11649 Type 2 diabetes mellitus with hypoglycemia without coma: Secondary | ICD-10-CM

## 2023-10-12 ENCOUNTER — Encounter: Payer: Self-pay | Admitting: Student

## 2023-10-12 DIAGNOSIS — R262 Difficulty in walking, not elsewhere classified: Secondary | ICD-10-CM

## 2023-10-13 ENCOUNTER — Other Ambulatory Visit (HOSPITAL_COMMUNITY): Payer: Self-pay

## 2023-10-13 ENCOUNTER — Telehealth: Payer: Self-pay

## 2023-10-13 NOTE — Telephone Encounter (Signed)
 Pharmacy Patient Advocate Encounter   Received notification from Patient Advice Request messages that prior authorization for OZEMPIC  2MG  DOSE is required/requested.   Insurance verification completed.   The patient is insured through [facility] .   PA required; PA submitted to above mentioned insurance via CoverMyMeds Key/confirmation #/EOC [id number]. Status is pending

## 2023-10-13 NOTE — Telephone Encounter (Signed)
 Pharmacy Patient Advocate Encounter  Received notification from [facility] that Prior Authorization for [facility] 2MG  has been APPROVED from 09/13/23 to 10/12/24

## 2023-10-13 NOTE — Telephone Encounter (Signed)
 Faxed DME order to [facility].   Veronda Prude, RN

## 2023-11-04 ENCOUNTER — Other Ambulatory Visit: Payer: Self-pay | Admitting: Student

## 2023-11-04 DIAGNOSIS — E11649 Type 2 diabetes mellitus with hypoglycemia without coma: Secondary | ICD-10-CM

## 2023-11-04 DIAGNOSIS — E785 Hyperlipidemia, unspecified: Secondary | ICD-10-CM

## 2023-12-03 ENCOUNTER — Other Ambulatory Visit: Payer: Self-pay | Admitting: Student

## 2023-12-03 DIAGNOSIS — E119 Type 2 diabetes mellitus without complications: Secondary | ICD-10-CM

## 2023-12-08 ENCOUNTER — Other Ambulatory Visit: Payer: Self-pay | Admitting: Student

## 2023-12-08 DIAGNOSIS — E785 Hyperlipidemia, unspecified: Secondary | ICD-10-CM

## 2023-12-09 ENCOUNTER — Other Ambulatory Visit: Payer: Self-pay | Admitting: Student

## 2023-12-09 DIAGNOSIS — E11649 Type 2 diabetes mellitus with hypoglycemia without coma: Secondary | ICD-10-CM

## 2023-12-20 ENCOUNTER — Other Ambulatory Visit: Payer: Self-pay | Admitting: Student

## 2023-12-20 DIAGNOSIS — E1140 Type 2 diabetes mellitus with diabetic neuropathy, unspecified: Secondary | ICD-10-CM

## 2023-12-22 ENCOUNTER — Other Ambulatory Visit: Payer: Self-pay | Admitting: Student

## 2023-12-22 DIAGNOSIS — E11649 Type 2 diabetes mellitus with hypoglycemia without coma: Secondary | ICD-10-CM

## 2023-12-23 ENCOUNTER — Other Ambulatory Visit: Payer: Self-pay | Admitting: Student

## 2023-12-23 DIAGNOSIS — E11649 Type 2 diabetes mellitus with hypoglycemia without coma: Secondary | ICD-10-CM

## 2023-12-27 ENCOUNTER — Other Ambulatory Visit: Payer: Self-pay | Admitting: Nurse Practitioner

## 2023-12-27 DIAGNOSIS — M545 Low back pain, unspecified: Secondary | ICD-10-CM

## 2024-01-03 ENCOUNTER — Ambulatory Visit: Payer: Federal, State, Local not specified - PPO | Admitting: Student

## 2024-01-07 ENCOUNTER — Other Ambulatory Visit: Payer: Self-pay | Admitting: Student

## 2024-01-07 DIAGNOSIS — E785 Hyperlipidemia, unspecified: Secondary | ICD-10-CM

## 2024-01-16 ENCOUNTER — Other Ambulatory Visit: Payer: Self-pay | Admitting: Student

## 2024-01-16 DIAGNOSIS — E1140 Type 2 diabetes mellitus with diabetic neuropathy, unspecified: Secondary | ICD-10-CM

## 2024-01-24 ENCOUNTER — Encounter: Payer: Self-pay | Admitting: Student

## 2024-01-24 ENCOUNTER — Ambulatory Visit: Admitting: Student

## 2024-01-24 VITALS — BP 131/80 | HR 97 | Ht 73.0 in | Wt 203.8 lb

## 2024-01-24 DIAGNOSIS — N529 Male erectile dysfunction, unspecified: Secondary | ICD-10-CM

## 2024-01-24 DIAGNOSIS — E785 Hyperlipidemia, unspecified: Secondary | ICD-10-CM

## 2024-01-24 DIAGNOSIS — K219 Gastro-esophageal reflux disease without esophagitis: Secondary | ICD-10-CM | POA: Insufficient documentation

## 2024-01-24 DIAGNOSIS — E11649 Type 2 diabetes mellitus with hypoglycemia without coma: Secondary | ICD-10-CM | POA: Diagnosis not present

## 2024-01-24 DIAGNOSIS — E119 Type 2 diabetes mellitus without complications: Secondary | ICD-10-CM

## 2024-01-24 DIAGNOSIS — I10 Essential (primary) hypertension: Secondary | ICD-10-CM

## 2024-01-24 DIAGNOSIS — E1140 Type 2 diabetes mellitus with diabetic neuropathy, unspecified: Secondary | ICD-10-CM

## 2024-01-24 DIAGNOSIS — K861 Other chronic pancreatitis: Secondary | ICD-10-CM

## 2024-01-24 DIAGNOSIS — Z7985 Long-term (current) use of injectable non-insulin antidiabetic drugs: Secondary | ICD-10-CM

## 2024-01-24 LAB — POCT GLYCOSYLATED HEMOGLOBIN (HGB A1C): Hemoglobin A1C: 6.9 % — AB (ref 4.0–5.6)

## 2024-01-24 MED ORDER — LISINOPRIL 40 MG PO TABS: ORAL_TABLET | ORAL | 3 refills | Status: DC

## 2024-01-24 MED ORDER — HYDROCHLOROTHIAZIDE 25 MG PO TABS: 25.0000 mg | ORAL_TABLET | Freq: Every day | ORAL | 3 refills | Status: DC

## 2024-01-24 MED ORDER — ONDANSETRON HCL 8 MG PO TABS: 8.0000 mg | ORAL_TABLET | Freq: Three times a day (TID) | ORAL | 0 refills | Status: AC | PRN

## 2024-01-24 MED ORDER — ATORVASTATIN CALCIUM 40 MG PO TABS: 40.0000 mg | ORAL_TABLET | Freq: Every day | ORAL | 3 refills | Status: AC

## 2024-01-24 MED ORDER — SILDENAFIL CITRATE 100 MG PO TABS
50.0000 mg | ORAL_TABLET | Freq: Every day | ORAL | 2 refills | Status: AC | PRN
Start: 2024-01-24 — End: ?

## 2024-01-24 MED ORDER — EMPAGLIFLOZIN 25 MG PO TABS: 25.0000 mg | ORAL_TABLET | Freq: Every day | ORAL | 3 refills | Status: DC

## 2024-01-24 MED ORDER — METFORMIN HCL 1000 MG PO TABS
1000.0000 mg | ORAL_TABLET | Freq: Two times a day (BID) | ORAL | 3 refills | Status: AC
Start: 2024-01-24 — End: ?

## 2024-01-24 MED ORDER — OZEMPIC (2 MG/DOSE) 8 MG/3ML ~~LOC~~ SOPN: 2.0000 mg | PEN_INJECTOR | SUBCUTANEOUS | 3 refills | Status: DC

## 2024-01-24 NOTE — Assessment & Plan Note (Addendum)
 LDL under goal last year. Has been complaint with lipitor.  -continue lipitor, refills sent to pharmacy

## 2024-01-24 NOTE — Assessment & Plan Note (Addendum)
 Blood pressure well-managed on lisinopril and hydrochlorothiazide. - Continue lisinopril and hydrochlorothiazide. Refills sent to pharmacy  -BMP today

## 2024-01-24 NOTE — Assessment & Plan Note (Addendum)
 Hemoglobin A1c at 6.9 indicates good control. - Continue Jardiance, metformin and Ozempic. Refills sent to pharmacy - Patient prefers to do urine microalbumin creatinine ratio at future visit as he does not need to urinate -Can discuss referral for ophthalmology for diabetic eye exam at next visit

## 2024-01-24 NOTE — Progress Notes (Signed)
 SUBJECTIVE:   CHIEF COMPLAINT / HPI:   The patient, a 53 year old with a history of diabetes, hypertension, and hyperlipidemia, presents for a routine follow-up. The patient has recently quit smoking, a habit he had for 35 years, and has not used any nicotine replacement therapies.  The patient also reports occasional heartburn, which seems to be triggered acidic foods and worse with stress.  Only occurs a couple times a week.  He has tried over-the-counter Nexium as needed, not sure if it is helping.  The patient also expresses concerns about erectile dysfunction and suspects low testosterone levels might be the cause. He has been using Viagra, which he reports as effective, and requests a refill of this medication. He reports feeling sluggish at times and has been consuming energy drinks to combat this.The patient also mentions a desire to have his testosterone levels checked.  For diabetes, he is currently on a regimen of Jardiance, ozempic and metformin for diabetes management. The patient's hypertension and hyperlipidemia are also reportedly well-managed with hydrochlorothiazide, lisinopril, and Lipitor. The patient also takes Zofran as needed for occasional nausea, which he believes might be related to his pain medication which is prescribed by a different physician     OBJECTIVE:   BP 131/80   Pulse 97   Ht 6\' 1"  (1.854 m)   Wt 203 lb 12.8 oz (92.4 kg)   SpO2 99%   BMI 26.89 kg/m    General: NAD, pleasant, able to participate in exam Cardiac: RRR, no murmurs. Respiratory: CTAB, normal effort, No wheezes, rales or rhonchi Extremities: Left BKA, right foot with no deformities or wounds, decreased sensation of right great toe with monofilament testing but otherwise sensation intact, dorsalis pedis and posterior tibial pulses present with cap refill of great toe less than 2 seconds Skin: warm and dry Neuro: alert, no obvious focal deficits Psych: Normal affect and  mood  ASSESSMENT/PLAN:   Essential hypertension Blood pressure well-managed on lisinopril and hydrochlorothiazide. - Continue lisinopril and hydrochlorothiazide. Refills sent to pharmacy  -BMP today  Hyperlipidemia LDL under goal last year. Has been complaint with lipitor.  -continue lipitor, refills sent to pharmacy   Erectile dysfunction Viagra effective. Pt requests checking testosterone to see if low testosterone could be contributing to erectile dysfunction and low energy. - prescription for Viagra. - Order testosterone level test. - Consider testosterone gel if levels are low. - Monitor hematocrit if testosterone therapy is initiated.  Type 2 diabetes mellitus with diabetic neuropathy (HCC) Hemoglobin A1c at 6.9 indicates good control. - Continue Jardiance, metformin and Ozempic. Refills sent to pharmacy - Patient prefers to do urine microalbumin creatinine ratio at future visit as he does not need to urinate -Can discuss referral for ophthalmology for diabetic eye exam at next visit  GERD (gastroesophageal reflux disease) Patient experiences heartburn after eating acidic foods a couple times per week, also notes it seems worse when he is stressed out.  Recommended Pepcid for as-needed use. - Try over-the-counter Pepcid as needed. - Record frequency of heartburn symptoms for next visit, if no improvement can consider PPI   General Health Maintenance and follow up Due for colonoscopy or Cologuard test. Prefers to wait for new insurance which he will get next month - Discuss colonoscopy or Cologuard test at next visit after insurance change. - Obtain microalbumin creatinine ratio and discuss referral to ophthalmology and vaccines at next visit in May if appropriate    Dr. Erick Alley, DO [place] [facility]

## 2024-01-24 NOTE — Assessment & Plan Note (Addendum)
 Patient experiences heartburn after eating acidic foods a couple times per week, also notes it seems worse when he is stressed out.  Recommended Pepcid for as-needed use. - Try over-the-counter Pepcid as needed. - Record frequency of heartburn symptoms for next visit, if no improvement can consider PPI

## 2024-01-24 NOTE — Assessment & Plan Note (Addendum)
 Viagra effective. Pt requests checking testosterone to see if low testosterone could be contributing to erectile dysfunction and low energy. - prescription for Viagra. - Order testosterone level test. - Consider testosterone gel if levels are low. - Monitor hematocrit if testosterone therapy is initiated.

## 2024-01-24 NOTE — Patient Instructions (Addendum)
 It was great to see you! Thank you for allowing me to participate in your care!  I recommend that you always bring your medications to each appointment as this makes it easy to ensure you are on the correct medications and helps us  not miss when refills are needed.  Our plans for today:  -Can try over the counter Pepcid for heart burn. Record how often you are having to take it before our next visit.  - Continue current medications. Refills have been sent.  - return end of next month to discuss health maintenance   We are checking some labs today, I will call you if they are abnormal will send you a MyChart message or a letter if they are normal.  If you do not hear about your labs in the next 2 weeks please let us  know.  Take care and seek immediate care sooner if you develop any concerns.   Dr. Glenn Lange, DO [facility]

## 2024-01-25 ENCOUNTER — Encounter: Payer: Self-pay | Admitting: Student

## 2024-01-25 LAB — BASIC METABOLIC PANEL WITH GFR
BUN/Creatinine Ratio: 25 — ABNORMAL HIGH (ref 9–20)
BUN: 24 mg/dL (ref 6–24)
CO2: 23 mmol/L (ref 20–29)
Calcium: 10 mg/dL (ref 8.7–10.2)
Chloride: 101 mmol/L (ref 96–106)
Creatinine, Ser: 0.95 mg/dL (ref 0.76–1.27)
Glucose: 114 mg/dL — ABNORMAL HIGH (ref 70–99)
Potassium: 4.3 mmol/L (ref 3.5–5.2)
Sodium: 142 mmol/L (ref 134–144)
eGFR: 95 mL/min/{1.73_m2} (ref >59)

## 2024-02-01 LAB — TESTOSTERONE, FREE AND TOTAL (INCLUDES SHBG)-(MALES)
% Free Testosterone: 2.8 %
Free Testosterone, S: 63 pg/mL
Sex Hormone Binding Globulin: 21.6 nmol/L
Testosterone, Serum (Total): 225 ng/dL — ABNORMAL LOW

## 2024-02-02 ENCOUNTER — Encounter: Payer: Self-pay | Admitting: Student

## 2024-02-03 ENCOUNTER — Other Ambulatory Visit: Payer: Self-pay | Admitting: Student

## 2024-02-03 ENCOUNTER — Encounter: Payer: Self-pay | Admitting: Student

## 2024-02-03 DIAGNOSIS — N529 Male erectile dysfunction, unspecified: Secondary | ICD-10-CM

## 2024-02-03 NOTE — Progress Notes (Signed)
 Total testosterone  slightly low however free testosterone  is normal.  Patient is very interested in starting testosterone  gel to see if this improves his libido, ED, and overall energy level.  Discussed we will need to repeat the testosterone  and we will also get a CBC to monitor hematocrit in anticipation of starting androgel .  Patient vies to schedule lab only appointment sometime in the morning.

## 2024-02-09 ENCOUNTER — Other Ambulatory Visit

## 2024-02-09 DIAGNOSIS — N529 Male erectile dysfunction, unspecified: Secondary | ICD-10-CM

## 2024-02-15 ENCOUNTER — Telehealth: Payer: Self-pay | Admitting: Student

## 2024-02-15 ENCOUNTER — Encounter: Payer: Self-pay | Admitting: Student

## 2024-02-15 LAB — CBC
Hematocrit: 44.5 % (ref 37.5–51.0)
Hemoglobin: 14.3 g/dL (ref 13.0–17.7)
MCH: 30 pg (ref 26.6–33.0)
MCHC: 32.1 g/dL (ref 31.5–35.7)
MCV: 93 fL (ref 79–97)
Platelets: 278 10*3/uL (ref 150–450)
RBC: 4.77 x10E6/uL (ref 4.14–5.80)
RDW: 13.5 % (ref 11.6–15.4)
WBC: 6.6 10*3/uL (ref 3.4–10.8)

## 2024-02-15 LAB — TESTOSTERONE, FREE, TOTAL, SHBG
Sex Hormone Binding: 17.5 nmol/L — ABNORMAL LOW (ref 19.3–76.4)
Testosterone, Free: 2.9 pg/mL — ABNORMAL LOW (ref 7.2–24.0)
Testosterone: 218 ng/dL — ABNORMAL LOW (ref 264–916)

## 2024-02-15 NOTE — Telephone Encounter (Signed)
 Attempted to call patient to discuss recent lab results. His VM box was full.  I will leave mychart message.

## 2024-02-17 ENCOUNTER — Telehealth: Payer: Self-pay | Admitting: Student

## 2024-02-17 ENCOUNTER — Telehealth: Payer: Self-pay

## 2024-02-17 ENCOUNTER — Encounter: Payer: Self-pay | Admitting: Student

## 2024-02-17 DIAGNOSIS — Z79899 Other long term (current) drug therapy: Secondary | ICD-10-CM

## 2024-02-17 DIAGNOSIS — R7989 Other specified abnormal findings of blood chemistry: Secondary | ICD-10-CM

## 2024-02-17 MED ORDER — TESTOSTERONE 50 MG/5GM (1%) TD GEL: 5.0000 g | Freq: Every day | TRANSDERMAL | 0 refills | Status: DC

## 2024-02-17 NOTE — Telephone Encounter (Signed)
 Called pt to discuss low testosterone  levels on two different occasions. The first time morning and fasting total testosterone  was low and the second time, both total and free testosterone  were low.  Pt reports erectile dysfunction along with feel fatigued and not like his normal self for the past few years.  He would like to try topical testosterone  therapy to see if this will help with his symptoms. We discussed potential risks and benefits including prostate cancer, BPH, CV risk, and VTEs.  CBC was recently checked and hematocrit is acceptable at 44.5.  Will need to check PSA, lipid panel and CMP to get baseline labs and plan, lab only apt scheduled for 5/12. Pt advised to apply 50 mg androgel  to shoulders daily and to wash his hands thoroughly after and to avoid direct skin to skin contact between his shoulders and others unless it is first washed off.  He agrees to return in 3 months with new PCP to check in, see if he is responding to the testosterone  (can trial increasing to 100 mg dose if not) and recheck CBC, CMP, PSA, and lipid panel.

## 2024-02-17 NOTE — Telephone Encounter (Signed)
 Pharmacy Patient Advocate Encounter   Received notification from CoverMyMeds that prior authorization for Testosterone  50 MG/5GM(1%) gel is required/requested.   Insurance verification completed.   The patient is insured through [facility] .   PA required; PA submitted to above mentioned insurance via CoverMyMeds Key/confirmation #/EOC [facility]. Status is pending

## 2024-02-20 ENCOUNTER — Other Ambulatory Visit: Payer: Self-pay

## 2024-02-20 DIAGNOSIS — Z79899 Other long term (current) drug therapy: Secondary | ICD-10-CM

## 2024-02-20 NOTE — Telephone Encounter (Signed)
 Pharmacy Patient Advocate Encounter  Received notification from [facility] FEP that Prior Authorization for Testosterone  50 MG/5GM(1%) gel has been DENIED.  Full denial letter will be uploaded to the media tab. See denial reason below.  the use of this medication with a baseline prostate specific antigen (PSA) 4 nanograms per milliliter or higher, or without providing a baseline PSA level does not establish medical necessity for this drug. Medical necessity is determined by adherence to generally accepted standards of medical practice in the [place] , is clinically appropriate, in terms of type, frequency, extent, site, duration and considered effective for the patient's illness, injury, disease, or its symptoms.  PA #/Case ID/Reference #: [id number]

## 2024-02-21 ENCOUNTER — Telehealth: Payer: Self-pay | Admitting: Student

## 2024-02-21 ENCOUNTER — Other Ambulatory Visit (HOSPITAL_COMMUNITY): Payer: Self-pay

## 2024-02-21 DIAGNOSIS — I10 Essential (primary) hypertension: Secondary | ICD-10-CM

## 2024-02-21 LAB — COMPREHENSIVE METABOLIC PANEL WITH GFR
ALT: 26 IU/L (ref 0–44)
AST: 19 IU/L (ref 0–40)
Albumin: 4.5 g/dL (ref 3.8–4.9)
Alkaline Phosphatase: 50 IU/L (ref 44–121)
BUN/Creatinine Ratio: 22 — ABNORMAL HIGH (ref 9–20)
BUN: 21 mg/dL (ref 6–24)
Bilirubin Total: 0.2 mg/dL (ref 0.0–1.2)
CO2: 25 mmol/L (ref 20–29)
Calcium: 11 mg/dL — ABNORMAL HIGH (ref 8.7–10.2)
Chloride: 101 mmol/L (ref 96–106)
Creatinine, Ser: 0.97 mg/dL (ref 0.76–1.27)
Globulin, Total: 1.9 g/dL (ref 1.5–4.5)
Glucose: 128 mg/dL — ABNORMAL HIGH (ref 70–99)
Potassium: 5.3 mmol/L — ABNORMAL HIGH (ref 3.5–5.2)
Sodium: 143 mmol/L (ref 134–144)
Total Protein: 6.4 g/dL (ref 6.0–8.5)
eGFR: 93 mL/min/{1.73_m2} (ref >59)

## 2024-02-21 LAB — LIPID PANEL
Chol/HDL Ratio: 2.4 ratio (ref 0.0–5.0)
Cholesterol, Total: 83 mg/dL — ABNORMAL LOW (ref 100–199)
HDL: 35 mg/dL — ABNORMAL LOW (ref >39)
LDL Chol Calc (NIH): 31 mg/dL (ref 0–99)
Triglycerides: 81 mg/dL (ref 0–149)
VLDL Cholesterol Cal: 17 mg/dL (ref 5–40)

## 2024-02-21 LAB — PSA: Prostate Specific Ag, Serum: 0.2 ng/mL (ref 0.0–4.0)

## 2024-02-21 MED ORDER — AMLODIPINE BESYLATE 5 MG PO TABS: 5.0000 mg | ORAL_TABLET | Freq: Every day | ORAL | 3 refills | Status: DC

## 2024-02-21 NOTE — Telephone Encounter (Signed)
 New request resent, including add'tl documentation.  CMM Key: [id number]

## 2024-02-21 NOTE — Telephone Encounter (Signed)
 Called pt to discuss recent labs showing very mild hyperkalemia to 5.3 though reassuringly normal kidney function and hypercalcemia to 11.  He is on hydrochlorothiazide  Stop hydrochlorothiazide  given it may be the cause of hypercalcemia, start amlodipine 5 mg for BP control in addition to lisinopril   Return in 2 weeks for BP check and assess need for diuretic as patient states that HCTZ also help control edema Plan to repeat BMP to assess calcium  levels in about 4 weeks.  If still elevated, will pursue workup for hyperparathyroidism

## 2024-02-22 NOTE — Telephone Encounter (Signed)
 Pharmacy Patient Advocate Encounter  Received notification from [facility] FEP that Prior Authorization for Testosterone  50 MG/5GM(1%) gel  has been APPROVED from 02/22/24 to 08/20/24   PA #/Case ID/Reference #: [id number]

## 2024-02-22 NOTE — Telephone Encounter (Addendum)
 PA denied:      Will resubmit a 2nd time and include Hematocrit labs (44.5)   NEW KEY: [id number]

## 2024-03-09 ENCOUNTER — Ambulatory Visit: Payer: Self-pay | Admitting: Student

## 2024-03-09 ENCOUNTER — Encounter: Payer: Self-pay | Admitting: Student

## 2024-03-09 VITALS — BP 126/72 | HR 96 | Ht 73.0 in | Wt 207.4 lb

## 2024-03-09 DIAGNOSIS — R609 Edema, unspecified: Secondary | ICD-10-CM

## 2024-03-09 DIAGNOSIS — I10 Essential (primary) hypertension: Secondary | ICD-10-CM

## 2024-03-09 MED ORDER — FUROSEMIDE 20 MG PO TABS: 20.0000 mg | ORAL_TABLET | Freq: Every day | ORAL | 0 refills | Status: DC | PRN

## 2024-03-09 NOTE — Progress Notes (Signed)
    SUBJECTIVE:   CHIEF COMPLAINT / HPI:   [name] [name] is a 53 year old male with hypertension who presents for a blood pressure check after a medication change.  Two weeks ago, hydrochlorothiazide  was discontinued due to hypercalcemia, and amlodipine  5 mg was initiated. He continues on lisinopril . Since the change, he experiences fluid retention, with weight increasing from 203 lbs to 207 lbs and decreased urination. He feels bloated and limits water intake to avoid this sensation. He has a long history of hydrochlorothiazide  use, which he felt managed fluid retention effectively.    PERTINENT  PMH / PSH: HTN  OBJECTIVE:   BP 126/72   Pulse 96   Ht 6\' 1"  (1.854 m)   Wt 207 lb 6.4 oz (94.1 kg)   SpO2 97%   BMI 27.36 kg/m    General: NAD, pleasant, well-appearing Cardiac: Well-perfused Extremities: No edema noted Respiratory: Normal effort Neuro: alert, no obvious focal deficits Psych: Normal affect and mood  ASSESSMENT/PLAN:   Essential hypertension Controlled with amlodipine  and lisinopril .  - Continue amlodipine  and lisinopril   Hypercalcemia Hydrochlorothiazide  discontinued ~ 2 weeks ago due to hypercalcemia. - Recheck calcium  levels in two weeks, future BMP ordered for lab only visit   Fluid retention Symptoms worsened post-hydrochlorothiazide  discontinuation. Fluid retention and bloating noted.  No edema on exam. - Prescribe furosemide  20 mg prn for fluid retention per patient request - Schedule lab visit in two weeks for BMP and electrolytes.     Dr. Glenn Lange, DO [facility]

## 2024-03-09 NOTE — Patient Instructions (Addendum)
 It was great to see you! Thank you for allowing me to participate in your care!   Our plans for today:  - Lasix  send to pharmacy to use once daily as needed for fluid overload  - return in 2 weeks labs     Take care and seek immediate care sooner if you develop any concerns.   Dr. Glenn Lange, DO [facility]

## 2024-03-10 DIAGNOSIS — R609 Edema, unspecified: Secondary | ICD-10-CM | POA: Insufficient documentation

## 2024-03-10 NOTE — Assessment & Plan Note (Signed)
 Symptoms worsened post-hydrochlorothiazide  discontinuation. Fluid retention and bloating noted.  No edema on exam. - Prescribe furosemide  20 mg prn for fluid retention per patient request - Schedule lab visit in two weeks for BMP and electrolytes.

## 2024-03-10 NOTE — Assessment & Plan Note (Signed)
 Controlled with amlodipine  and lisinopril .  - Continue amlodipine  and lisinopril 

## 2024-03-10 NOTE — Assessment & Plan Note (Signed)
 Hydrochlorothiazide  discontinued ~ 2 weeks ago due to hypercalcemia. - Recheck calcium  levels in two weeks, future BMP ordered for lab only visit

## 2024-03-15 ENCOUNTER — Other Ambulatory Visit: Payer: Self-pay | Admitting: Student

## 2024-03-15 DIAGNOSIS — R7989 Other specified abnormal findings of blood chemistry: Secondary | ICD-10-CM

## 2024-03-15 MED ORDER — TESTOSTERONE 50 MG/5GM (1%) TD GEL: 5.0000 g | Freq: Every day | TRANSDERMAL | 0 refills | Status: DC

## 2024-03-20 ENCOUNTER — Encounter: Payer: Self-pay | Admitting: *Deleted

## 2024-03-23 ENCOUNTER — Other Ambulatory Visit

## 2024-03-27 ENCOUNTER — Telehealth: Payer: Self-pay | Admitting: *Deleted

## 2024-03-27 NOTE — Telephone Encounter (Signed)
 Patient returns call to nurse line.   Advised of repeat labs due to [facility] error.  Patient scheduled for BMP 6/27.  Will forward to PCP to place future lab orders.

## 2024-03-27 NOTE — Telephone Encounter (Signed)
 Left message for patient to return call. He needs to schedule a lab appointment to recollect labs. Samples were not picked up by the courier on Friday. Ardeth Beckers Walfred Bettendorf

## 2024-03-28 ENCOUNTER — Other Ambulatory Visit: Payer: Self-pay | Admitting: Student

## 2024-03-28 LAB — BASIC METABOLIC PANEL WITH GFR

## 2024-03-28 NOTE — Progress Notes (Signed)
 Future BMP ordered to evaluate hyperkalemia and hypercalcemia since coming off of hydrochlorothiazide . If Ca still elevated will need further work up with ionized Ca and PTH

## 2024-03-30 ENCOUNTER — Telehealth: Payer: Self-pay

## 2024-03-30 NOTE — Telephone Encounter (Signed)
 Pharmacy Patient Advocate Encounter   Received notification from CoverMyMeds that prior authorization for OZEMPIC  2MG  DOSE PENS is required/requested.   Insurance verification completed.   The patient is insured through [facility] .   PA required; PA submitted to above mentioned insurance via CoverMyMeds Key/confirmation #/EOC [id number]. Status is pending

## 2024-04-02 NOTE — Telephone Encounter (Signed)
 Pharmacy Patient Advocate Encounter  Received notification from [employer] that Prior Authorization for OZEMPIC  2MG  DOSE PENS has been APPROVED from 03/30/24 to 10/10/24   PA #/Case ID/Reference #: [id number]

## 2024-04-06 ENCOUNTER — Other Ambulatory Visit

## 2024-04-19 ENCOUNTER — Other Ambulatory Visit: Payer: Self-pay | Admitting: Family Medicine

## 2024-04-19 DIAGNOSIS — R7989 Other specified abnormal findings of blood chemistry: Secondary | ICD-10-CM

## 2024-04-19 MED ORDER — TESTOSTERONE 50 MG/5GM (1%) TD GEL: 5.0000 g | Freq: Every day | TRANSDERMAL | 0 refills | Status: DC

## 2024-05-16 NOTE — Progress Notes (Addendum)
    SUBJECTIVE:   CHIEF COMPLAINT / HPI:    [name] [name] is a 53 year old M presents for low testosterone  follow-up. - Was previously started on androgel  for low testosterone  - Due for lab monitoring - Pt is interested in increasing his dose if possible  - Requesting refill on lasix  and ozempic . Reports he has been out of these medications. He has missed 1 dose of the ozempic . - Pt thinks that he may be retaining fluid. He is gaining weight and reports no change in diet. He used to be on hydrochlorothiazide  but this was held due to concern for hypercalcemia.  - Denies hx of heart or kidney disease - Denies SOB or swelling     PERTINENT  PMH / PSH: HTN, ED, T2DM  OBJECTIVE:   BP 123/72   Pulse 84   Wt 215 lb 6.4 oz (97.7 kg)   SpO2 100%   BMI 28.42 kg/m   General: Alert, pleasant man. NAD. HEENT: NCAT. MMM. CV: RRR, no murmurs.   Resp: CTAB, no wheezing or crackles. Normal WOB on RA.   Ext: Moves all ext spontaneously Skin: Warm, well perfused   ASSESSMENT/PLAN:   Assessment & Plan Low testosterone  in male Repeat testosterone  today. Adjust androgel  dosing based on results if needed. Also discussed that his ED may be exacerbated by his other CVD risk factors (prior smoker, T2DM, HTN) - f/u AM testosterone  - Cont androgel  1% topical daily Type 2 diabetes mellitus with diabetic neuropathy, unspecified whether long term insulin  use (HCC) A1c 7.4, abiove goal. Will refill ozempic . - Restart ozempic   - Cont jardiance , metformin  - Unable to get UACR today, will need at f/u Hyperkalemia Also had elevated K on 5/12. Was planning to get repeat BMP, but this was not completed. Will repeat BMP today. Weight gain Suspect weight gain may be 2/2 being off ozempic . No signs of fluid overload on exam or hx of kidney or heart disease. Discussed that lasix  is not indicated at this time.  - Restart ozempic  as above    Twyla Nearing, MD [facility]

## 2024-05-17 ENCOUNTER — Encounter: Payer: Self-pay | Admitting: Family Medicine

## 2024-05-17 ENCOUNTER — Ambulatory Visit (INDEPENDENT_AMBULATORY_CARE_PROVIDER_SITE_OTHER): Admitting: Family Medicine

## 2024-05-17 VITALS — BP 123/72 | HR 84 | Wt 215.4 lb

## 2024-05-17 DIAGNOSIS — E1140 Type 2 diabetes mellitus with diabetic neuropathy, unspecified: Secondary | ICD-10-CM | POA: Diagnosis not present

## 2024-05-17 DIAGNOSIS — R7989 Other specified abnormal findings of blood chemistry: Secondary | ICD-10-CM

## 2024-05-17 DIAGNOSIS — E875 Hyperkalemia: Secondary | ICD-10-CM

## 2024-05-17 DIAGNOSIS — R609 Edema, unspecified: Secondary | ICD-10-CM

## 2024-05-17 DIAGNOSIS — R635 Abnormal weight gain: Secondary | ICD-10-CM | POA: Diagnosis not present

## 2024-05-17 LAB — POCT GLYCOSYLATED HEMOGLOBIN (HGB A1C): HbA1c, POC (controlled diabetic range): 7.4 % — AB (ref 0.0–7.0)

## 2024-05-17 MED ORDER — OZEMPIC (2 MG/DOSE) 8 MG/3ML ~~LOC~~ SOPN: 2.0000 mg | PEN_INJECTOR | SUBCUTANEOUS | 3 refills | Status: DC

## 2024-05-17 NOTE — Assessment & Plan Note (Addendum)
 A1c 7.4, abiove goal. Will refill ozempic .  - Restart ozempic   - Cont jardiance , metformin  - Unable to get UACR today, will need at f/u

## 2024-05-17 NOTE — Patient Instructions (Addendum)
 Good to see you today - Thank you for coming in  Things we discussed today:  1) Your A1c was high today. This is probably from stopping your ozempic .  - I recommend continuing your metformin , jardiance , and ozempic  to make sure your diabetes is under control  2) We will test your testosterone  level today.   3) You are due for a colonoscopy and a diabetic eye exam as part of your routine health care. Please schedule these or ask your PCP for more information to get you set-up.   Come back to see your primary doctor in 3 months to recheck your A1c.

## 2024-05-18 LAB — BASIC METABOLIC PANEL WITH GFR
BUN/Creatinine Ratio: 22 — ABNORMAL HIGH (ref 9–20)
BUN: 19 mg/dL (ref 6–24)
CO2: 24 mmol/L (ref 20–29)
Calcium: 10.2 mg/dL (ref 8.7–10.2)
Chloride: 95 mmol/L — ABNORMAL LOW (ref 96–106)
Creatinine, Ser: 0.86 mg/dL (ref 0.76–1.27)
Glucose: 132 mg/dL — ABNORMAL HIGH (ref 70–99)
Potassium: 4.9 mmol/L (ref 3.5–5.2)
Sodium: 139 mmol/L (ref 134–144)
eGFR: 103 mL/min/1.73 (ref >59)

## 2024-05-18 LAB — TESTOSTERONE: Testosterone: 181 ng/dL — ABNORMAL LOW (ref 264–916)

## 2024-05-22 ENCOUNTER — Ambulatory Visit: Payer: Self-pay | Admitting: Family Medicine

## 2024-05-22 MED ORDER — TESTOSTERONE 50 MG/5GM (1%) TD GEL: 7.5000 g | Freq: Every day | TRANSDERMAL | 0 refills | Status: DC

## 2024-05-22 NOTE — Addendum Note (Signed)
 Addended by: ELICIA HAMLET on: 05/22/2024 04:38 PM   Modules accepted: Orders

## 2024-07-22 ENCOUNTER — Encounter (HOSPITAL_BASED_OUTPATIENT_CLINIC_OR_DEPARTMENT_OTHER): Payer: Self-pay

## 2024-07-22 ENCOUNTER — Other Ambulatory Visit: Payer: Self-pay

## 2024-07-22 ENCOUNTER — Emergency Department (HOSPITAL_BASED_OUTPATIENT_CLINIC_OR_DEPARTMENT_OTHER)

## 2024-07-22 ENCOUNTER — Inpatient Hospital Stay (HOSPITAL_BASED_OUTPATIENT_CLINIC_OR_DEPARTMENT_OTHER)
Admission: EM | Admit: 2024-07-22 | Discharge: 2024-07-23 | DRG: 439 | Discharge status: Against Medical Advice | Attending: Internal Medicine | Admitting: Internal Medicine

## 2024-07-22 DIAGNOSIS — Z9682 Presence of neurostimulator: Secondary | ICD-10-CM

## 2024-07-22 DIAGNOSIS — K8500 Idiopathic acute pancreatitis without necrosis or infection: Secondary | ICD-10-CM

## 2024-07-22 DIAGNOSIS — Z7984 Long term (current) use of oral hypoglycemic drugs: Secondary | ICD-10-CM

## 2024-07-22 DIAGNOSIS — Z89512 Acquired absence of left leg below knee: Secondary | ICD-10-CM

## 2024-07-22 DIAGNOSIS — K5909 Other constipation: Secondary | ICD-10-CM | POA: Diagnosis present

## 2024-07-22 DIAGNOSIS — K859 Acute pancreatitis without necrosis or infection, unspecified: Secondary | ICD-10-CM | POA: Diagnosis present

## 2024-07-22 DIAGNOSIS — K861 Other chronic pancreatitis: Secondary | ICD-10-CM | POA: Diagnosis present

## 2024-07-22 DIAGNOSIS — E1140 Type 2 diabetes mellitus with diabetic neuropathy, unspecified: Secondary | ICD-10-CM | POA: Diagnosis present

## 2024-07-22 DIAGNOSIS — Z888 Allergy status to other drugs, medicaments and biological substances status: Secondary | ICD-10-CM

## 2024-07-22 DIAGNOSIS — Z79899 Other long term (current) drug therapy: Secondary | ICD-10-CM | POA: Diagnosis not present

## 2024-07-22 DIAGNOSIS — F1120 Opioid dependence, uncomplicated: Secondary | ICD-10-CM | POA: Diagnosis present

## 2024-07-22 DIAGNOSIS — I10 Essential (primary) hypertension: Secondary | ICD-10-CM | POA: Diagnosis present

## 2024-07-22 DIAGNOSIS — Z9151 Personal history of suicidal behavior: Secondary | ICD-10-CM | POA: Diagnosis not present

## 2024-07-22 DIAGNOSIS — F17210 Nicotine dependence, cigarettes, uncomplicated: Secondary | ICD-10-CM | POA: Diagnosis present

## 2024-07-22 DIAGNOSIS — F419 Anxiety disorder, unspecified: Secondary | ICD-10-CM | POA: Diagnosis present

## 2024-07-22 DIAGNOSIS — K2980 Duodenitis without bleeding: Secondary | ICD-10-CM | POA: Diagnosis present

## 2024-07-22 DIAGNOSIS — K219 Gastro-esophageal reflux disease without esophagitis: Secondary | ICD-10-CM | POA: Diagnosis present

## 2024-07-22 DIAGNOSIS — Z7985 Long-term (current) use of injectable non-insulin antidiabetic drugs: Secondary | ICD-10-CM

## 2024-07-22 DIAGNOSIS — E781 Pure hyperglyceridemia: Secondary | ICD-10-CM | POA: Diagnosis present

## 2024-07-22 LAB — CBC WITH DIFFERENTIAL/PLATELET
Abs Immature Granulocytes: 0.02 K/uL (ref 0.00–0.07)
Basophils Absolute: 0 K/uL (ref 0.0–0.1)
Basophils Relative: 1 %
Eosinophils Absolute: 0.1 K/uL (ref 0.0–0.5)
Eosinophils Relative: 2 %
HCT: 41.3 % (ref 39.0–52.0)
Hemoglobin: 14.5 g/dL (ref 13.0–17.0)
Immature Granulocytes: 0 %
Lymphocytes Relative: 29 %
Lymphs Abs: 1.9 K/uL (ref 0.7–4.0)
MCH: 30.4 pg (ref 26.0–34.0)
MCHC: 35.1 g/dL (ref 30.0–36.0)
MCV: 86.6 fL (ref 80.0–100.0)
Monocytes Absolute: 0.4 K/uL (ref 0.1–1.0)
Monocytes Relative: 6 %
Neutro Abs: 4 K/uL (ref 1.7–7.7)
Neutrophils Relative %: 62 %
Platelets: 231 K/uL (ref 150–400)
RBC: 4.77 MIL/uL (ref 4.22–5.81)
RDW: 14.1 % (ref 11.5–15.5)
WBC: 6.5 K/uL (ref 4.0–10.5)
nRBC: 0 % (ref 0.0–0.2)

## 2024-07-22 LAB — COMPREHENSIVE METABOLIC PANEL WITH GFR
ALT: 29 U/L (ref 0–44)
AST: 22 U/L (ref 15–41)
Albumin: 4.4 g/dL (ref 3.5–5.0)
Alkaline Phosphatase: 62 U/L (ref 38–126)
Anion gap: 13 (ref 5–15)
BUN: 13 mg/dL (ref 6–20)
CO2: 24 mmol/L (ref 22–32)
Calcium: 10.1 mg/dL (ref 8.9–10.3)
Chloride: 101 mmol/L (ref 98–111)
Creatinine, Ser: 0.83 mg/dL (ref 0.61–1.24)
GFR, Estimated: >60 mL/min (ref >60)
Glucose, Bld: 159 mg/dL — ABNORMAL HIGH (ref 70–99)
Potassium: 3.9 mmol/L (ref 3.5–5.1)
Sodium: 138 mmol/L (ref 135–145)
Total Bilirubin: 0.6 mg/dL (ref 0.0–1.2)
Total Protein: 7.4 g/dL (ref 6.5–8.1)

## 2024-07-22 LAB — URINALYSIS, ROUTINE W REFLEX MICROSCOPIC
Bilirubin Urine: NEGATIVE
Glucose, UA: NEGATIVE mg/dL
Hgb urine dipstick: NEGATIVE
Ketones, ur: NEGATIVE mg/dL
Leukocytes,Ua: NEGATIVE
Nitrite: NEGATIVE
Specific Gravity, Urine: 1.025 (ref 1.005–1.030)
pH: 7 (ref 5.0–8.0)

## 2024-07-22 LAB — GLUCOSE, CAPILLARY
Glucose-Capillary: 112 mg/dL — ABNORMAL HIGH (ref 70–99)
Glucose-Capillary: 98 mg/dL (ref 70–99)
Glucose-Capillary: 113 mg/dL — ABNORMAL HIGH (ref 70–99)

## 2024-07-22 LAB — LIPASE, BLOOD: Lipase: 297 U/L — ABNORMAL HIGH (ref 11–51)

## 2024-07-22 LAB — TRIGLYCERIDES: Triglycerides: 161 mg/dL — ABNORMAL HIGH (ref <150)

## 2024-07-22 MED ORDER — ONDANSETRON HCL 4 MG/2ML IJ SOLN
4.0000 mg | Freq: Four times a day (QID) | INTRAMUSCULAR | Status: DC | PRN
  Administered 2024-07-23: 4 mg via INTRAVENOUS
  Filled 2024-07-22: qty 2

## 2024-07-22 MED ORDER — ENOXAPARIN SODIUM 40 MG/0.4ML IJ SOSY
40.0000 mg | PREFILLED_SYRINGE | INTRAMUSCULAR | Status: DC
  Administered 2024-07-22: 40 mg via SUBCUTANEOUS
  Filled 2024-07-22: qty 0.4

## 2024-07-22 MED ORDER — HYDROCHLOROTHIAZIDE 25 MG PO TABS
25.0000 mg | ORAL_TABLET | Freq: Every day | ORAL | Status: DC
  Administered 2024-07-23: 25 mg via ORAL
  Filled 2024-07-22: qty 1

## 2024-07-22 MED ORDER — POLYETHYLENE GLYCOL 3350 17 G PO PACK
17.0000 g | PACK | Freq: Every day | ORAL | Status: DC
  Administered 2024-07-22 – 2024-07-23 (×2): 17 g via ORAL
  Filled 2024-07-22 (×2): qty 1

## 2024-07-22 MED ORDER — LACTATED RINGERS IV BOLUS
1000.0000 mL | Freq: Once | INTRAVENOUS | Status: AC
  Administered 2024-07-22: 1000 mL via INTRAVENOUS

## 2024-07-22 MED ORDER — ACETAMINOPHEN 325 MG PO TABS
650.0000 mg | ORAL_TABLET | Freq: Four times a day (QID) | ORAL | Status: DC | PRN
  Administered 2024-07-22 – 2024-07-23 (×4): 650 mg via ORAL
  Filled 2024-07-22 (×4): qty 2

## 2024-07-22 MED ORDER — AMLODIPINE BESYLATE 5 MG PO TABS
5.0000 mg | ORAL_TABLET | Freq: Every day | ORAL | Status: DC
  Administered 2024-07-22: 5 mg via ORAL
  Filled 2024-07-22: qty 1

## 2024-07-22 MED ORDER — BISACODYL 10 MG RE SUPP: 10.0000 mg | Freq: Every day | RECTAL | Status: DC | PRN

## 2024-07-22 MED ORDER — MINERAL OIL RE ENEM
1.0000 | ENEMA | Freq: Once | RECTAL | Status: AC
  Administered 2024-07-22: 1 via RECTAL
  Filled 2024-07-22: qty 1

## 2024-07-22 MED ORDER — ONDANSETRON HCL 4 MG PO TABS
4.0000 mg | ORAL_TABLET | Freq: Four times a day (QID) | ORAL | Status: DC | PRN
  Administered 2024-07-23: 4 mg via ORAL
  Filled 2024-07-22: qty 1

## 2024-07-22 MED ORDER — MAGNESIUM CITRATE PO SOLN: 1.0000 | Freq: Once | ORAL | Status: DC | PRN

## 2024-07-22 MED ORDER — ALBUTEROL SULFATE (2.5 MG/3ML) 0.083% IN NEBU: 2.5000 mg | INHALATION_SOLUTION | RESPIRATORY_TRACT | Status: DC | PRN

## 2024-07-22 MED ORDER — INSULIN ASPART 100 UNIT/ML IJ SOLN
0.0000 [IU] | INTRAMUSCULAR | Status: DC
  Administered 2024-07-23: 2 [IU] via SUBCUTANEOUS
  Administered 2024-07-23: 3 [IU] via SUBCUTANEOUS
  Administered 2024-07-23: 2 [IU] via SUBCUTANEOUS

## 2024-07-22 MED ORDER — DOCUSATE SODIUM 100 MG PO CAPS
100.0000 mg | ORAL_CAPSULE | Freq: Two times a day (BID) | ORAL | Status: DC
  Administered 2024-07-22 – 2024-07-23 (×2): 100 mg via ORAL
  Filled 2024-07-22 (×2): qty 1

## 2024-07-22 MED ORDER — IOHEXOL 300 MG/ML  SOLN
100.0000 mL | Freq: Once | INTRAMUSCULAR | Status: AC | PRN
  Administered 2024-07-22: 100 mL via INTRAVENOUS

## 2024-07-22 MED ORDER — HYDROMORPHONE HCL 1 MG/ML IJ SOLN
0.5000 mg | INTRAMUSCULAR | Status: DC | PRN
  Administered 2024-07-22 – 2024-07-23 (×3): 1 mg via INTRAVENOUS
  Filled 2024-07-22 (×3): qty 1

## 2024-07-22 MED ORDER — LACTATED RINGERS IV SOLN: INTRAVENOUS | Status: AC

## 2024-07-22 MED ORDER — ONDANSETRON HCL 4 MG/2ML IJ SOLN
4.0000 mg | Freq: Once | INTRAMUSCULAR | Status: AC
  Administered 2024-07-22: 4 mg via INTRAVENOUS
  Filled 2024-07-22: qty 2

## 2024-07-22 MED ORDER — OXYCODONE HCL 5 MG PO TABS
5.0000 mg | ORAL_TABLET | ORAL | Status: DC | PRN
  Administered 2024-07-22 – 2024-07-23 (×2): 5 mg via ORAL
  Filled 2024-07-22 (×2): qty 1

## 2024-07-22 MED ORDER — ACETAMINOPHEN 650 MG RE SUPP: 650.0000 mg | Freq: Four times a day (QID) | RECTAL | Status: DC | PRN

## 2024-07-22 NOTE — ED Notes (Signed)
 Called [facility] to cancel the transport to [facility] rm# [id number]--patient will go POV
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
## 2024-07-22 NOTE — ED Notes (Signed)
 I have just given report to [name] [name], Charity fundraiser at [facility]. Pt. Leaves P.O.V. per Dr. Ula.

## 2024-07-22 NOTE — ED Notes (Signed)
 He has gone to the bathroom, however, he reports that he thus far has been unable to have a b.m.
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
## 2024-07-22 NOTE — ED Triage Notes (Signed)
 Pt states that he has had vomiting and chills x 2 weeks. When asked about pain, pt states that he has chronic pain , but he is only here for previously stated reasons only.

## 2024-07-22 NOTE — ED Notes (Signed)
 Patient transported to CT

## 2024-07-23 ENCOUNTER — Encounter (HOSPITAL_COMMUNITY): Payer: Self-pay | Admitting: Internal Medicine

## 2024-07-23 DIAGNOSIS — E1140 Type 2 diabetes mellitus with diabetic neuropathy, unspecified: Secondary | ICD-10-CM

## 2024-07-23 DIAGNOSIS — K5909 Other constipation: Secondary | ICD-10-CM | POA: Insufficient documentation

## 2024-07-23 DIAGNOSIS — K8500 Idiopathic acute pancreatitis without necrosis or infection: Secondary | ICD-10-CM | POA: Diagnosis not present

## 2024-07-23 DIAGNOSIS — K219 Gastro-esophageal reflux disease without esophagitis: Secondary | ICD-10-CM | POA: Diagnosis not present

## 2024-07-23 DIAGNOSIS — K861 Other chronic pancreatitis: Secondary | ICD-10-CM

## 2024-07-23 DIAGNOSIS — I10 Essential (primary) hypertension: Secondary | ICD-10-CM | POA: Diagnosis not present

## 2024-07-23 LAB — BASIC METABOLIC PANEL WITH GFR
Anion gap: 13 (ref 5–15)
BUN: 8 mg/dL (ref 6–20)
CO2: 21 mmol/L — ABNORMAL LOW (ref 22–32)
Calcium: 9.6 mg/dL (ref 8.9–10.3)
Chloride: 105 mmol/L (ref 98–111)
Creatinine, Ser: 0.64 mg/dL (ref 0.61–1.24)
GFR, Estimated: >60 mL/min (ref >60)
Glucose, Bld: 115 mg/dL — ABNORMAL HIGH (ref 70–99)
Potassium: 3.6 mmol/L (ref 3.5–5.1)
Sodium: 139 mmol/L (ref 135–145)

## 2024-07-23 LAB — CBC
HCT: 42.8 % (ref 39.0–52.0)
Hemoglobin: 14.4 g/dL (ref 13.0–17.0)
MCH: 30.1 pg (ref 26.0–34.0)
MCHC: 33.6 g/dL (ref 30.0–36.0)
MCV: 89.5 fL (ref 80.0–100.0)
Platelets: 229 K/uL (ref 150–400)
RBC: 4.78 MIL/uL (ref 4.22–5.81)
RDW: 14.1 % (ref 11.5–15.5)
WBC: 5.4 K/uL (ref 4.0–10.5)
nRBC: 0 % (ref 0.0–0.2)

## 2024-07-23 LAB — HIV ANTIBODY (ROUTINE TESTING W REFLEX): HIV Screen 4th Generation wRfx: NONREACTIVE

## 2024-07-23 LAB — GLUCOSE, CAPILLARY
Glucose-Capillary: 133 mg/dL — ABNORMAL HIGH (ref 70–99)
Glucose-Capillary: 139 mg/dL — ABNORMAL HIGH (ref 70–99)
Glucose-Capillary: 159 mg/dL — ABNORMAL HIGH (ref 70–99)
Glucose-Capillary: 83 mg/dL (ref 70–99)

## 2024-07-23 LAB — MAGNESIUM: Magnesium: 1.5 mg/dL — ABNORMAL LOW (ref 1.7–2.4)

## 2024-07-23 MED ORDER — ATORVASTATIN CALCIUM 20 MG PO TABS
40.0000 mg | ORAL_TABLET | Freq: Every day | ORAL | Status: DC
  Administered 2024-07-23: 40 mg via ORAL
  Filled 2024-07-23: qty 2

## 2024-07-23 MED ORDER — GABAPENTIN 300 MG PO CAPS
600.0000 mg | ORAL_CAPSULE | Freq: Three times a day (TID) | ORAL | Status: DC
  Administered 2024-07-23 (×2): 600 mg via ORAL
  Filled 2024-07-23: qty 6
  Filled 2024-07-23: qty 2

## 2024-07-23 MED ORDER — GABAPENTIN 100 MG PO CAPS: 300.0000 mg | ORAL_CAPSULE | Freq: Three times a day (TID) | ORAL | Status: DC

## 2024-07-23 MED ORDER — LACTATED RINGERS IV SOLN
INTRAVENOUS | Status: DC
Start: 2024-07-23 — End: 2024-07-23

## 2024-07-23 MED ORDER — MAGNESIUM SULFATE 4 GM/100ML IV SOLN
4.0000 g | Freq: Once | INTRAVENOUS | Status: AC
  Administered 2024-07-23: 4 g via INTRAVENOUS
  Filled 2024-07-23: qty 100

## 2024-07-23 MED ORDER — PANTOPRAZOLE SODIUM 40 MG IV SOLR
40.0000 mg | Freq: Two times a day (BID) | INTRAVENOUS | Status: DC
  Administered 2024-07-23: 40 mg via INTRAVENOUS
  Filled 2024-07-23: qty 10

## 2024-07-23 MED ORDER — SENNOSIDES-DOCUSATE SODIUM 8.6-50 MG PO TABS
2.0000 | ORAL_TABLET | Freq: Two times a day (BID) | ORAL | Status: DC
  Administered 2024-07-23: 2 via ORAL

## 2024-07-23 MED ORDER — POLYETHYLENE GLYCOL 3350 17 G PO PACK: 17.0000 g | PACK | Freq: Two times a day (BID) | ORAL | Status: DC

## 2024-07-23 MED ORDER — SMOG ENEMA
960.0000 mL | Freq: Once | RECTAL | Status: DC
  Filled 2024-07-23: qty 960

## 2024-07-23 MED ORDER — DIPHENHYDRAMINE HCL 25 MG PO CAPS
25.0000 mg | ORAL_CAPSULE | Freq: Once | ORAL | Status: AC | PRN
  Administered 2024-07-23: 25 mg via ORAL
  Filled 2024-07-23: qty 1

## 2024-07-23 MED ORDER — POTASSIUM CHLORIDE CRYS ER 20 MEQ PO TBCR
40.0000 meq | EXTENDED_RELEASE_TABLET | Freq: Once | ORAL | Status: AC
  Administered 2024-07-23: 40 meq via ORAL
  Filled 2024-07-23: qty 2

## 2024-07-23 MED ORDER — POLYETHYLENE GLYCOL 3350 17 G PO PACK
17.0000 g | PACK | Freq: Three times a day (TID) | ORAL | Status: DC
  Administered 2024-07-23: 17 g via ORAL
  Filled 2024-07-23: qty 1

## 2024-07-23 MED ORDER — HYDRALAZINE HCL 20 MG/ML IJ SOLN
10.0000 mg | Freq: Four times a day (QID) | INTRAMUSCULAR | Status: DC | PRN
Start: 2024-07-23 — End: 2024-07-23

## 2024-07-23 MED ORDER — PROCHLORPERAZINE EDISYLATE 10 MG/2ML IJ SOLN
10.0000 mg | Freq: Four times a day (QID) | INTRAMUSCULAR | Status: DC | PRN
  Administered 2024-07-23 (×2): 10 mg via INTRAVENOUS
  Filled 2024-07-23 (×2): qty 2

## 2024-07-23 MED ORDER — SODIUM CHLORIDE 0.9 % IV BOLUS
1000.0000 mL | Freq: Once | INTRAVENOUS | Status: AC
  Administered 2024-07-23: 1000 mL via INTRAVENOUS

## 2024-07-23 NOTE — Plan of Care (Signed)
 ?  Problem: Clinical Measurements: ?Goal: Will remain free from infection ?Outcome: Progressing ?  ?

## 2024-07-23 NOTE — Discharge Summary (Signed)
 Physician Discharge Summary  [name] [name] FMW:[id number] DOB: 1971/10/11 DOA: 07/22/2024  PCP: Lonnie Earnest, MD       Left AMA Admit date: 07/22/2024 Discharge date: 07/23/2024  Time spent: 20 minutes  Recommendations for Outpatient Follow-up:  Left AMA   Discharge Diagnoses:  Active Problems:   Type 2 diabetes mellitus with diabetic neuropathy (HCC)   Essential hypertension   Chronic pancreatitis (HCC)   GERD (gastroesophageal reflux disease)   Acute pancreatitis   Chronic constipation   Discharge Condition: Left AMA  Diet recommendation: Left AMA  Filed Weights   07/22/24 1515  Weight: 93 kg    History of present illness:  HPI per Dr. Zella [name] [name] is a 53 y.o. 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  Due to his chronic narcotics, he has chronic constipation, he just has 1 or 2 small bowel movements a week.  He does admit to some chronic bloating and abdominal discomfort likely due to his constipation.  He has been admitted to the hospital with pancreatitis several times, but the last time was several years ago.  It looks like he has a history of hypertriglyceridemia, but is on a statin and triglycerides were improved earlier this year.  He takes Ozempic , but has been on this medication for a couple of years and prior bouts of pancreatitis were before he started this medication.  He denies any fevers or chills, any hematemesis, or blood in his stool.   Hospital Course:  Patient left AMA. For hospital course please see progress note from 07/23/2024.  Procedures: CT abdomen pelvis 07/22/2024   Consultations: Gastroenterology: Dr.Brahmbhatt 07/23/2024  Discharge Exam: Vitals:   07/23/24 1008 07/23/24 1343  BP: (!) 169/90 (!) 157/104   Pulse: 92 95  Resp: 16 16  Temp: 97.7 F (36.5 C) (!) 97.5 F (36.4 C)  SpO2: 97% 94%    General: Left AMA Cardiovascular: Left AMA Respiratory: Left AMA  Discharge Instructions                                 Patient left AMA   Allergies  Allergen Reactions   Zofran  [Ondansetron ] Hives    Only IV       The results of significant diagnostics from this hospitalization (including imaging, microbiology, ancillary and laboratory) are listed below for reference.    Significant Diagnostic Studies: CT ABDOMEN PELVIS W CONTRAST Result Date: 07/22/2024 CLINICAL DATA:  Abdominal pain, acute, nonlocalized Bowel obstruction suspected EXAM: CT ABDOMEN AND PELVIS WITH CONTRAST TECHNIQUE: Multidetector CT imaging of the abdomen and pelvis was performed using the standard protocol following bolus administration of intravenous contrast. RADIATION DOSE REDUCTION: This exam was performed according to the departmental dose-optimization program which includes automated exposure control, adjustment of the mA and/or kV according to patient size and/or use of iterative reconstruction technique. CONTRAST:  OMNIPAQUE  IOHEXOL  300 MG/ML  SOLN COMPARISON:  January 09, 2018. FINDINGS: Lower chest: There is a 5 mm RIGHT lower lobe pulmonary nodule (series 3, image 9). Unchanged subpleural pulmonary nodule/scarring the LEFT lower lobe since 2019, consistent with a benign etiology (series 3, image 10). Hepatobiliary: Gallbladder is distended but unremarkable. Mildly lobular contours of the LEFT liver without focal hepatic abnormality. No intrahepatic or extrahepatic biliary ductal dilation. Pancreas:  There is some fat stranding adjacent to the uncinate process and head of the pancreas along the duodenal sweep. There is a revisualization of a hypodense mass of the tail of the pancreas measuring 11 mm, similar compared to 2019 (series 2, image 23; series 4, image 60). Spleen: Unremarkable. Adrenals/Urinary Tract:  Adrenal glands are plump without focal nodularity. Kidneys enhance symmetrically with lobulated contours. No hydronephrosis. Fluid density 17 mm mass of the RIGHT kidney most consistent with benign cyst (for which no dedicated imaging follow-up is recommended). No obstructing nephrolithiasis. Bladder is unremarkable. Stomach/Bowel: No evidence of bowel obstruction. Moderate to large colonic stool burden diffusely throughout the colon. Prominence of the second and third portion of the duodenal walls with adjacent fat stranding, favored reactive. Appendix is normal. Vascular/Lymphatic: Atherosclerotic calcifications of the nonaneurysmal abdominal aorta. Prominent LEFT periaortic lymph node at the level of the kidney measuring 11 mm in the short axis, similar compared to remote prior. Reproductive: Coarse calcifications of the prostate. Other: Postsurgical changes of the lower abdominal wall with scarring extending along the anterior margin of the bladder. Musculoskeletal: Postsurgical changes of the lower lumbar spine with severe degenerative changes at L2-3. Spinal stimulator. IMPRESSION: 1. There is some fat stranding adjacent to the uncinate process and head of the pancreas along the duodenal sweep. This is favored to reflect pancreatitis with reactive duodenitis. Recommend correlation with lipase levels. Should this be recurrent in etiology, this could reflect groove pancreatitis. 2. There is a 5 mm RIGHT lower lobe pulmonary nodule. No follow-up needed if patient is low-risk.This recommendation follows the consensus statement: Guidelines for Management of Incidental Pulmonary Nodules Detected on CT Images: From the Fleischner Society 2017; Radiology 2017; 284:228-243. 3. Moderate to large colonic stool burden diffusely throughout the colon. 4. There is a 11 mm hypodense mass of the tail of the pancreas, similar compared to 2019. 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 Recommend follow-up MRI/MRCP in 2  years. Aortic Atherosclerosis (ICD10-I70.0). Electronically Signed   By: Corean Salter M.D.   On: 07/22/2024 08:45    Microbiology: No results found for this or any previous visit (from the past 240 hours).   Labs: Basic Metabolic Panel: Recent Labs  Lab 07/22/24 0725 07/23/24 0443  NA 138 139  K 3.9 3.6  CL 101 105  CO2 24 21*  GLUCOSE 159* 115*  BUN 13 8  CREATININE 0.83 0.64  CALCIUM  10.1 9.6  MG  --  1.5*   Liver Function Tests: Recent Labs  Lab 07/22/24 0725  AST 22  ALT 29  ALKPHOS 62  BILITOT 0.6  PROT 7.4  ALBUMIN 4.4   Recent Labs  Lab 07/22/24 0725  LIPASE 297*   No results for input(s): AMMONIA in the last 168 hours. CBC: Recent Labs  Lab 07/22/24 0725 07/23/24 0443  WBC 6.5 5.4  NEUTROABS 4.0  --   HGB 14.5 14.4  HCT 41.3 42.8  MCV 86.6 89.5  PLT 231 229   Cardiac Enzymes: No results for input(s): CKTOTAL, CKMB, CKMBINDEX, TROPONINI in the last 168 hours. BNP: BNP (last 3 results) No results for input(s): BNP in the last 8760 hours.  ProBNP (last 3 results) No results for input(s): PROBNP in the last 8760 hours.  CBG: Recent Labs  Lab 07/22/24 2317 07/23/24 0400 07/23/24 0744 07/23/24 1131 07/23/24 1613  GLUCAP 113* 133* 139* 159* 83       Signed:  Toribio Hummer MD.  [facility] 07/23/2024, 5:51 PM

## 2024-07-23 NOTE — Progress Notes (Addendum)
 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  Patient also with chronic constipation and has 1 or 2 small bowel movements weekly.  Patient admitted for pancreatitis, placed on IV fluids, supportive care, pain medication.  GI consulted.   Assessment & Plan:   Active Problems:   Type 2 diabetes mellitus with diabetic neuropathy (HCC)   Essential hypertension   Chronic pancreatitis (HCC)   GERD (gastroesophageal reflux disease)   Acute pancreatitis   Chronic constipation   #1 acute on chronic pancreatitis -Patient with presentation with a 3-week history of intermittent gradually worsening abdominal discomfort nausea noted to be in acute on chronic pancreatitis. -Patient with history of celiac plexus block in 2017. -CT abdomen and pelvis done without necrosis or fluid collection, also some associated duodenitis felt likely reactive to pancreatitis. - Etiology of pancreatitis unclear. - Follow-up could likely be secondary to medication effect from Ozempic , ACE inhibitor etc. although patient has been on these for years without any complications. - Patient denies any ongoing alcohol  ingestion. - Triglyceride levels noted at 161. - Resume home regimen Lipitor. -Placed on IV PPI twice daily due to concern for possible duodenitis. - Patient with clinical improvement. - Patient seen by GI and diet advanced to a full liquid diet. - Continue IV fluid hydration, IV antiemetics, IV pain meds, supportive care.  2.  Chronic constipation -Patient with no bowel movement - Smog enema ordered however patient  declined. - MiraLAX  changed to 3 times daily for constipation per GI. - Increase Senokot to 2 tablets twice daily.  3.  Diabetes mellitus type 2 -Hemoglobin A1c 6.9.(01/24/2024) - POC hemoglobin A1c 7.4 (05/17/2024) - CBG noted at 139 this morning. - SSI.  4.  Narcotic dependence -Continue current pain regimen of oxycodone  and IV Dilaudid  as needed while in house. - Could likely resume methadone on discharge.  5.  Hypertension -Continue home regimen Norvasc  5 mg daily, HCTZ 25 mg daily. - IV hydralazine  as needed.  6.  GERD -IV PPI.  7.  Hypomagnesemia -Replete.    DVT prophylaxis: Lovenox  Code Status: Full Family Communication: Updated patient.  No family at bedside. Disposition: Home when clinically improved.  Status is: Inpatient Remains inpatient appropriate because: Severity of illness   Consultants:  GI: Dr. Elicia 07/23/2024  Procedures:  CT abdomen pelvis 07/22/2024   Antimicrobials:  Anti-infectives (From admission, onward)    None         Subjective: Patient sleeping but easily arousable.  Denies any chest pain or shortness of breath.  Still with epigastric abdominal pain but improving.  Tolerated clear liquids.  States his diet was just advanced by gastroenterologist.  Passing flatus.  No bowel movement.  Patient noted to have refused smog enema this morning.  Objective: Vitals:   07/23/24 0144 07/23/24 0403 07/23/24 1008 07/23/24 1343  BP: 133/88 112/73 (!) 169/90 (!) 157/104  Pulse: 82 95 92 95  Resp: 15 17 16 16   Temp: 97.7 F (36.5 C) 98.6 F (37 C) 97.7 F (36.5 C) (!) 97.5 F (36.4 C)  TempSrc:   Oral Oral  SpO2: 97% 99% 97% 94%  Weight:  Height:        Intake/Output Summary (Last 24 hours) at 07/23/2024 1640 Last data filed at 07/23/2024 1344 Gross per 24 hour  Intake 2152.35 ml  Output 2650 ml  Net -497.65 ml   Filed Weights   07/22/24 1515  Weight: 93 kg    Examination:  General exam: NAD. Respiratory  system: Lungs clear to auscultation bilaterally.  No wheezes, no crackles, no rhonchi.  Fair air movement.  Speaking full sentences.  Cardiovascular system: Regular rate and rhythm no murmurs rubs or gallops.  No JVD.  No lower extremity edema. Gastrointestinal system: Abdomen is soft, nondistended, some tenderness to palpation in the epigastrium.  Positive bowel sounds.  No rebound.  No guarding.   Central nervous system: Alert and oriented. No focal neurological deficits. Extremities: Status post left BKA.   Skin: No rashes, lesions or ulcers Psychiatry: Judgement and insight appear normal. Mood & affect appropriate.     Data Reviewed: I have personally reviewed following labs and imaging studies  CBC: Recent Labs  Lab 07/22/24 0725 07/23/24 0443  WBC 6.5 5.4  NEUTROABS 4.0  --   HGB 14.5 14.4  HCT 41.3 42.8  MCV 86.6 89.5  PLT 231 229    Basic Metabolic Panel: Recent Labs  Lab 07/22/24 0725 07/23/24 0443  NA 138 139  K 3.9 3.6  CL 101 105  CO2 24 21*  GLUCOSE 159* 115*  BUN 13 8  CREATININE 0.83 0.64  CALCIUM  10.1 9.6  MG  --  1.5*    GFR: Estimated Creatinine Clearance: 119.3 mL/min (by C-G formula based on SCr of 0.64 mg/dL).  Liver Function Tests: Recent Labs  Lab 07/22/24 0725  AST 22  ALT 29  ALKPHOS 62  BILITOT 0.6  PROT 7.4  ALBUMIN 4.4    CBG: Recent Labs  Lab 07/22/24 2317 07/23/24 0400 07/23/24 0744 07/23/24 1131 07/23/24 1613  GLUCAP 113* 133* 139* 159* 83     No results found for this or any previous visit (from the past 240 hours).       Radiology Studies: CT ABDOMEN PELVIS W CONTRAST Result Date: 07/22/2024 CLINICAL DATA:  Abdominal pain, acute, nonlocalized Bowel obstruction suspected EXAM: CT ABDOMEN AND PELVIS WITH CONTRAST TECHNIQUE: Multidetector CT imaging of the abdomen and pelvis was performed using the standard protocol following bolus administration of intravenous contrast. RADIATION DOSE REDUCTION: This exam was  performed according to the departmental dose-optimization program which includes automated exposure control, adjustment of the mA and/or kV according to patient size and/or use of iterative reconstruction technique. CONTRAST:  OMNIPAQUE  IOHEXOL  300 MG/ML  SOLN COMPARISON:  January 09, 2018. FINDINGS: Lower chest: There is a 5 mm RIGHT lower lobe pulmonary nodule (series 3, image 9). Unchanged subpleural pulmonary nodule/scarring the LEFT lower lobe since 2019, consistent with a benign etiology (series 3, image 10). Hepatobiliary: Gallbladder is distended but unremarkable. Mildly lobular contours of the LEFT liver without focal hepatic abnormality. No intrahepatic or extrahepatic biliary ductal dilation. Pancreas: There is some fat stranding adjacent to the uncinate process and head of the pancreas along the duodenal sweep. There is a revisualization of a hypodense mass of the tail of the pancreas measuring 11 mm, similar compared to 2019 (series 2, image 23; series 4, image 60). Spleen: Unremarkable. Adrenals/Urinary Tract: Adrenal glands are plump without focal nodularity. Kidneys enhance symmetrically with lobulated contours. No hydronephrosis. Fluid density 17 mm mass of the RIGHT kidney most consistent with benign cyst (for which no dedicated  imaging follow-up is recommended). No obstructing nephrolithiasis. Bladder is unremarkable. Stomach/Bowel: No evidence of bowel obstruction. Moderate to large colonic stool burden diffusely throughout the colon. Prominence of the second and third portion of the duodenal walls with adjacent fat stranding, favored reactive. Appendix is normal. Vascular/Lymphatic: Atherosclerotic calcifications of the nonaneurysmal abdominal aorta. Prominent LEFT periaortic lymph node at the level of the kidney measuring 11 mm in the short axis, similar compared to remote prior. Reproductive: Coarse calcifications of the prostate. Other: Postsurgical changes of the lower abdominal wall with  scarring extending along the anterior margin of the bladder. Musculoskeletal: Postsurgical changes of the lower lumbar spine with severe degenerative changes at L2-3. Spinal stimulator. IMPRESSION: 1. There is some fat stranding adjacent to the uncinate process and head of the pancreas along the duodenal sweep. This is favored to reflect pancreatitis with reactive duodenitis. Recommend correlation with lipase levels. Should this be recurrent in etiology, this could reflect groove pancreatitis. 2. There is a 5 mm RIGHT lower lobe pulmonary nodule. No follow-up needed if patient is low-risk.This recommendation follows the consensus statement: Guidelines for Management of Incidental Pulmonary Nodules Detected on CT Images: From the Fleischner Society 2017; Radiology 2017; 284:228-243. 3. Moderate to large colonic stool burden diffusely throughout the colon. 4. There is a 11 mm hypodense mass of the tail of the pancreas, similar compared to 2019. 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 Recommend follow-up MRI/MRCP in 2 years. Aortic Atherosclerosis (ICD10-I70.0). Electronically Signed   By: Corean Salter M.D.   On: 07/22/2024 08:45        Scheduled Meds:  amLODipine   5 mg Oral QHS   atorvastatin   40 mg Oral Daily   enoxaparin  (LOVENOX ) injection  40 mg Subcutaneous Q24H   gabapentin  600 mg Oral TID   hydrochlorothiazide   25 mg Oral Daily   insulin  aspart  0-15 Units Subcutaneous Q4H   pantoprazole  (PROTONIX ) IV  40 mg Intravenous Q12H   polyethylene glycol  17 g Oral TID   senna-docusate  2 tablet Oral BID   Continuous Infusions:  lactated ringers  200 mL/hr at 07/23/24 1013     LOS: 1 day    Time spent: 40 minutes    Toribio Hummer, MD [facility]   To contact the attending provider between 7A-7P or the covering provider during after hours 7P-7A, please log into the web site [email or web address] and access using universal [place] password for that web site. If you do  not have the password, please call the hospital operator.  07/23/2024, 4:40 PM

## 2024-07-23 NOTE — Progress Notes (Signed)
 Pt c/o nausea and vomiting. Administered PRN IV zofran . Patient start complaining itching on his left arm and burning sensation after administered Zofran . Also noticed rashes on left arm. Notified to on call NP Blondie. Received order for benadryl . Benadryl  given to patient. Patient said that his itching is less now. But still have rashes on his left arm. Will continue to monitor.
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
## 2024-07-23 NOTE — Progress Notes (Signed)
   07/23/24 0854  TOC Brief Assessment  Insurance and Status Reviewed  Patient has primary care physician Yes  Home environment has been reviewed Apartment  Prior level of function: Independent  Prior/Current Home Services No current home services  Social Drivers of Health Review SDOH reviewed no interventions necessary  Readmission risk has been reviewed Yes  Transition of care needs no transition of care needs at this time    Signed: Heather Saltness, MSW, LCSW Clinical Social Worker Inpatient Care Management 07/23/2024 8:55 AM

## 2024-07-23 NOTE — Plan of Care (Signed)

## 2024-07-31 ENCOUNTER — Encounter: Payer: Self-pay | Admitting: Nurse Practitioner

## 2024-08-31 ENCOUNTER — Encounter: Payer: Self-pay | Admitting: Pharmacist

## 2024-08-31 NOTE — Progress Notes (Signed)
 This patient is appearing on a report for being at risk of failing the adherence measure for diabetes medications this calendar year.   Medication: Ozempic  (semaglutide ), metformin  1000 mg  Last fill date metformin : 06/03/24 for 90 day supply. Will recheck adherence / follow up with patient at next refill due 09/01/24 Last fill date Ozempic  (semaglutide ): 06/14/24 for 28 days supply. Patient admitted for acute pancreatitis 07/22/24.  Completed chart review.

## 2024-09-14 ENCOUNTER — Encounter: Payer: Self-pay | Admitting: Pharmacist

## 2024-09-14 NOTE — Progress Notes (Signed)
 This patient is appearing on a report for being at risk of failing the adherence measure for diabetes medications this calendar year.   Medication: metformin  Last fill date: 06/03/2024 for 90 day supply  Appears at goal - Pass for 2025 at 91% adherence without break in schedule.   Likely needs PCP follow-up for pancreatitis admiission 07/2024  PCP visit schedule requested by FM admin support.

## 2024-09-18 ENCOUNTER — Ambulatory Visit: Admitting: Nurse Practitioner

## 2024-09-20 ENCOUNTER — Encounter: Payer: Self-pay | Admitting: Pharmacist

## 2024-09-20 NOTE — Progress Notes (Signed)
 This patient is appearing on a report for being at risk of failing the adherence measure for diabetes medications this calendar year.   Medication: metformin  Last fill date: 09/16/24 for 90 day supply  Reviewed medication indication, dosing, and goals of therapy.

## 2024-10-12 ENCOUNTER — Other Ambulatory Visit (HOSPITAL_COMMUNITY): Payer: Self-pay

## 2024-10-14 ENCOUNTER — Emergency Department (HOSPITAL_BASED_OUTPATIENT_CLINIC_OR_DEPARTMENT_OTHER)
Admission: EM | Admit: 2024-10-14 | Discharge: 2024-10-14 | Disposition: A | Discharge status: Home / Self Care | Attending: Emergency Medicine | Admitting: Emergency Medicine

## 2024-10-14 ENCOUNTER — Encounter (HOSPITAL_BASED_OUTPATIENT_CLINIC_OR_DEPARTMENT_OTHER): Payer: Self-pay

## 2024-10-14 ENCOUNTER — Other Ambulatory Visit: Payer: Self-pay

## 2024-10-14 DIAGNOSIS — Z794 Long term (current) use of insulin: Secondary | ICD-10-CM | POA: Diagnosis not present

## 2024-10-14 DIAGNOSIS — S161XXA Strain of muscle, fascia and tendon at neck level, initial encounter: Secondary | ICD-10-CM | POA: Diagnosis not present

## 2024-10-14 DIAGNOSIS — Y92410 Unspecified street and highway as the place of occurrence of the external cause: Secondary | ICD-10-CM | POA: Diagnosis not present

## 2024-10-14 DIAGNOSIS — S39012A Strain of muscle, fascia and tendon of lower back, initial encounter: Secondary | ICD-10-CM | POA: Insufficient documentation

## 2024-10-14 DIAGNOSIS — S199XXA Unspecified injury of neck, initial encounter: Secondary | ICD-10-CM | POA: Diagnosis present

## 2024-10-14 MED ORDER — KETOROLAC TROMETHAMINE 60 MG/2ML IM SOLN
60.0000 mg | Freq: Once | INTRAMUSCULAR | Status: AC
  Administered 2024-10-14: 60 mg via INTRAMUSCULAR
  Filled 2024-10-14: qty 2

## 2024-10-14 MED ORDER — METHOCARBAMOL 500 MG PO TABS: 500.0000 mg | ORAL_TABLET | Freq: Three times a day (TID) | ORAL | 0 refills | Status: AC | PRN

## 2024-10-14 MED ORDER — MELOXICAM 15 MG PO TABS: 15.0000 mg | ORAL_TABLET | Freq: Every day | ORAL | 0 refills | Status: DC
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
## 2024-10-16 ENCOUNTER — Encounter: Payer: Self-pay | Admitting: Family Medicine

## 2024-10-16 ENCOUNTER — Ambulatory Visit: Admitting: Family Medicine

## 2024-10-16 VITALS — BP 139/88 | HR 85 | Ht 73.0 in | Wt 201.2 lb

## 2024-10-16 DIAGNOSIS — E11649 Type 2 diabetes mellitus with hypoglycemia without coma: Secondary | ICD-10-CM

## 2024-10-16 DIAGNOSIS — I10 Essential (primary) hypertension: Secondary | ICD-10-CM | POA: Diagnosis not present

## 2024-10-16 DIAGNOSIS — E1140 Type 2 diabetes mellitus with diabetic neuropathy, unspecified: Secondary | ICD-10-CM

## 2024-10-16 DIAGNOSIS — R7989 Other specified abnormal findings of blood chemistry: Secondary | ICD-10-CM

## 2024-10-16 LAB — POCT GLYCOSYLATED HEMOGLOBIN (HGB A1C): HbA1c, POC (controlled diabetic range): 6.7 % (ref 0.0–7.0)

## 2024-10-16 MED ORDER — IBUPROFEN 600 MG PO TABS: 600.0000 mg | ORAL_TABLET | Freq: Three times a day (TID) | ORAL | 0 refills | Status: AC | PRN

## 2024-10-16 MED ORDER — AMLODIPINE BESYLATE 5 MG PO TABS: 5.0000 mg | ORAL_TABLET | Freq: Every day | ORAL | 3 refills | Status: AC

## 2024-10-16 MED ORDER — LISINOPRIL 40 MG PO TABS: ORAL_TABLET | ORAL | 3 refills | Status: AC

## 2024-10-16 MED ORDER — EMPAGLIFLOZIN 25 MG PO TABS: 25.0000 mg | ORAL_TABLET | Freq: Every day | ORAL | 3 refills | Status: AC

## 2024-10-16 NOTE — Assessment & Plan Note (Signed)
 A1C today 6.7%, at goal.  - UACR - continue jardiance  25 mg and metformin  1000mg  BID
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
## 2024-10-16 NOTE — Assessment & Plan Note (Signed)
 Elevated today, though much improved from 160 SBP to 138 SBP on recheck. Patient has not been taking norvasc  d/t not having refills. - restart norvasc  5 mg - continue lisinopril  40 mg

## 2024-10-17 ENCOUNTER — Ambulatory Visit
Admission: RE | Admit: 2024-10-17 | Discharge: 2024-10-17 | Disposition: A | Discharge status: Home / Self Care | Source: Ambulatory Visit | Attending: Family Medicine | Admitting: Family Medicine

## 2024-10-17 ENCOUNTER — Ambulatory Visit: Payer: Self-pay | Admitting: Family Medicine

## 2024-10-17 DIAGNOSIS — Z981 Arthrodesis status: Secondary | ICD-10-CM

## 2024-10-17 DIAGNOSIS — R7989 Other specified abnormal findings of blood chemistry: Secondary | ICD-10-CM

## 2024-10-17 LAB — MICROALBUMIN / CREATININE URINE RATIO
Creatinine, Urine: 76.2 mg/dL
Microalb/Creat Ratio: 30 mg/g{creat} — ABNORMAL HIGH (ref 0–29)
Microalbumin, Urine: 22.7 ug/mL

## 2024-10-17 LAB — TESTOSTERONE: Testosterone: 111 ng/dL — ABNORMAL LOW (ref 264–916)

## 2024-10-18 MED ORDER — TESTOSTERONE 20.25 MG/ACT (1.62%) TD GEL: 4.0000 | Freq: Every morning | TRANSDERMAL | 0 refills | Status: DC

## 2024-10-22 NOTE — Telephone Encounter (Signed)
 Awaiting results from lumbar xray. Given history of lumbar fusion, will order PT to avoid delay in care. Will discuss coverage of diabetes medications with pharmacy team.

## 2024-10-24 ENCOUNTER — Other Ambulatory Visit (HOSPITAL_COMMUNITY): Payer: Self-pay

## 2024-10-29 ENCOUNTER — Ambulatory Visit: Admitting: Nurse Practitioner

## 2024-10-30 ENCOUNTER — Ambulatory Visit: Payer: Self-pay | Admitting: Family Medicine

## 2024-10-30 ENCOUNTER — Encounter: Payer: Self-pay | Admitting: Family Medicine

## 2024-10-30 DIAGNOSIS — E1140 Type 2 diabetes mellitus with diabetic neuropathy, unspecified: Secondary | ICD-10-CM

## 2024-10-30 DIAGNOSIS — F419 Anxiety disorder, unspecified: Secondary | ICD-10-CM | POA: Diagnosis not present

## 2024-10-30 DIAGNOSIS — R7989 Other specified abnormal findings of blood chemistry: Secondary | ICD-10-CM | POA: Diagnosis not present

## 2024-10-30 MED ORDER — KETOROLAC TROMETHAMINE 60 MG/2ML IM SOLN
60.0000 mg | Freq: Once | INTRAMUSCULAR | Status: AC
  Administered 2024-10-30: 60 mg via INTRAMUSCULAR

## 2024-10-30 MED ORDER — TESTOSTERONE 20.25 MG/ACT (1.62%) TD GEL: 4.0000 | Freq: Every morning | TRANSDERMAL | 0 refills | Status: AC
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
## 2024-10-30 NOTE — Assessment & Plan Note (Signed)
 Last A1c checked on 10/16/2024 was 6.7.  Patient currently taking metformin  1 g twice daily and was on Jardiance .  However reports that Jardiance  is gone too expensive.  Discussed this with pharmacy team, who mailed him application for assistance.  He is currently waiting for that valuation to arrive.  Discussed that if it is too expensive to afford Jardiance  with the assistance, will trial another tier 1 medication on his formulary.

## 2024-11-15 ENCOUNTER — Other Ambulatory Visit (HOSPITAL_COMMUNITY): Payer: Self-pay

## 2024-11-16 ENCOUNTER — Other Ambulatory Visit (HOSPITAL_COMMUNITY): Payer: Self-pay

## 2024-11-16 NOTE — Telephone Encounter (Signed)
 NEW PAP: Patient assistance application for Jardiance  through [employer]) has been mailed to pt's home address on file.

## 2024-12-21 ENCOUNTER — Ambulatory Visit: Payer: Self-pay | Admitting: Family Medicine
